# Patient Record
Sex: Male | Born: 1960 | Race: White | Hispanic: No | Marital: Single | Smoking: Current every day smoker
Health system: Southern US, Community
[De-identification: ages and names within clinical notes are randomized; demographics above are authoritative.]

## PROBLEM LIST (undated history)

## (undated) DIAGNOSIS — R0600 Dyspnea, unspecified: Secondary | ICD-10-CM

## (undated) DIAGNOSIS — F32A Depression, unspecified: Secondary | ICD-10-CM

## (undated) DIAGNOSIS — G709 Myoneural disorder, unspecified: Secondary | ICD-10-CM

## (undated) DIAGNOSIS — R0609 Other forms of dyspnea: Secondary | ICD-10-CM

## (undated) DIAGNOSIS — F419 Anxiety disorder, unspecified: Secondary | ICD-10-CM

## (undated) DIAGNOSIS — Z720 Tobacco use: Secondary | ICD-10-CM

## (undated) DIAGNOSIS — E785 Hyperlipidemia, unspecified: Secondary | ICD-10-CM

## (undated) DIAGNOSIS — G5600 Carpal tunnel syndrome, unspecified upper limb: Secondary | ICD-10-CM

## (undated) DIAGNOSIS — I2510 Atherosclerotic heart disease of native coronary artery without angina pectoris: Secondary | ICD-10-CM

## (undated) DIAGNOSIS — C801 Malignant (primary) neoplasm, unspecified: Secondary | ICD-10-CM

## (undated) DIAGNOSIS — M1990 Unspecified osteoarthritis, unspecified site: Secondary | ICD-10-CM

## (undated) DIAGNOSIS — F329 Major depressive disorder, single episode, unspecified: Secondary | ICD-10-CM

## (undated) DIAGNOSIS — R7303 Prediabetes: Secondary | ICD-10-CM

## (undated) DIAGNOSIS — F988 Other specified behavioral and emotional disorders with onset usually occurring in childhood and adolescence: Secondary | ICD-10-CM

## (undated) DIAGNOSIS — J449 Chronic obstructive pulmonary disease, unspecified: Secondary | ICD-10-CM

## (undated) HISTORY — DX: Atherosclerotic heart disease of native coronary artery without angina pectoris: I25.10

## (undated) HISTORY — PX: COLONOSCOPY: SHX174

## (undated) HISTORY — DX: Myoneural disorder, unspecified: G70.9

## (undated) HISTORY — DX: Other forms of dyspnea: R06.09

## (undated) HISTORY — PX: CHOLECYSTECTOMY: SHX55

## (undated) HISTORY — DX: Malignant (primary) neoplasm, unspecified: C80.1

## (undated) HISTORY — DX: Carpal tunnel syndrome, unspecified upper limb: G56.00

## (undated) HISTORY — DX: Hyperlipidemia, unspecified: E78.5

## (undated) HISTORY — DX: Tobacco use: Z72.0

---

## 1998-02-08 ENCOUNTER — Emergency Department (HOSPITAL_COMMUNITY): Admission: EM | Admit: 1998-02-08 | Discharge: 1998-02-08 | Payer: Self-pay | Admitting: *Deleted

## 1998-07-18 ENCOUNTER — Emergency Department (HOSPITAL_COMMUNITY): Admission: EM | Admit: 1998-07-18 | Discharge: 1998-07-18 | Payer: Self-pay | Admitting: Emergency Medicine

## 1999-02-13 ENCOUNTER — Ambulatory Visit (HOSPITAL_BASED_OUTPATIENT_CLINIC_OR_DEPARTMENT_OTHER): Admission: RE | Admit: 1999-02-13 | Discharge: 1999-02-13 | Payer: Self-pay | Admitting: Plastic Surgery

## 1999-04-30 ENCOUNTER — Ambulatory Visit (HOSPITAL_COMMUNITY): Admission: RE | Admit: 1999-04-30 | Discharge: 1999-04-30 | Payer: Self-pay | Admitting: Gastroenterology

## 1999-06-20 ENCOUNTER — Encounter: Admission: RE | Admit: 1999-06-20 | Discharge: 1999-06-20 | Payer: Self-pay | Admitting: Family Medicine

## 1999-06-20 ENCOUNTER — Encounter: Payer: Self-pay | Admitting: Family Medicine

## 1999-07-11 ENCOUNTER — Ambulatory Visit: Admission: RE | Admit: 1999-07-11 | Discharge: 1999-07-11 | Payer: Self-pay | Admitting: Pulmonary Disease

## 2001-07-29 HISTORY — PX: GALLBLADDER SURGERY: SHX652

## 2003-05-17 ENCOUNTER — Encounter: Payer: Self-pay | Admitting: Emergency Medicine

## 2003-05-17 ENCOUNTER — Emergency Department (HOSPITAL_COMMUNITY): Admission: EM | Admit: 2003-05-17 | Discharge: 2003-05-18 | Payer: Self-pay | Admitting: Emergency Medicine

## 2003-11-01 ENCOUNTER — Encounter: Admission: RE | Admit: 2003-11-01 | Discharge: 2003-11-01 | Payer: Self-pay | Admitting: Specialist

## 2004-09-04 ENCOUNTER — Encounter: Admission: RE | Admit: 2004-09-04 | Discharge: 2004-09-04 | Payer: Self-pay | Admitting: Family Medicine

## 2004-10-30 ENCOUNTER — Ambulatory Visit (HOSPITAL_COMMUNITY): Admission: RE | Admit: 2004-10-30 | Discharge: 2004-10-30 | Payer: Self-pay | Admitting: Surgery

## 2013-01-31 ENCOUNTER — Encounter (HOSPITAL_COMMUNITY): Payer: Self-pay | Admitting: Emergency Medicine

## 2013-01-31 ENCOUNTER — Emergency Department (HOSPITAL_COMMUNITY)
Admission: EM | Admit: 2013-01-31 | Discharge: 2013-02-01 | Disposition: A | Discharge status: Other Acute Inpatient Hospital | Payer: No Typology Code available for payment source | Attending: Emergency Medicine | Admitting: Emergency Medicine

## 2013-01-31 DIAGNOSIS — F3130 Bipolar disorder, current episode depressed, mild or moderate severity, unspecified: Secondary | ICD-10-CM

## 2013-01-31 DIAGNOSIS — F1110 Opioid abuse, uncomplicated: Secondary | ICD-10-CM | POA: Insufficient documentation

## 2013-01-31 DIAGNOSIS — T394X2A Poisoning by antirheumatics, not elsewhere classified, intentional self-harm, initial encounter: Secondary | ICD-10-CM

## 2013-01-31 DIAGNOSIS — F19239 Other psychoactive substance dependence with withdrawal, unspecified: Secondary | ICD-10-CM

## 2013-01-31 DIAGNOSIS — F17200 Nicotine dependence, unspecified, uncomplicated: Secondary | ICD-10-CM | POA: Insufficient documentation

## 2013-01-31 DIAGNOSIS — F32A Depression, unspecified: Secondary | ICD-10-CM

## 2013-01-31 DIAGNOSIS — R509 Fever, unspecified: Secondary | ICD-10-CM | POA: Insufficient documentation

## 2013-01-31 DIAGNOSIS — T398X2A Poisoning by other nonopioid analgesics and antipyretics, not elsewhere classified, intentional self-harm, initial encounter: Secondary | ICD-10-CM

## 2013-01-31 DIAGNOSIS — R109 Unspecified abdominal pain: Secondary | ICD-10-CM | POA: Insufficient documentation

## 2013-01-31 DIAGNOSIS — F329 Major depressive disorder, single episode, unspecified: Secondary | ICD-10-CM | POA: Insufficient documentation

## 2013-01-31 DIAGNOSIS — F3289 Other specified depressive episodes: Secondary | ICD-10-CM | POA: Insufficient documentation

## 2013-01-31 DIAGNOSIS — M549 Dorsalgia, unspecified: Secondary | ICD-10-CM | POA: Insufficient documentation

## 2013-01-31 DIAGNOSIS — R197 Diarrhea, unspecified: Secondary | ICD-10-CM | POA: Insufficient documentation

## 2013-01-31 DIAGNOSIS — T401X4A Poisoning by heroin, undetermined, initial encounter: Secondary | ICD-10-CM

## 2013-01-31 DIAGNOSIS — F1120 Opioid dependence, uncomplicated: Secondary | ICD-10-CM

## 2013-01-31 DIAGNOSIS — R45851 Suicidal ideations: Secondary | ICD-10-CM | POA: Insufficient documentation

## 2013-01-31 DIAGNOSIS — R110 Nausea: Secondary | ICD-10-CM | POA: Insufficient documentation

## 2013-01-31 HISTORY — DX: Depression, unspecified: F32.A

## 2013-01-31 HISTORY — DX: Major depressive disorder, single episode, unspecified: F32.9

## 2013-01-31 LAB — CBC WITH DIFFERENTIAL/PLATELET
Basophils Absolute: 0 10*3/uL (ref 0.0–0.1)
Eosinophils Relative: 0 % (ref 0–5)
Lymphocytes Relative: 20 % (ref 12–46)
MCV: 85.5 fL (ref 78.0–100.0)
Neutro Abs: 8.4 10*3/uL — ABNORMAL HIGH (ref 1.7–7.7)
Neutrophils Relative %: 73 % (ref 43–77)
Platelets: 380 10*3/uL (ref 150–400)
RDW: 13.5 % (ref 11.5–15.5)
WBC: 11.4 10*3/uL — ABNORMAL HIGH (ref 4.0–10.5)

## 2013-01-31 LAB — BASIC METABOLIC PANEL
CO2: 24 mEq/L (ref 19–32)
Calcium: 9.7 mg/dL (ref 8.4–10.5)
Potassium: 3.3 mEq/L — ABNORMAL LOW (ref 3.5–5.1)
Sodium: 133 mEq/L — ABNORMAL LOW (ref 135–145)

## 2013-01-31 LAB — ETHANOL: Alcohol, Ethyl (B): <11 mg/dL (ref 0–11)

## 2013-01-31 MED ORDER — LOPERAMIDE HCL 2 MG PO CAPS: 2.0000 mg | ORAL_CAPSULE | ORAL | Status: DC | PRN

## 2013-01-31 MED ORDER — LOPERAMIDE HCL 2 MG PO CAPS
2.0000 mg | ORAL_CAPSULE | Freq: Once | ORAL | Status: AC
  Administered 2013-01-31: 2 mg via ORAL
  Filled 2013-01-31: qty 1

## 2013-01-31 MED ORDER — DICYCLOMINE HCL 10 MG PO CAPS
10.0000 mg | ORAL_CAPSULE | Freq: Once | ORAL | Status: AC
  Administered 2013-01-31: 10 mg via ORAL
  Filled 2013-01-31: qty 1

## 2013-01-31 MED ORDER — CLONIDINE HCL 0.1 MG PO TABS: 0.1000 mg | ORAL_TABLET | ORAL | Status: DC

## 2013-01-31 MED ORDER — HYDROXYZINE HCL 25 MG PO TABS
25.0000 mg | ORAL_TABLET | Freq: Four times a day (QID) | ORAL | Status: DC | PRN
  Administered 2013-01-31 – 2013-02-01 (×2): 25 mg via ORAL
  Filled 2013-01-31 (×2): qty 1

## 2013-01-31 MED ORDER — CLONIDINE HCL 0.1 MG PO TABS
0.1000 mg | ORAL_TABLET | Freq: Once | ORAL | Status: AC
  Administered 2013-01-31: 0.1 mg via ORAL
  Filled 2013-01-31: qty 1

## 2013-01-31 MED ORDER — NAPROXEN 500 MG PO TABS
500.0000 mg | ORAL_TABLET | Freq: Two times a day (BID) | ORAL | Status: DC | PRN
  Filled 2013-01-31: qty 1

## 2013-01-31 MED ORDER — ONDANSETRON 4 MG PO TBDP
4.0000 mg | ORAL_TABLET | Freq: Four times a day (QID) | ORAL | Status: DC | PRN
  Administered 2013-02-01 (×2): 4 mg via ORAL
  Filled 2013-01-31 (×2): qty 1

## 2013-01-31 MED ORDER — METHOCARBAMOL 500 MG PO TABS
500.0000 mg | ORAL_TABLET | Freq: Three times a day (TID) | ORAL | Status: DC | PRN
  Administered 2013-01-31 – 2013-02-01 (×2): 500 mg via ORAL
  Filled 2013-01-31 (×2): qty 1

## 2013-01-31 MED ORDER — POTASSIUM CHLORIDE CRYS ER 20 MEQ PO TBCR
30.0000 meq | EXTENDED_RELEASE_TABLET | Freq: Once | ORAL | Status: AC
  Administered 2013-01-31: 30 meq via ORAL
  Filled 2013-01-31: qty 1.5
  Filled 2013-01-31: qty 1

## 2013-01-31 MED ORDER — CLONIDINE HCL 0.1 MG PO TABS
0.1000 mg | ORAL_TABLET | Freq: Every day | ORAL | Status: DC
  Administered 2013-02-01: 0.1 mg via ORAL

## 2013-01-31 MED ORDER — DICYCLOMINE HCL 20 MG PO TABS
20.0000 mg | ORAL_TABLET | Freq: Four times a day (QID) | ORAL | Status: DC | PRN
  Administered 2013-02-01 (×3): 20 mg via ORAL
  Filled 2013-01-31 (×4): qty 1

## 2013-01-31 MED ORDER — CLONIDINE HCL 0.1 MG PO TABS
0.1000 mg | ORAL_TABLET | Freq: Four times a day (QID) | ORAL | Status: DC
  Administered 2013-01-31 – 2013-02-01 (×3): 0.1 mg via ORAL
  Filled 2013-01-31 (×4): qty 1

## 2013-01-31 NOTE — [facility] Counselor (Addendum)
Pt has given verbal consent for pt's sister Verl Blalock to be kept apprised of his disposition. Her cell is [phone]. Sister asked that staff call her when pt disposition set.   Writer answered sister's questions re: heroin addiction, bipolar disorder and possible course of treatment for pt.   Evette Cristal, [place] Assessment Counselor

## 2013-01-31 NOTE — ED Notes (Addendum)
Pt from home via [facility] c/o withdrawals from IV heroin. Pt last used approx 2-3 days ago. Pt states that he is trying to quit "cold [place]". Pt reports dry mouth, incontinent of stool, generalized body aches, tremors, chills. Pt requesting help with detox. Pt A&O and in NAD. VSS

## 2013-01-31 NOTE — ED Provider Notes (Signed)
Medical screening examination/treatment/procedure(s) were performed by non-physician practitioner and as supervising physician I was immediately available for consultation/collaboration.  Ethelda Chick, MD 01/31/13 [phone]

## 2013-01-31 NOTE — ED Provider Notes (Signed)
History  This chart was scribed for  by Ladona Ridgel Day, ED scribe. This patient was seen in room [facility]TR1/[facility]PT1 and the patient's care was started at 1548.  CSN: [id number] Arrival date & time 01/31/13  1526  First MD Initiated Contact with Patient 01/31/13 1548     Chief Complaint  Patient presents with  . Drug Problem   Patient is a 52 y.o. male presenting with drug problem. The history is provided by the patient. No language interpreter was used.  Drug Problem This is a chronic problem. The current episode started more than 1 week ago. The problem occurs constantly. The problem has not changed since onset.Associated symptoms include abdominal pain. Pertinent negatives include no chest pain and no shortness of breath. Nothing aggravates the symptoms. Nothing relieves the symptoms. He has tried nothing for the symptoms.  Drug Problem This is a chronic problem. The current episode started more than 1 week ago. The problem occurs constantly. The problem has not changed since onset.Associated symptoms include abdominal pain, chills, a fever and nausea. Pertinent negatives include no chest pain, coughing, neck pain, vomiting or weakness. Nothing aggravates the symptoms. He has tried nothing for the symptoms.   HPI Comments: Louis Zimmerman is a 52 y.o. male who presents to the Emergency Department requesting detox for heroin abuse and admits to using for the past year. He states past 3 days has been trying to quit on his own but with worsening withdrawal sx including nausea, diarrhea, chills, body aches, fever. He states has not tried to quit before and is now feeding depressed with suicidal ideations; he denies homicidal ideations. He denies hallucinations or hearing voices. He has no medical history.    Past Medical History  Diagnosis Date  . Depression    History reviewed. No pertinent past surgical history. No family history on file. History  Substance Use Topics  . Smoking status: Current  Every Day Smoker -- 1.00 packs/day    Types: Cigarettes  . Smokeless tobacco: Not on file  . Alcohol Use: No    Review of Systems  Constitutional: Positive for fever and chills.       Body aches from heroin withdrawal  HENT: Negative for neck pain.   Respiratory: Negative for cough and shortness of breath.   Cardiovascular: Negative for chest pain.  Gastrointestinal: Positive for nausea, abdominal pain and diarrhea. Negative for vomiting.  Musculoskeletal: Negative for back pain.  Skin: Negative for color change and pallor.  Neurological: Negative for syncope and weakness.  Psychiatric/Behavioral: Positive for suicidal ideas. Negative for hallucinations and self-injury. The patient is nervous/anxious.   All other systems reviewed and are negative.   A complete 10 system review of systems was obtained and all systems are negative except as noted in the HPI and PMH.   Allergies  Review of patient's allergies indicates no known allergies.  Home Medications  No current outpatient prescriptions on file. Triage Vitals: BP 121/73  Pulse 95  Temp(Src) 98.4 F (36.9 C) (Oral)  Resp 20  SpO2 99% Physical Exam  Nursing note and vitals reviewed. Constitutional: He is oriented to person, place, and time. He appears well-developed and well-nourished. No distress.  HENT:  Head: Normocephalic and atraumatic.  Eyes: EOM are normal.  Neck: Neck supple. No tracheal deviation present.  Cardiovascular: Normal rate.   Pulmonary/Chest: Effort normal. No respiratory distress.  Musculoskeletal: Normal range of motion.  Neurological: He is alert and oriented to person, place, and time.  Skin: Skin is warm  and dry.  Psychiatric: He is withdrawn. He is not actively hallucinating. Thought content is not paranoid and not delusional. He exhibits a depressed mood. He expresses suicidal ideation. He expresses no homicidal ideation. He expresses no suicidal plans and no homicidal plans.    ED Course   Procedures (including critical care time)  DIAGNOSTIC STUDIES: Oxygen Saturation is 99% on room air, normal by my interpretation.    COORDINATION OF CARE: At 357 Pm Discussed treatment plan with patient which includes anti anxiety medicines, imodium, robaxin. Patient agrees.   Labs Reviewed  CBC WITH DIFFERENTIAL  BASIC METABOLIC PANEL  URINE RAPID DRUG SCREEN (HOSP PERFORMED)  ETHANOL   No results found. 1. Heroin abuse     MDM  Discussed case with ACT. Holding orders placed. Opiate withdrawal protocol initiated. Home meds reviewed.. Labs drawn.   Dorthula Matas, PA-C 01/31/13 1605

## 2013-01-31 NOTE — ED Notes (Signed)
Pt asked to given urine specimen but can't void at present moment.

## 2013-01-31 NOTE — ED Notes (Signed)
Pt BIB EMS. Pt states he has used heroin every day for the past year. Pt states he stopped using 2 days ago. Pt c/o abd pain, diarrhea, fever and body aches over the past 3 or 4 days. Pt was ambulatory to ambulance with steady gait per EMS. Pt a/o x 4. No acute distress.

## 2013-01-31 NOTE — Consult Note (Signed)
Reason for Consult:ED admission for opiate  (heroin) W/D with hx attempted suicide by OD .  Referring Physician:Dr Zimmerman   Louis Zimmerman is an 52 y.o. male.  HPI:[name] admitted to ED in Heroin withdrawal s/p attempted OD 7/4 and subsequent cold [place].  8 yr hx opiate abuse/addiction to RX pain pills related to shoulder injury/chronic pains prior to past yr using heroin at suggestion of a friend who told him it was "better" than the pain pills.Pt used heroin iv-DID NOT SHARE NEEDLES BUT DID NOT CLEAN THEM IN BEWEEN DOSES.Sister he allowed to be present reports pt had dx of Bipolar DO in past with Louis Zimmerman whom pt has not seen in years. Pt reports he is depressed without any mania at this time. He states he was dx'd prior to his addiction.  Past Medical History  Diagnosis Date  . Depression     History reviewed. No pertinent past surgical history.  No family history on file.  Social History:  reports that he has been smoking Cigarettes.  He has been smoking about 1.00 pack per day. He does not have any smokeless tobacco history on file. He reports that he uses illicit drugs (IV) about 7 times per week. He reports that he does not drink alcohol.  Allergies: No Known Allergies  Medications: Prior to Admission:  (Not in a hospital admission)  Results for orders placed during the hospital encounter of 01/31/13 (from the past 48 hour(s))  CBC WITH DIFFERENTIAL     Status: Abnormal   Collection Time    01/31/13  4:27 PM      Result Value Range   WBC 11.4 (*) 4.0 - 10.5 K/uL   RBC 4.42  4.22 - 5.81 MIL/uL   Hemoglobin 13.2  13.0 - 17.0 g/dL   HCT 16.1 (*) 09.6 - 04.5 %   MCV 85.5  78.0 - 100.0 fL   MCH 29.9  26.0 - 34.0 pg   MCHC 34.9  30.0 - 36.0 g/dL   RDW 40.9  81.1 - 91.4 %   Platelets 380  150 - 400 K/uL   Neutrophils Relative % 73  43 - 77 %   Neutro Abs 8.4 (*) 1.7 - 7.7 K/uL   Lymphocytes Relative 20  12 - 46 %   Lymphs Abs 2.2  0.7 - 4.0 K/uL   Monocytes  Relative 7  3 - 12 %   Monocytes Absolute 0.8  0.1 - 1.0 K/uL   Eosinophils Relative 0  0 - 5 %   Eosinophils Absolute 0.0  0.0 - 0.7 K/uL   Basophils Relative 0  0 - 1 %   Basophils Absolute 0.0  0.0 - 0.1 K/uL  BASIC METABOLIC PANEL     Status: Abnormal   Collection Time    01/31/13  4:27 PM      Result Value Range   Sodium 133 (*) 135 - 145 mEq/L   Potassium 3.3 (*) 3.5 - 5.1 mEq/L   Chloride 97  96 - 112 mEq/L   CO2 24  19 - 32 mEq/L   Glucose, Bld 113 (*) 70 - 99 mg/dL   BUN 11  6 - 23 mg/dL   Creatinine, Ser 7.82  0.50 - 1.35 mg/dL   Calcium 9.7  8.4 - 95.6 mg/dL   GFR calc non Af Amer >90  >90 mL/min   GFR calc Af Amer >90  >90 mL/min   Comment:  The eGFR has been calculated     using the CKD EPI equation.     This calculation has not been     validated in all clinical     situations.     eGFR's persistently     <90 mL/min signify     possible Chronic Kidney Disease.  ETHANOL     Status: None   Collection Time    01/31/13  4:27 PM      Result Value Range   Alcohol, Ethyl (B) <11  0 - 11 mg/dL   Comment:            LOWEST DETECTABLE LIMIT FOR     SERUM ALCOHOL IS 11 mg/dL     FOR MEDICAL PURPOSES ONLY    No results found.  Review of Systems  Constitutional: Positive for chills, weight loss, malaise/fatigue and diaphoresis.  HENT:       Rhinorrhea  Eyes: Negative.        Pupils constricted  Respiratory: Negative.   Cardiovascular: Negative.   Gastrointestinal: Positive for nausea, abdominal pain and diarrhea. Negative for vomiting, blood in stool and melena.  Genitourinary: Negative.   Musculoskeletal: Positive for myalgias, back pain and joint pain. Negative for falls.       Reports he began using opiates with bilateral shoulder pain/ injury in distant past-increasing dosages over time and eventually buying them off street. A friend told him heroin would work better and was cheaper at the time. 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   Endo/Heme/Allergies: Negative.   Psychiatric/Behavioral: Positive for suicidal ideas and substance abuse. The patient is nervous/anxious.        IV heroin use -didd not share needles but did not clean needles in between use  Pt gives hx of bipolar disorder diagnosed before he began to use opiates.Reports depression.Reports suicide attempt by overdose July 4  Hasnt taken bipolar meds nor seen Louis Zimmerman in years-reports he lost his health insurance in 2008 due to economy    Blood pressure 121/68, pulse 66, temperature 98.3 F (36.8 C), temperature source Oral, resp. rate 20, SpO2 100.00%. Physical Exam  Constitutional: He is oriented to person, place, and time. He appears distressed.  Lying in bed in fetal position with c/o abdominal pains/chills on COWS opiate W/D protocol  HENT:  Head: Normocephalic and atraumatic.  Right Ear: External ear normal.  Left Ear: External ear normal.  Rhinorrhea  Eyes: Conjunctivae and EOM are normal. Pupils are equal, round, and reactive to light. Right eye exhibits no discharge. Left eye exhibits no discharge.  Pupils constricted  Neck: Normal range of motion. Neck supple.  Cardiovascular: Regular rhythm.   Tachycardic  Respiratory: Effort normal and breath sounds normal.  GI:  Cramping  Genitourinary:  Deferred  Musculoskeletal:  Generalized myalgias  Neurological: He is alert and oriented to person, place, and time. No cranial nerve deficit.  Skin: He is diaphoretic.  Piloerection presenet  Psychiatric:  Behavior- consistent with heroin withdrawal Orientation-x3 Affect-sick/sad/depressed Mood-Consistent with affect Perception-aware of his addiction-unaware of                                        solutions.Judgement is impaired especially  given his attempted OD                                      Thought-Comprehensible.Speech is slow /tone is soft COWS 15    Assessment/Plan: Axis I: Opiate dependence in acute withdrawal                                           Bipolar d/o with Major Depression and suicide attempt complicated by heroin addiction (substance induced mood DO)                                Axis II: Deferred                                Axis III: Hx of bilateral Shoulder injury with chronic pain                                            Hyponatremia/hypokalemia probably secondary to fluid loss from W/D (Diarrhea)                                            Elevated WBC-?etiology                                 Axis FA:OZHYQMVHQ/IONGEX stressors                                 Axis V: GAF 29 PLAN: Admit to [name] service when bed available              Stabilize fluids/electrolytes in ER              Monitor WBC/FU on source of elevation  Michale Weikel E 01/31/2013, 8:48 PM

## 2013-01-31 NOTE — BH Assessment (Signed)
Assessment Note   Louis Zimmerman is an 52 y.o. male.   Pt has been using heroin for 1 yr on a daily basis with the amount varying depending on what the pt could get.  Pt denies current SI/Hi and AVH.  Pt reports he tried to end life 3 days ago by OD but no one knew and there was no help sought.  Pt reports knowing that detox off Heroin was going to be hard and did not want to live through it.  Pt says "I failed and I guess I got to go through it now."  Pt is curled up on his side.  Pt COWS appears to be 15.  Pt has vomited, has chills, is restless, sensitive to light, tremulous, Pulse is 95, pt rocking back and forth.  Pt appears to be in a lot of discomfort.  Pt has detoxed self for 3 days prior to coming to ED.  MD do not believe pt needs medical admit.  However, [facility] detox won't accept pt in current state and by the time pt stabilizes he will benefit more from rehab than detox.  Pt reports depression and hx of depression.  Pt appears to have some hx of psych related issues.  Nurses indicate pt family may not know about heroin use.  They have asked about prescription pill abuse.  ED has not discussed with family due to confidentiality.  ACT asked pt if family knew he was using heroin and pt responded "Yes."   MSE by ACT:  No eye contact, loss of weight, shaking and restless, Ox2, high anxiety, tremulous, chills, vomiting, nausea.  Axis I: Bipolar, Depressed and Opiod Abuse & MDD Axis II: Deferred Axis III:  Past Medical History  Diagnosis Date  . Depression    Axis IV: other psychosocial or environmental problems and problems related to social environment Axis V: 41-50 serious symptoms  Past Medical History:  Past Medical History  Diagnosis Date  . Depression     History reviewed. No pertinent past surgical history.  Family History: No family history on file.  Social History:  reports that he has been smoking Cigarettes.  He has been smoking about 1.00 pack per day. He does  not have any smokeless tobacco history on file. He reports that he uses illicit drugs (IV) about 7 times per week. He reports that he does not drink alcohol.  Additional Social History:  Alcohol / Drug Use Pain Medications: na Prescriptions: na Over the Counter: na History of alcohol / drug use?: Yes Substance #1 Name of Substance 1: herion 1 - Age of First Use: 51 1 - Amount (size/oz): varies 1 - Frequency: daily 1 - Duration: 1 yr 1 - Last Use / Amount: 01-28-13  CIWA: CIWA-Ar BP: 121/73 mmHg Pulse Rate: 95 COWS: Clinical Opiate Withdrawal Scale (COWS) Resting Pulse Rate: Pulse Rate 81-100 Sweating: Subjective report of chills or flushing Restlessness: Frequent shifting or extraneous movements of legs/arms Pupil Size: Pupils possibly larger than normal for room light Bone or Joint Aches: Patient reports sever diffuse aching of joints/muscles Runny Nose or Tearing: Not present GI Upset: Vomiting or diarrhea Tremor: Tremor can be felt, but not observed Yawning: Yawning once or twice during assessment Anxiety or Irritability: Patient obviously irritable/anxious Gooseflesh Skin: Skin is smooth COWS Total Score: 15  Allergies: No Known Allergies  Home Medications:  (Not in a hospital admission)  OB/GYN Status:  No LMP for male patient.  General Assessment Data Location of Assessment: [facility]  ED Can pt return to current living arrangement?: Yes Admission Status: Voluntary Is patient capable of signing voluntary admission?: Yes Transfer from: Acute Hospital Referral Source: MD  Education Status Is patient currently in school?: No Current Grade: na Highest grade of school patient has completed: na Name of school: na Contact person: na  Risk to self Suicidal Ideation: No-Not Currently/Within Last 6 Months Suicidal Intent: No-Not Currently/Within Last 6 Months Is patient at risk for suicide?: No Suicidal Plan?: No-Not Currently/Within Last 6 Months Access to Means:  No What has been your use of drugs/alcohol within the last 12 months?: herion Previous Attempts/Gestures: Yes How many times?: 1 Other Self Harm Risks: na Triggers for Past Attempts: Other (Comment) Intentional Self Injurious Behavior: None Family Suicide History: No Recent stressful life event(s): Other (Comment) (SA issues) Persecutory voices/beliefs?: No Depression: Yes Depression Symptoms: Isolating;Fatigue;Guilt;Loss of interest in usual pleasures;Feeling worthless/self pity;Feeling angry/irritable Substance abuse history and/or treatment for substance abuse?: No Suicide prevention information given to non-admitted patients: Not applicable  Risk to Others Homicidal Ideation: No Thoughts of Harm to Others: No Current Homicidal Intent: No Current Homicidal Plan: No Access to Homicidal Means: No Identified Victim: na History of harm to others?: No Assessment of Violence: None Noted Violent Behavior Description: cooperative Does patient have access to weapons?: No Criminal Charges Pending?: No Does patient have a court date: No  Psychosis Hallucinations: None noted Delusions: None noted  Mental Status Report Appear/Hygiene: Disheveled Eye Contact: Poor Motor Activity: Restlessness Speech: Soft;Logical/coherent Level of Consciousness: Alert Mood: Depressed;Anxious;Irritable;Sad;Worthless, low self-esteem Affect: Anxious;Blunted;Appropriate to circumstance;Depressed;Sad Anxiety Level: Moderate Thought Processes: Coherent Judgement: Unimpaired Orientation: Person;Place;Situation Obsessive Compulsive Thoughts/Behaviors: Minimal  Cognitive Functioning Concentration: Decreased Memory: Recent Intact;Remote Intact IQ: Average Insight: Poor Impulse Control: Poor Appetite: Poor Weight Loss: 5 Weight Gain: 0 Sleep: Decreased Total Hours of Sleep: 2 Vegetative Symptoms: None  ADLScreening [facility]) Patient's cognitive ability adequate to safely  complete daily activities?: Yes Patient able to express need for assistance with ADLs?: Yes Independently performs ADLs?: Yes (appropriate for developmental age)  Abuse/Neglect [facility]) Physical Abuse: Denies Verbal Abuse: Denies Sexual Abuse: Denies  Prior Inpatient Therapy Prior Inpatient Therapy: No Prior Therapy Dates: na Prior Therapy Facilty/Provider(s): na Reason for Treatment: na  Prior Outpatient Therapy Prior Outpatient Therapy: No Prior Therapy Dates: na Prior Therapy Facilty/Provider(s): na Reason for Treatment: na  ADL Screening (condition at time of admission) Patient's cognitive ability adequate to safely complete daily activities?: Yes Is the patient deaf or have difficulty hearing?: Yes Does the patient have difficulty seeing, even when wearing glasses/contacts?: Yes Does the patient have difficulty concentrating, remembering, or making decisions?: Yes Patient able to express need for assistance with ADLs?: Yes Does the patient have difficulty dressing or bathing?: Yes Independently performs ADLs?: Yes (appropriate for developmental age) Does the patient have difficulty walking or climbing stairs?: Yes Weakness of Legs: None Weakness of Arms/Hands: None  Home Assistive Devices/Equipment Home Assistive Devices/Equipment: None  Therapy Consults (therapy consults require a physician order) PT Evaluation Needed: No OT Evalulation Needed: No SLP Evaluation Needed: No Abuse/Neglect Assessment (Assessment to be complete while patient is alone) Physical Abuse: Denies Verbal Abuse: Denies Sexual Abuse: Denies Exploitation of patient/patient's resources: Denies Self-Neglect: Denies Values / Beliefs Cultural Requests During Hospitalization: None Spiritual Requests During Hospitalization: None Consults Spiritual Care Consult Needed: No Social Work Consult Needed: No Merchant navy officer (For Healthcare) Advance Directive: Patient does not have advance  directive Pre-existing out of facility DNR order (yellow form or pink MOST  form): No    Additional Information 1:1 In Past 12 Months?: No CIRT Risk: No Elopement Risk: No Does patient have medical clearance?: No     Disposition:  Disposition Initial Assessment Completed for this Encounter: Yes Disposition of Patient: Inpatient treatment program Type of inpatient treatment program: Adult  On Site Evaluation by:   Reviewed with Physician:     Macon Large 01/31/2013 5:33 PM

## 2013-01-31 NOTE — ED Notes (Signed)
Pt calm and cooperative; pt denies SI, HI and hallucinations.  Pt reports he is continuing to have ABD pain and cramping.

## 2013-02-01 ENCOUNTER — Inpatient Hospital Stay (HOSPITAL_COMMUNITY)
Admission: AD | Admit: 2013-02-01 | Discharge: 2013-02-08 | DRG: 897 | Disposition: A | Discharge status: Home / Self Care | Payer: No Typology Code available for payment source | Source: Intra-hospital | Attending: Psychiatry | Admitting: Psychiatry

## 2013-02-01 ENCOUNTER — Encounter (HOSPITAL_COMMUNITY): Payer: Self-pay | Admitting: *Deleted

## 2013-02-01 DIAGNOSIS — F1120 Opioid dependence, uncomplicated: Secondary | ICD-10-CM

## 2013-02-01 DIAGNOSIS — F1193 Opioid use, unspecified with withdrawal: Secondary | ICD-10-CM

## 2013-02-01 DIAGNOSIS — F3289 Other specified depressive episodes: Secondary | ICD-10-CM | POA: Diagnosis present

## 2013-02-01 DIAGNOSIS — F329 Major depressive disorder, single episode, unspecified: Secondary | ICD-10-CM | POA: Diagnosis present

## 2013-02-01 DIAGNOSIS — F19239 Other psychoactive substance dependence with withdrawal, unspecified: Principal | ICD-10-CM | POA: Diagnosis present

## 2013-02-01 DIAGNOSIS — F1920 Other psychoactive substance dependence, uncomplicated: Secondary | ICD-10-CM | POA: Diagnosis present

## 2013-02-01 DIAGNOSIS — F17200 Nicotine dependence, unspecified, uncomplicated: Secondary | ICD-10-CM | POA: Diagnosis present

## 2013-02-01 DIAGNOSIS — F19939 Other psychoactive substance use, unspecified with withdrawal, unspecified: Principal | ICD-10-CM | POA: Diagnosis present

## 2013-02-01 DIAGNOSIS — G8929 Other chronic pain: Secondary | ICD-10-CM | POA: Diagnosis present

## 2013-02-01 DIAGNOSIS — F32A Depression, unspecified: Secondary | ICD-10-CM

## 2013-02-01 LAB — CBC
Platelets: 404 10*3/uL — ABNORMAL HIGH (ref 150–400)
RBC: 4.26 MIL/uL (ref 4.22–5.81)
RDW: 13.6 % (ref 11.5–15.5)
WBC: 12.3 10*3/uL — ABNORMAL HIGH (ref 4.0–10.5)

## 2013-02-01 MED ORDER — CLONIDINE HCL 0.1 MG PO TABS
0.1000 mg | ORAL_TABLET | ORAL | Status: AC
  Administered 2013-02-04 – 2013-02-05 (×4): 0.1 mg via ORAL
  Filled 2013-02-01 (×4): qty 1

## 2013-02-01 MED ORDER — HYDROXYZINE HCL 25 MG PO TABS
25.0000 mg | ORAL_TABLET | Freq: Four times a day (QID) | ORAL | Status: DC | PRN
  Administered 2013-02-01 – 2013-02-06 (×6): 25 mg via ORAL
  Filled 2013-02-01: qty 1

## 2013-02-01 MED ORDER — NICOTINE 21 MG/24HR TD PT24
21.0000 mg | MEDICATED_PATCH | Freq: Every day | TRANSDERMAL | Status: DC
  Administered 2013-02-01 – 2013-02-08 (×7): 21 mg via TRANSDERMAL
  Filled 2013-02-01 (×11): qty 1

## 2013-02-01 MED ORDER — ACETAMINOPHEN 325 MG PO TABS: 650.0000 mg | ORAL_TABLET | Freq: Four times a day (QID) | ORAL | Status: DC | PRN

## 2013-02-01 MED ORDER — CLONIDINE HCL 0.1 MG PO TABS
0.1000 mg | ORAL_TABLET | Freq: Four times a day (QID) | ORAL | Status: AC
  Administered 2013-02-01 – 2013-02-03 (×4): 0.1 mg via ORAL
  Filled 2013-02-01 (×11): qty 1

## 2013-02-01 MED ORDER — MAGNESIUM HYDROXIDE 400 MG/5ML PO SUSP: 30.0000 mL | Freq: Every day | ORAL | Status: DC | PRN

## 2013-02-01 MED ORDER — CLONIDINE HCL 0.1 MG PO TABS
0.1000 mg | ORAL_TABLET | Freq: Every day | ORAL | Status: AC
  Administered 2013-02-06 – 2013-02-07 (×2): 0.1 mg via ORAL
  Filled 2013-02-01 (×2): qty 1

## 2013-02-01 MED ORDER — ONDANSETRON 4 MG PO TBDP: 4.0000 mg | ORAL_TABLET | Freq: Four times a day (QID) | ORAL | Status: AC | PRN

## 2013-02-01 MED ORDER — LOPERAMIDE HCL 2 MG PO CAPS: 2.0000 mg | ORAL_CAPSULE | ORAL | Status: AC | PRN

## 2013-02-01 MED ORDER — NAPROXEN 500 MG PO TABS
500.0000 mg | ORAL_TABLET | Freq: Two times a day (BID) | ORAL | Status: AC | PRN
  Administered 2013-02-01 – 2013-02-03 (×4): 500 mg via ORAL
  Filled 2013-02-01 (×4): qty 1

## 2013-02-01 MED ORDER — METHOCARBAMOL 500 MG PO TABS
500.0000 mg | ORAL_TABLET | Freq: Three times a day (TID) | ORAL | Status: AC | PRN
  Administered 2013-02-01 – 2013-02-02 (×3): 500 mg via ORAL
  Filled 2013-02-01 (×3): qty 1

## 2013-02-01 MED ORDER — TRAZODONE HCL 50 MG PO TABS
50.0000 mg | ORAL_TABLET | Freq: Every evening | ORAL | Status: DC | PRN
  Administered 2013-02-02 – 2013-02-06 (×7): 50 mg via ORAL
  Filled 2013-02-01 (×7): qty 1

## 2013-02-01 MED ORDER — DICYCLOMINE HCL 20 MG PO TABS
20.0000 mg | ORAL_TABLET | Freq: Four times a day (QID) | ORAL | Status: AC | PRN
  Administered 2013-02-01 – 2013-02-02 (×3): 20 mg via ORAL
  Filled 2013-02-01 (×5): qty 1

## 2013-02-01 MED ORDER — ALUM & MAG HYDROXIDE-SIMETH 200-200-20 MG/5ML PO SUSP: 30.0000 mL | ORAL | Status: DC | PRN

## 2013-02-01 NOTE — Progress Notes (Signed)
Adult Psychoeducational Group Note  Date:  02/01/2013 Time:  8:00PM Group Topic/Focus:  Wrap-Up Group:   The focus of this group is to help patients review their daily goal of treatment and discuss progress on daily workbooks.  Participation Level:  Active  Participation Quality:  Appropriate and Attentive  Affect:  Appropriate  Cognitive:  Alert and Appropriate  Insight: Appropriate  Engagement in Group:  Engaged  Modes of Intervention:  Discussion  Additional Comments:  Pt. Was appropriate and attentive during tonight's wrap up AA meeting.   Bing Plume D 02/01/2013, 8:20 PM

## 2013-02-01 NOTE — Tx Team (Signed)
Initial Interdisciplinary Treatment Plan  PATIENT STRENGTHS: (choose at least two) Average or above average intelligence Capable of independent living Communication skills General fund of knowledge Supportive family/friends  PATIENT STRESSORS: Financial difficulties Marital or family conflict Substance abuse   PROBLEM LIST: Problem List/Patient Goals Date to be addressed Date deferred Reason deferred Estimated date of resolution  Opiate abuse x1 year 7/7   D/c        Occupational concerns 7/7   D/c        Housing isuues- unsure of d/c living arrangements 7/7   D/c        SI r/y substance abuse 7/7   D/c               DISCHARGE CRITERIA:  Ability to meet basic life and health needs Adequate post-discharge living arrangements Improved stabilization in mood, thinking, and/or behavior Motivation to continue treatment in a less acute level of care Reduction of life-threatening or endangering symptoms to within safe limits Safe-care adequate arrangements made Verbal commitment to aftercare and medication compliance Withdrawal symptoms are absent or subacute and managed without 24-hour nursing intervention  PRELIMINARY DISCHARGE PLAN: Attend 12-step recovery group Participate in family therapy Return to previous living arrangement  PATIENT/FAMIILY INVOLVEMENT: This treatment plan has been presented to and reviewed with the patient, Louis Zimmerman, and/or family member,  The patient and family have been given the opportunity to ask questions and make suggestions.  Cresenciano Lick 02/01/2013, 4:00 PM

## 2013-02-01 NOTE — ED Notes (Signed)
Patient is resting comfortably. 

## 2013-02-01 NOTE — ED Notes (Signed)
Moved belongings from locker 30 to locker [phone]

## 2013-02-01 NOTE — [facility] Group Notes (Signed)
[facility] LCSW Group Therapy  02/01/2013 3:17 PM  Type of Therapy:  Group Therapy  Participation Level:  Did Not Attend--just admitted into [facility]  Smart, Bannon Giammarco 02/01/2013, 3:17 PM

## 2013-02-01 NOTE — [facility] Counselor (Signed)
Patient accepted to [facility] by Maryjean Morn, PA to Dr. Geoffery Lyons. The room assignment is 304-1. Nursing report # is [id number].

## 2013-02-01 NOTE — Progress Notes (Signed)
[facility] has seen patient and provided him with a oc application. °
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
## 2013-02-01 NOTE — ED Notes (Signed)
Belongings placed in locker 43.    

## 2013-02-01 NOTE — ED Notes (Signed)
Report called to Brynda Rim at [facility]

## 2013-02-01 NOTE — ED Provider Notes (Signed)
Pt accepted to [facility], will transfer stable.   Laray Anger, DO 02/01/13 1004

## 2013-02-02 ENCOUNTER — Encounter (HOSPITAL_COMMUNITY): Payer: Self-pay | Admitting: Psychiatry

## 2013-02-02 DIAGNOSIS — F1193 Opioid use, unspecified with withdrawal: Secondary | ICD-10-CM | POA: Diagnosis present

## 2013-02-02 DIAGNOSIS — F329 Major depressive disorder, single episode, unspecified: Secondary | ICD-10-CM | POA: Diagnosis present

## 2013-02-02 DIAGNOSIS — F1120 Opioid dependence, uncomplicated: Secondary | ICD-10-CM | POA: Diagnosis present

## 2013-02-02 DIAGNOSIS — F32A Depression, unspecified: Secondary | ICD-10-CM | POA: Diagnosis present

## 2013-02-02 MED ORDER — HYDROXYZINE HCL 50 MG PO TABS
50.0000 mg | ORAL_TABLET | Freq: Once | ORAL | Status: AC
  Administered 2013-02-02: 50 mg via ORAL
  Filled 2013-02-02 (×2): qty 1

## 2013-02-02 MED ORDER — ENSURE COMPLETE PO LIQD
237.0000 mL | Freq: Three times a day (TID) | ORAL | Status: DC
  Administered 2013-02-02 – 2013-02-08 (×15): 237 mL via ORAL

## 2013-02-02 MED ORDER — ADULT MULTIVITAMIN W/MINERALS CH
1.0000 | ORAL_TABLET | Freq: Every day | ORAL | Status: DC
  Administered 2013-02-03 – 2013-02-08 (×6): 1 via ORAL
  Filled 2013-02-02 (×9): qty 1

## 2013-02-02 NOTE — [facility] Group Notes (Signed)
[facility] LCSW Group Therapy  02/02/2013 3:51 PM  Type of Therapy:  Group Therapy  Participation Level:  Did Not Attend  Smart, Herbert Seta 02/02/2013, 3:51 PM

## 2013-02-02 NOTE — Progress Notes (Signed)
Adult Psychoeducational Group Note  Date:  02/02/2013 Time:  1:29 PM  Group Topic/Focus:  Recovery Goals:   The focus of this group is to identify appropriate goals for recovery and establish a plan to achieve them.  Participation Level:  Did Not Attend  Participation Quality:  Did Not Attend  Affect:  Did Not Attend  Cognitive:  Did Not Attend  Insight: None  Engagement in Group:  Supportive  Modes of Intervention:  Did Not Attend  Additional Comments:  Patient remained in bed in bed during group.   Lyndee Hensen 02/02/2013, 1:29 PM

## 2013-02-02 NOTE — Progress Notes (Signed)
Patient ID: Louis Zimmerman, male   DOB: 1961/02/18, 52 y.o.   MRN: [id number] D- Patient reports poor sleep last night saying he woke up soaking wet from sweating during the night.  He did go down to breakfast and said he ate well. He also showered this am.   His energy level is very low .  He is rating depression at 8, hopelessness at 6 and anxiety at 8.  He denies any thoughts of self harm. Talked with patient about plan for the day and medications available to help him detox as comfortably as possible.  R- He states his anxiety decreased after talking.  He reports feeling very weak and after sitting on side of the bed and standing he decided he was too weak to try to go to lunch.  Brought patient back some lunch.  Did not give 1200 clonidine due to BP and weakness. Pt resting in bed appears to be sleeping a lot.

## 2013-02-02 NOTE — Progress Notes (Signed)
Recreation Therapy Notes  Date: 07.08.2014 Time: 3:00pm Location: [address] Dayroom      Group Topic/Focus: Communication, Team Work, Problem Solving  Participation Level: Did not attend.  Marykay Lex Ulah Olmo, LRT/CTRS  Elio Haden L 02/02/2013 4:16 PM
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
## 2013-02-02 NOTE — Progress Notes (Signed)
NUTRITION ASSESSMENT  Patient meets criteria for severe malnutrition related to social/environmental causes AEB weight loss of 21-25% weight in the last 6 months and intake <50% for >1 month.  Pt identified as at risk on the Malnutrition Screen Tool  INTERVENTION: 1. Educated patient on the importance of nutrition and encouraged intake of food and beverages. 2. Discussed weight goals. 3. Supplements: Ensure Complete po TID, each supplement provides 350 kcal and 13 grams of protein. 4.  MVI daily  NUTRITION DIAGNOSIS: Unintentional weight loss related to sub-optimal intake as evidenced by pt report.   Goal: Pt to meet >/= 90% of their estimated nutrition needs.  Monitor:  PO intake  Assessment:  Patient admitted with opiate detox/heroine use.  Difficulty with withdrawal.  Laying in bed.  Reports that he ate breakfast.  Receptive to Ensure.  UBW 180-190 lbs 6 months ago with decrease secondary to drug abuse.  Poor po PTA for the past 2 months. Patient with a 21-25% weight loss in the past 6 months <50% intake for >1 month and meets criteria for severe malnutrition.  52 y.o. male  Height: Ht Readings from Last 1 Encounters:  02/01/13 5\' 11"  (1.803 m)    Weight: Wt Readings from Last 1 Encounters:  02/01/13 142 lb (64.411 kg)    Weight Hx: Wt Readings from Last 10 Encounters:  02/01/13 142 lb (64.411 kg)    BMI:  Body mass index is 19.81 kg/(m^2). Pt meets criteria for wnl based on current BMI.  Estimated Nutritional Needs: Kcal: 25-30 kcal/kg Protein: > 1 gram protein/kg Fluid: 1 ml/kcal  Diet Order: General Pt is also offered choice of unit snacks mid-morning and mid-afternoon.  Pt is eating as desired.   Lab results and medications reviewed.   Oran Rein, RD, LDN Clinical Inpatient Dietitian Pager:  [phone] Weekend and after hours pager:  [phone]

## 2013-02-02 NOTE — Progress Notes (Signed)
Pt laying in bed soaked in sweat and shaking all over. Donell Sievert, PA notified new orders received for an additional dose of vistaril. Will continue to monitor patient.

## 2013-02-02 NOTE — Progress Notes (Signed)
Recreation Therapy Notes  Date: 07.08.2014 Time: 2:30pm Location: [address] Dayroom      Group Topic/Focus: Animal Assist Activities/Therapy (AAA/T)  Participation Level: Did not attend   [name] L Rennie Hack, LRT/CTRS  Kennie Snedden L 02/02/2013 3:59 PM 

## 2013-02-02 NOTE — [facility] Suicide Risk Assessment (Signed)
Suicide Risk Assessment  Admission Assessment     Nursing information obtained from:  Patient Demographic factors:  Male;Divorced or widowed;Low socioeconomic status Current Mental Status:  Self-harm thoughts;Self-harm behaviors Loss Factors:  Decrease in vocational status Historical Factors:  Family history of mental illness or substance abuse;Impulsivity Risk Reduction Factors:  Responsible for children under 51 years of age;Living with another person, especially a relative  CLINICAL FACTORS:   Bipolar Disorder:   Depressive phase Alcohol/Substance Abuse/Dependencies  COGNITIVE FEATURES THAT CONTRIBUTE TO RISK:  Closed-mindedness Polarized thinking Thought constriction (tunnel vision)    SUICIDE RISK:   Moderate:  Frequent suicidal ideation with limited intensity, and duration, some specificity in terms of plans, no associated intent, good self-control, limited dysphoria/symptomatology, some risk factors present, and identifiable protective factors, including available and accessible social support.  PLAN OF CARE: Supportive approach/coping skills/relapse prevention                               Clonidine detox                               Reassess and address the co morbidities  I certify that inpatient services furnished can reasonably be expected to improve the patient's condition.  Alexza Norbeck A 02/02/2013, 12:19 PM

## 2013-02-02 NOTE — Progress Notes (Signed)
D - Pt appears anxious this evening. Pt did not attend evening group but was up and active on the unit interacting with peers appropriately after group. Pt has visible tremors in both hands, pt c/o of sweating, pt denies nausea or vomiting. Pt ate a good snack this evening and has been drinking Gatorade without difficulty. Pt denies SI/HI or any auditory or visual hallucinations.  A - Pt offered encouragement and support through therapeutic conversation. Encouraged pt to speak with staff about any concerns or questions. Medications given as ordered.  R - Pt safety maintained with Q 15 minute checks. Pt remains safe on the unit.

## 2013-02-03 DIAGNOSIS — F1120 Opioid dependence, uncomplicated: Secondary | ICD-10-CM

## 2013-02-03 DIAGNOSIS — F322 Major depressive disorder, single episode, severe without psychotic features: Secondary | ICD-10-CM

## 2013-02-03 MED ORDER — ARIPIPRAZOLE 2 MG PO TABS
2.0000 mg | ORAL_TABLET | Freq: Every day | ORAL | Status: DC
  Administered 2013-02-03 – 2013-02-08 (×6): 2 mg via ORAL
  Filled 2013-02-03 (×8): qty 1

## 2013-02-03 MED ORDER — GABAPENTIN 100 MG PO CAPS
200.0000 mg | ORAL_CAPSULE | Freq: Three times a day (TID) | ORAL | Status: DC
  Administered 2013-02-03 – 2013-02-04 (×2): 200 mg via ORAL
  Filled 2013-02-03 (×6): qty 2

## 2013-02-03 MED ORDER — DULOXETINE HCL 30 MG PO CPEP
30.0000 mg | ORAL_CAPSULE | Freq: Every day | ORAL | Status: DC
  Administered 2013-02-03 – 2013-02-05 (×3): 30 mg via ORAL
  Filled 2013-02-03 (×6): qty 1

## 2013-02-03 NOTE — Progress Notes (Signed)
D:  Sharlyne Pacas reports that he slept poorly, and his appetite is improving.  His energy level is low and his ability to pay attention is low.  He is rating depression at 9/10 and hopelessness is 8/10.  He has been in bed most of the morning, but has partially attended some groups.  He reports feeling very tired, weak with symptoms of chills and tremors.  He reports off/on suicidal thoughts, but does contract for safety. A:  Medications administered as ordered.  Emotional support provided.  Safety checks q 15 minutes. R:  Safety maintained on unit.

## 2013-02-03 NOTE — [facility] Group Notes (Signed)
[facility] LCSW Group Therapy  02/03/2013 3:38 PM  Type of Therapy:  Group Therapy  Participation Level:  Active  Participation Quality:  Attentive  Affect:  Depressed  Cognitive:  Appropriate  Insight:  Engaged  Engagement in Therapy:  Engaged  Modes of Intervention:  Confrontation, Discussion, Education, Exploration, Socialization and Support  Summary of Progress/Problems: Emotion Regulation: This group focused on both positive and negative emotion identification and allowed group members to process ways to identify feelings, regulate negative emotions, and find healthy ways to manage internal/external emotions. Group members were asked to reflect on a time when their reaction to an emotion led to a negative outcome and explored how alternative responses using emotion regulation would have benefited them. Group members were also asked to discuss a time when emotion regulation was utilized when a negative emotion was experienced. Louis Zimmerman stated that he "feels like a failure" and identified this emotion as shame or guilt. Louis Zimmerman stated that he feels "like his heart hurts" and this is how he knows that he is experiencing shame. Louis Zimmerman was able to process how asking for help and "surrendering to the process" will help him overcome his feelings of being overwhelmed by life's obstacles/struggles. Louis Zimmerman and other group members processed how to deal with the negative emotions brought on by alcohol/substance abuse and explored how to deal with intrusive thoughts and feeling overwhelmed.    Louis Zimmerman, Louis Zimmerman 02/03/2013, 3:38 PM

## 2013-02-03 NOTE — Progress Notes (Signed)
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 Endorses he has been extremely depressed unable to function, suicidal, hopeless. Wants help with his depression and his anxiety Diagnosis:  Opioid Dependence, Major Depression severe  ADL's:  Intact  Sleep: Fair  Appetite:  Fair  Suicidal Ideation:  Plan:  denies Intent:  denies Means:  denies Homicidal Ideation:  Plan:  denies Intent:  denies Means:  denies AEB (as evidenced by):  Psychiatric Specialty Exam: Review of Systems  Constitutional: Negative.   HENT: Negative.   Eyes: Negative.   Respiratory: Negative.   Cardiovascular: Negative.   Gastrointestinal: Negative.   Genitourinary: Negative.   Musculoskeletal: Positive for back pain.  Skin: Negative.   Neurological: Negative.   Endo/Heme/Allergies: Negative.   Psychiatric/Behavioral: Positive for depression, suicidal ideas and substance abuse. The patient is nervous/anxious and has insomnia.     Blood pressure 117/66, pulse 82, temperature 97.7 F (36.5 C), temperature source Oral, resp. rate 16, height 5\' 11"  (1.803 m), weight 64.411 kg (142 lb), SpO2 100.00%.Body mass index is 19.81 kg/(m^2).  General Appearance: Fairly Groomed  Patent attorney::  Fair  Speech:  Clear and Coherent  Volume:  Decreased  Mood:  Anxious, Depressed, Dysphoric and Hopeless  Affect:  Restricted  Thought Process:  Coherent and Goal Directed  Orientation:  Full (Time, Place, and Person)  Thought Content:  worries, concerns, symptoms, fears  Suicidal Thoughts:  Intermittent  Homicidal Thoughts:  No  Memory:  Immediate;   Fair Recent;   Fair Remote;   Fair  Judgement:  Fair  Insight:  Present  Psychomotor Activity:  Restlessness  Concentration:  Fair  Recall:  Fair  Akathisia:  No  Handed:  Right  AIMS (if  indicated):     Assets:  Desire for Improvement  Sleep:  Number of Hours: 5.25   Current Medications: Current Facility-Administered Medications  Medication Dose Route Frequency Provider Last Rate Last Dose  . acetaminophen (TYLENOL) tablet 650 mg  650 mg Oral Q6H PRN Court Joy, PA-C      . alum & mag hydroxide-simeth (MAALOX/MYLANTA) 200-200-20 MG/5ML suspension 30 mL  30 mL Oral Q4H PRN Court Joy, PA-C      . ARIPiprazole (ABILIFY) tablet 2 mg  2 mg Oral Daily Rachael Fee, MD   2 mg at 02/03/13 1253  . cloNIDine (CATAPRES) tablet 0.1 mg  0.1 mg Oral QID Court Joy, PA-C   0.1 mg at 02/02/13 2237   Followed by  . [START ON 02/04/2013] cloNIDine (CATAPRES) tablet 0.1 mg  0.1 mg Oral BH-qamhs Court Joy, PA-C       Followed by  . [START ON 02/06/2013] cloNIDine (CATAPRES) tablet 0.1 mg  0.1 mg Oral QAC breakfast Court Joy, PA-C      . dicyclomine (BENTYL) tablet 20 mg  20 mg Oral Q6H PRN Court Joy, PA-C   20 mg at 02/02/13 1037  . DULoxetine (CYMBALTA) DR capsule 30 mg  30 mg Oral Daily Rachael Fee, MD   30 mg at 02/03/13 1253  . feeding supplement (ENSURE COMPLETE) liquid 237 mL  237 mL Oral TID BM Jeoffrey Massed, RD   237 mL at 02/03/13 1512  . hydrOXYzine (ATARAX/VISTARIL) tablet 25 mg  25 mg Oral Q6H PRN Court Joy, PA-C  25 mg at 02/03/13 1119  . loperamide (IMODIUM) capsule 2-4 mg  2-4 mg Oral PRN Court Joy, PA-C      . magnesium hydroxide (MILK OF MAGNESIA) suspension 30 mL  30 mL Oral Daily PRN Court Joy, PA-C      . methocarbamol (ROBAXIN) tablet 500 mg  500 mg Oral Q8H PRN Court Joy, PA-C   500 mg at 02/02/13 1213  . multivitamin with minerals tablet 1 tablet  1 tablet Oral Daily Jeoffrey Massed, RD   1 tablet at 02/03/13 0825  . naproxen (NAPROSYN) tablet 500 mg  500 mg Oral BID PRN Court Joy, PA-C   500 mg at 02/03/13 0831  . nicotine (NICODERM CQ - dosed in mg/24 hours) patch 21 mg  21 mg Transdermal Q0600 Court Joy, PA-C   21 mg at 02/03/13 1610  . ondansetron (ZOFRAN-ODT) disintegrating tablet 4 mg  4 mg Oral Q6H PRN Court Joy, PA-C      . traZODone (DESYREL) tablet 50 mg  50 mg Oral QHS PRN,MR X 1 Court Joy, PA-C   50 mg at 02/02/13 2338    Lab Results:  Results for orders placed during the hospital encounter of 02/01/13 (from the past 48 hour(s))  CBC     Status: Abnormal   Collection Time    02/01/13  7:57 PM      Result Value Range   WBC 12.3 (*) 4.0 - 10.5 K/uL   RBC 4.26  4.22 - 5.81 MIL/uL   Hemoglobin 12.6 (*) 13.0 - 17.0 g/dL   HCT 96.0 (*) 45.4 - 09.8 %   MCV 85.9  78.0 - 100.0 fL   MCH 29.6  26.0 - 34.0 pg   MCHC 34.4  30.0 - 36.0 g/dL   RDW 11.9  14.7 - 82.9 %   Platelets 404 (*) 150 - 400 K/uL    Physical Findings: AIMS: Facial and Oral Movements Muscles of Facial Expression: None, normal Lips and Perioral Area: None, normal Jaw: None, normal Tongue: None, normal,Extremity Movements Upper (arms, wrists, hands, fingers): None, normal Lower (legs, knees, ankles, toes): None, normal, Trunk Movements Neck, shoulders, hips: None, normal, Overall Severity Severity of abnormal movements (highest score from questions above): None, normal Incapacitation due to abnormal movements: None, normal Patient's awareness of abnormal movements (rate only patient's report): No Awareness, Dental Status Current problems with teeth and/or dentures?: No Does patient usually wear dentures?: No  CIWA:  CIWA-Ar Total: 4 COWS:  COWS Total Score: 4  Treatment Plan Summary: Daily contact with patient to assess and evaluate symptoms and progress in treatment Medication management  Plan: Supportive approach/coping skills/relapse prevention           Will start Cymbalta/Abilify            Neurontin 200 mg TID  Medical Decision Making Problem Points:  Review of psycho-social stressors (1) Data Points:  Review of medication regiment & side effects (2) Review of new medications or  change in dosage (2)  I certify that inpatient services furnished can reasonably be expected to improve the patient's condition.   [name] A 02/03/2013, 5:19 PM

## 2013-02-03 NOTE — Discharge Planning (Signed)
CSW met with pt's sister Tammy (consent from pt signed) to discuss d/c plan. Pt's sister would like pt to go to [facility] for 30-90 days or [facility]. Tammy unsure if her brother would be open to an i/p facility. Tammy stated that she will talk to him tonight during visitation and will touch base with CSW in the morning. CSW will also explore i/p options with pt tomorrow to find out if he is open to this. Currently, pt hoping to be set up with [facility] for med management and [facility] for therapy and was planning to return to his mother's home.

## 2013-02-03 NOTE — Progress Notes (Signed)
Patient ID: Louis Zimmerman, male   DOB: Sep 01, 1960, 52 y.o.   MRN: [id number]  D: As soon as the writer opened the med door pt blurted, "it was a good day". Pt stated that he went outside today, stayed out of bed most of the day and had a good visit with his family. Pt was satisfied that he's been started on psych meds.  Informed the writer that he stayed out of bed in hopes of sleeping better tonight.  A:  Support and encouragement was offered. 15 min checks continued for safety.  R: Pt remains safe.

## 2013-02-03 NOTE — BHH Group Notes (Signed)
[facility] LCSW Aftercare Discharge Planning Group Note   02/03/2013 9:36 AM  Participation Quality:  Did not attend   Smart, Herbert Seta

## 2013-02-03 NOTE — Tx Team (Signed)
Interdisciplinary Treatment Plan Update (Adult)  Date: 02/03/2013   Time Reviewed: 10:02 AM  Progress in Treatment:  Attending groups: No.  Participating in groups:  No. Taking medication as prescribed: Yes  Tolerating medication: Yes  Family/Significant othe contact made: Not yet. SPE required for this pt.   Patient understands diagnosis: Yes, AEB seeking treatment for heroin detox and depression. Pt had recent suicide attempt but reports no SI currently.  Discussing patient identified problems/goals with staff: Yes  Medical problems stabilized or resolved: Yes  Denies suicidal/homicidal ideation: Yes during admission and pt self report.  Patient has not harmed self or Others: Yes  New problem(s) identified: n/a  Discharge Plan or Barriers: Pt not attending d/c planning groups. CSW to continue exploring aftercare options with pt. No insurance/[place] Resident.  Additional comments: Louis Zimmerman is an 52 y.o. male.  Pt has been using heroin for 1 yr on a daily basis with the amount varying depending on what the pt could get. Pt denies current SI/Hi and AVH. Pt reports he tried to end life 3 days ago by OD but no one knew and there was no help sought. Pt reports knowing that detox off Heroin was going to be hard and did not want to live through it. Pt says "I failed and I guess I got to go through it now." Pt is curled up on his side. Pt COWS appears to be 15. Pt has vomited, has chills, is restless, sensitive to light, tremulous, Pulse is 95, pt rocking back and forth. Pt appears to be in a lot of discomfort. Pt has detoxed self for 3 days prior to coming to ED. MD do not believe pt needs medical admit. However, [facility] detox won't accept pt in current state and by the time pt stabilizes he will benefit more from rehab than detox. Pt reports depression and hx of depression. Pt appears to have some hx of psych related issues. Nurses indicate pt family may not know about heroin use. They have  asked about prescription pill abuse. ED has not discussed with family due to confidentiality. ACT asked pt if family knew he was using heroin and pt responded "Yes."   Reason for Continuation of Hospitalization: Withdrawals-Clonidine taper Mood stabilization Estimated length of stay: 3-5 days For review of initial/current patient goals, please see plan of care.  Attendees:  Patient:    Family:    Physician: Geoffery Lyons MD 02/03/2013 10:02 AM   Nursing: Maureen Ralphs RN  02/03/2013 10:02 AM   Clinical Social Worker Charity Tessier Smart, LCSWA  02/03/2013 10:02 AM   Other: Aram Beecham RN 02/03/2013 10:03 AM   Other: Darden Dates, Nurse CM 02/03/2013 10:03 AM   Other: Aggie PA  02/03/2013 10:03 AM   Other:    Scribe for Treatment Team:  [employer] LCSWA 02/03/2013 10:03 AM

## 2013-02-03 NOTE — Progress Notes (Signed)
Patient ID: CURLEY HOGEN, male   DOB: 10-Aug-1960, 52 y.o.   MRN: [id number]  D: Writer asked pt about his day. Pt stated, "Bad. I need an antidepressant. I've been taking them my whole life and they don't have me on anything." Pt sat up in bed and discussed his day with the Clinical research associate. Stated he's been in bed for part of the day, but was able to attend some groups.  A:  Support and encouragement was offered. 15 min checks continued for safety.  R: Pt remains safe.

## 2013-02-03 NOTE — BHH Counselor (Signed)
Adult Comprehensive Assessment  Patient ID: Louis Zimmerman, male   DOB: [birth year], 52 y.o.   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 Family Relationships: Strong relationship with siblings/strained with mother Financial / Lack of resources (include bankruptcy): limited funds due to business going under in 28-Dec-2006. Housing / Lack of housing: lives with his mother Physical health (include injuries & life threatening diseases): shoulder problems.  Social relationships: none outside of family Substance abuse: cocaine and heroin daily for six months. $75 on cocaine and $150 on heroin daily. bought pain meds off street since insurance lapsed.  Bereavement / Loss: Brother in Social worker passed away in August 29, 2022. Very difficult for pt to deal with. "There is some correllation between his death and my substance abuse."  Living/Environment/Situation:  Living Arrangements: Parent Living conditions (as described by patient or guardian): Very large "[place]" at his mother's home.  How long has patient lived in current situation?: 2 years What is atmosphere in current home: Loving;Supportive  Family History:  Marital status: Divorced Divorced, when?: 12/28/1998 What types of issues is patient dealing with in the relationship?: Pt left his wife. "She was just mean. I avoid her like the plague." Additional relationship information: n/a Does patient have children?: Yes How many children?: 2 How is patient's relationship with their children?: 2 girls. 4 and 8 years old. Very close to his daughters. They are supportive of pt being in facility.   Childhood History:  By whom was/is the patient raised?: Both parents Additional childhood history information: Parents were married. "I had a normal family." Description of  patient's relationship with caregiver when they were a child: Close to parents. Taught to be the "strong one." Pt was only male child. Patient's description of current relationship with people who raised him/her: Father died when pt was in 28-Dec-2022. "my mother is a negative person." Strained relationship.  Does patient have siblings?: Yes Number of Siblings: 4 Description of patient's current relationship with siblings: Four sisters. 1/2 sister who is 8 years older. Close to all sisters. Very close to all siblings, especially two sisters karen and tammy.  Did patient suffer any verbal/emotional/physical/sexual abuse as a child?: No Did patient suffer from severe childhood neglect?: No Has patient ever been sexually abused/assaulted/raped as an adolescent or adult?: No Was the patient ever a victim of a crime or a disaster?: No Witnessed domestic violence?: No Has patient been effected by domestic violence as an adult?: No  Education:  Highest grade of school patient has completed: high Garment/textile technologist and some college in [employer].  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 ) Has patient ever served in combat?: No  Financial Resources:   Financial resources: Support from parents / caregiver Does patient have a Lawyer or guardian?: No  Alcohol/Substance Abuse:   What has been your use of drugs/alcohol within the last 12 months?: 1-2 grams cocaine, $150 per day heroin daily. no alcohol or THC.  If attempted suicide, did  drugs/alcohol play a role in this?: Yes (overdose on  heroin) Alcohol/Substance Abuse Treatment Hx: Denies past history Has alcohol/substance abuse ever caused legal problems?: No  Social Support System:   Patient's Community Support System: Poor Describe Community Support System: noone outside of family. Type of faith/religion: questioning if God exists How does patient's faith help to cope with current illness?: "it doesn't help me."  Leisure/Recreation:   Leisure and Hobbies: skydiving, downhill racing, remote control aircraft, hunting, fishing  Strengths/Needs:   What things does the patient do well?: "i cant think of anything." I'm good at getting my adrenaline going." In what areas does patient struggle / problems for patient: overwhelmed easily, difficulty asking for help, difficulty opening up.   Discharge Plan:   Does patient have access to transportation?: Yes Will patient be returning to same living situation after discharge?: Yes Currently receiving community mental health services: No If no, would patient like referral for services when discharged?: Yes (What county?) [facility]) Does patient have financial barriers related to discharge medications?: Yes Patient description of barriers related to discharge medications: no insurance and limited funds.   Summary/Recommendations:   Summary and Recommendations (to be completed by the evaluator): Pt is 52 year old male living in [place], [place] with his mother. Pt presents to the hospital with SI, depression, bipolar disorder, and heroin detox-clonidine taper. 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 Recomendations for pt include: therapeutic milieu, encourage group attendance and participation, crisis stabilization, clonidine taper for withdrawals, medication management for mood stabilization, and development of comprehensive mental wellness/sobriety plan.   Smart, Research scientist (physical sciences). 02/03/2013

## 2013-02-04 DIAGNOSIS — F19239 Other psychoactive substance dependence with withdrawal, unspecified: Principal | ICD-10-CM

## 2013-02-04 DIAGNOSIS — F329 Major depressive disorder, single episode, unspecified: Secondary | ICD-10-CM

## 2013-02-04 MED ORDER — GABAPENTIN 100 MG PO CAPS
200.0000 mg | ORAL_CAPSULE | Freq: Once | ORAL | Status: AC
  Administered 2013-02-04: 200 mg via ORAL
  Filled 2013-02-04: qty 2

## 2013-02-04 MED ORDER — GABAPENTIN 400 MG PO CAPS
400.0000 mg | ORAL_CAPSULE | Freq: Three times a day (TID) | ORAL | Status: DC
Start: 2013-02-04 — End: 2013-02-08
  Administered 2013-02-04 – 2013-02-08 (×13): 400 mg via ORAL
  Filled 2013-02-04: qty 42
  Filled 2013-02-04 (×6): qty 1
  Filled 2013-02-04: qty 42
  Filled 2013-02-04 (×8): qty 1
  Filled 2013-02-04: qty 42
  Filled 2013-02-04 (×4): qty 1

## 2013-02-04 MED ORDER — MIRTAZAPINE 30 MG PO TABS
30.0000 mg | ORAL_TABLET | Freq: Every day | ORAL | Status: DC
  Administered 2013-02-04 – 2013-02-07 (×4): 30 mg via ORAL
  Filled 2013-02-04: qty 14
  Filled 2013-02-04 (×7): qty 1

## 2013-02-04 NOTE — Progress Notes (Signed)
Patient ID: Louis Zimmerman, male   DOB: 10-02-1960, 52 y.o.   MRN: [id number] [facility] MD Progress Note  02/04/2013 12:40 PM Louis Zimmerman  MRN:  [id number]  Subjective:  Louis Zimmerman endorses severe pain complaints to his leg areas. Says, "That is why I use opiates. They work in controlling my pain". He says he is hurting real bad, and it is progressing. Continue to endorse night sweats, insomnia and increased anxiety. Rated his anxiety at #10.  ADL's:  Intact  Sleep: Fair  Appetite:  Fair  Suicidal Ideation:  Plan:  denies Intent:  denies Means:  denies Homicidal Ideation:  Plan:  denies Intent:  denies Means:  denies  AEB (as evidenced by): Per patient's reports.  Psychiatric Specialty Exam: Review of Systems  Constitutional: Negative.   HENT: Negative.   Eyes: Negative.   Respiratory: Negative.   Cardiovascular: Negative.   Gastrointestinal: Negative.   Genitourinary: Negative.   Musculoskeletal: Positive for back pain.  Skin: Negative.   Neurological: Negative.   Endo/Heme/Allergies: Negative.   Psychiatric/Behavioral: Positive for depression, suicidal ideas and substance abuse. The patient is nervous/anxious and has insomnia.     Blood pressure 116/70, pulse 94, temperature 98.7 F (37.1 C), temperature source Oral, resp. rate 16, height 5\' 11"  (1.803 m), weight 64.411 kg (142 lb), SpO2 100.00%.Body mass index is 19.81 kg/(m^2).  General Appearance: Fairly Groomed  Patent attorney::  Fair  Speech:  Clear and Coherent  Volume:  Decreased  Mood:  Anxious, Depressed, Dysphoric and Hopeless  Affect:  Restricted  Thought Process:  Coherent and Goal Directed  Orientation:  Full (Time, Place, and Person)  Thought Content:  worries, concerns, symptoms, fears  Suicidal Thoughts:  Intermittent  Homicidal Thoughts:  No  Memory:  Immediate;   Fair Recent;   Fair Remote;   Fair  Judgement:  Fair  Insight:  Present  Psychomotor Activity:  Restlessness  Concentration:  Fair   Recall:  Fair  Akathisia:  No  Handed:  Right  AIMS (if indicated):     Assets:  Desire for Improvement  Sleep:  Number of Hours: 2.75   Current Medications: Current Facility-Administered Medications  Medication Dose Route Frequency Provider Last Rate Last Dose  . acetaminophen (TYLENOL) tablet 650 mg  650 mg Oral Q6H PRN Court Joy, PA-C      . alum & mag hydroxide-simeth (MAALOX/MYLANTA) 200-200-20 MG/5ML suspension 30 mL  30 mL Oral Q4H PRN Court Joy, PA-C      . ARIPiprazole (ABILIFY) tablet 2 mg  2 mg Oral Daily Rachael Fee, MD   2 mg at 02/04/13 0839  . cloNIDine (CATAPRES) tablet 0.1 mg  0.1 mg Oral BH-qamhs Court Joy, PA-C   0.1 mg at 02/04/13 0840   Followed by  . [START ON 02/06/2013] cloNIDine (CATAPRES) tablet 0.1 mg  0.1 mg Oral QAC breakfast Court Joy, PA-C      . dicyclomine (BENTYL) tablet 20 mg  20 mg Oral Q6H PRN Court Joy, PA-C   20 mg at 02/02/13 1037  . DULoxetine (CYMBALTA) DR capsule 30 mg  30 mg Oral Daily Rachael Fee, MD   30 mg at 02/04/13 0840  . feeding supplement (ENSURE COMPLETE) liquid 237 mL  237 mL Oral TID BM Jeoffrey Massed, RD   237 mL at 02/04/13 1000  . gabapentin (NEURONTIN) capsule 400 mg  400 mg Oral TID Sanjuana Kava, NP   400 mg at 02/04/13 1200  .  hydrOXYzine (ATARAX/VISTARIL) tablet 25 mg  25 mg Oral Q6H PRN Court Joy, PA-C   25 mg at 02/04/13 0506  . loperamide (IMODIUM) capsule 2-4 mg  2-4 mg Oral PRN Court Joy, PA-C      . magnesium hydroxide (MILK OF MAGNESIA) suspension 30 mL  30 mL Oral Daily PRN Court Joy, PA-C      . methocarbamol (ROBAXIN) tablet 500 mg  500 mg Oral Q8H PRN Court Joy, PA-C   500 mg at 02/02/13 1213  . multivitamin with minerals tablet 1 tablet  1 tablet Oral Daily Jeoffrey Massed, RD   1 tablet at 02/04/13 0841  . naproxen (NAPROSYN) tablet 500 mg  500 mg Oral BID PRN Court Joy, PA-C   500 mg at 02/03/13 0831  . nicotine (NICODERM CQ - dosed in mg/24 hours)  patch 21 mg  21 mg Transdermal Q0600 Court Joy, PA-C   21 mg at 02/04/13 1610  . ondansetron (ZOFRAN-ODT) disintegrating tablet 4 mg  4 mg Oral Q6H PRN Court Joy, PA-C      . traZODone (DESYREL) tablet 50 mg  50 mg Oral QHS PRN,MR X 1 Court Joy, PA-C   50 mg at 02/03/13 2209    Lab Results:  No results found for this or any previous visit (from the past 48 hour(s)).  Physical Findings: AIMS: Facial and Oral Movements Muscles of Facial Expression: None, normal Lips and Perioral Area: None, normal Jaw: None, normal Tongue: None, normal,Extremity Movements Upper (arms, wrists, hands, fingers): None, normal Lower (legs, knees, ankles, toes): None, normal, Trunk Movements Neck, shoulders, hips: None, normal, Overall Severity Severity of abnormal movements (highest score from questions above): None, normal Incapacitation due to abnormal movements: None, normal Patient's awareness of abnormal movements (rate only patient's report): No Awareness, Dental Status Current problems with teeth and/or dentures?: No Does patient usually wear dentures?: No  CIWA:  CIWA-Ar Total: 3 COWS:  COWS Total Score: 4  Treatment Plan Summary: Daily contact with patient to assess and evaluate symptoms and progress in treatment Medication management  Plan: Supportive approach/coping skills/relapse prevention Continue Cymbalta/Abilify Increased Neurontin from 200 mg TID to 400 mg tid for anxiety/pain control. Continue detox treatment.  Medical Decision Making Problem Points:  Review of psycho-social stressors (1) Data Points:  Review of medication regiment & side effects (2) Review of new medications or change in dosage (2)  I certify that inpatient services furnished can reasonably be expected to improve the patient's condition.   Armandina Stammer I, PMHNP-BC 02/04/2013, 12:40 PM

## 2013-02-04 NOTE — Progress Notes (Signed)
D:  Patient up and present in the milieu most of the day.  He has attended some of the groups and is interacting with peers.  He has requested medications for anxiety, but now has it available on a scheduled basis.  He states he has pain all the time.  Denies suicidal ideation. A:  Medications given as prescribed.  Ensure given as scheduled.  Encouraged participation in groups.   R:  Pleasant and cooperative with staff.  Interacting well with peers.  Progressing through detox process.

## 2013-02-04 NOTE — [facility] Suicide Risk Assessment (Signed)
[facility] INPATIENT:  Family/Significant Other Suicide Prevention Education  Suicide Prevention Education:  Education Completed: Tammy Fields (patient's sister) has been identified by the patient as the family member/significant other with whom the patient will be residing, and identified as the person(s) who will aid the patient in the event of a mental health crisis (suicidal ideations/suicide attempt).  With written consent from the patient, the family member/significant other has been provided the following suicide prevention education, prior to the and/or following the discharge of the patient.  The suicide prevention education provided includes the following:  Suicide risk factors  Suicide prevention and interventions  National Suicide Hotline telephone number  [facility] assessment telephone number  [facility] Emergency Assistance 911  [facility] and/or Residential Mobile Crisis Unit telephone number  Request made of family/significant other to:  Remove weapons (e.g., guns, rifles, knives), all items previously/currently identified as safety concern.    Remove drugs/medications (over-the-counter, prescriptions, illicit drugs), all items previously/currently identified as a safety concern.  The family member/significant other verbalizes understanding of the suicide prevention education information provided.  The family member/significant other agrees to remove the items of safety concern listed above.  Louis Zimmerman, Louis Zimmerman 02/04/2013, 8:08 AM

## 2013-02-04 NOTE — [facility] Group Notes (Signed)
[facility] LCSW Aftercare Discharge Planning Group Note   02/04/2013 9:56 AM  Participation Quality:  Engaged  Mood/Affect:  Anxious  Depression Rating:  6  Anxiety Rating:  6  Thoughts of Suicide:  No Will you contract for safety?   NA  Current AVH:  No  Plan for Discharge/Comments:  Pt reported that he met with family members last night. He is now open to i/p treatment. Pt did not wish to go to [facility] but was open to [facility]. Pt's sister called CSW this morning. CSW notified pt's sister that he decided to go to [facility] and will follow up at [facility] for med management. Pt feels that meds "haven't kicked in yet" and reports feeling "cloudy" and "jumpy" this morning.   Transportation Means: sister/family member  Supports: family and community/church supports are strong for this pt.   Smart, [employer]

## 2013-02-04 NOTE — [facility] Group Notes (Signed)
[facility] LCSW Group Therapy  02/04/2013 3:36 PM  Type of Therapy:  Group Therapy  Participation Level:  Active  Participation Quality:  Attentive  Affect:  Appropriate  Cognitive:  Appropriate  Insight:  Engaged  Engagement in Therapy:  Engaged  Modes of Intervention:  Confrontation, Discussion, Education, Exploration, Socialization and Support  Summary of Progress/Problems: Yoshiharu came to group about 45 minutes late. He immediately jumped into group discussion. Makani presents with pleasant affect and seems to be thinking more clearly AEB acknowledging the imporatance of "asking for help and being accountable for my actions" in terms of regaining a sense of balance in life. Casimiro Needle further demonstrated his level of insight by processing how inpatient treatment at [facility] is a necessary step toward healing himself. "I have to focus on myself before I can help my family, take care of my bills, and focus on other people." Mars explored how allowing the responsibility of bills/taking care of his elderly mother to be shared with siblings will help him in his quest to focus on himself and reestablish balance in life. Bucky shows signs of progression today AEB his ability to connect how seeking long term treatment and asking for help will assist him with regaining control over his life. "Giving up control now will help me regain control down the road."   Smart, Jerri Hargadon 02/04/2013, 3:36 PM

## 2013-02-04 NOTE — Progress Notes (Signed)
Recreation Therapy Notes  Date: 07.10.2014 Time: 3:00pm Location: [address] Dayroom      Group Topic/Focus: Leisure Education  Participation Level: Active  Participation Quality: Appropriate, Attentive, Supportive  Affect: Euthymic  Cognitive: Appropriate   Additional Comments: Activity: Leisure Alphabet; Explanation: Patients were given a worksheet with the 26 letters of the alphabet on it. Patients were asked to identify a leisure/recreation activity for each letter of the alphabet.   Patient participated in opening discussion about what leisure means. Patient actively participated in group activity. Patient was able to identify activities to put on individual list. Patient contributed to the group list of leisure/recreation activities. Patient listened to wrap up discussion about using leisure/recreation in a healthy way, as well as using leisure/recreation to build a healthy support system. Peer inquired about building a support system out of individuals in current treatment environment. Patient offered peer support and advice.   Marykay Lex Osinachi Navarrette, LRT/CTRS  Jearl Klinefelter 02/04/2013 4:28 PM

## 2013-02-04 NOTE — Discharge Planning (Signed)
Spoke with Lenda Kelp at [facility]. He gave Isiaih admit date of Thursday, 7/17 at 8AM.

## 2013-02-04 NOTE — Progress Notes (Signed)
Patient did attend the evening karaoke group. Pt was engaged, supportive, and participated by singing a song.  

## 2013-02-05 MED ORDER — DULOXETINE HCL 60 MG PO CPEP
60.0000 mg | ORAL_CAPSULE | Freq: Every day | ORAL | Status: DC
  Administered 2013-02-06 – 2013-02-08 (×3): 60 mg via ORAL
  Filled 2013-02-05 (×4): qty 1
  Filled 2013-02-05: qty 14
  Filled 2013-02-05: qty 1

## 2013-02-05 NOTE — Discharge Planning (Signed)
Trey Paula A from [facility] called to confirm pt's admission date of 7/17 (thurs.). He could not move pt to Wed. Pt notified and stated that he would spend the day at his mother's doing yard work, attend AA groups, and go to [place] to get his 30 day med supply. CSW attempted to contact pt's sister, Tammy in order to inform her about date and pt's plans. No voicemail set up. CSW encouraged pt to talk to his sister and family in order to make sure there are supportive people around him during his few days at home prior to [facility] admission.

## 2013-02-05 NOTE — Progress Notes (Signed)
Patient ID: Louis Zimmerman, male   DOB: 1961/01/10, 52 y.o.   MRN: [id number] Patient states that he is 75% clearer; he reports that he is not having as many withdrawal symptoms today.  He rates his depression as a 5/10 today; hopelessness a 5.  He denies any SI/HI/AVH.  He will follow up with [facility] for long term treatment.  He is motivated for treatment.  He is calm and cooperative.  He is attending groups and participating in his treatment.  Continue to monitor medication management and MD orders.  Safety checks completed every 15 minutes per protocol.  Patient's behavior has been appropriate.

## 2013-02-05 NOTE — Progress Notes (Signed)
[facility] MD Progress Note  02/05/2013 5:34 PM Louis Zimmerman  MRN:  [id number] Subjective:  Admits to less anxiety and agitation. He slept few more hours with the Remeron. Still admits to feeling the underlying depression, disappointment on himself. States that his is not the drug culture that he hears about. His use he states came late in his life and that he has a previous history of being successful in life for 30 years, drug free He is trying to understand the factors that played a role in him getting dependent. He is surprised that he has been on pain pill for so long for his shoulders and that he is off right now and his shoulders do not hurt as bad a se would have expected  Diagnosis:  Opioid Dependence/withdrawal, Major Depression  ADL's:  Intact  Sleep: better than the night before  Appetite:  Fair  Suicidal Ideation:  Plan:  denies Intent:  denies Means:  denies Homicidal Ideation:  Plan:  denies Intent:  denies Means:  denies AEB (as evidenced by):  Psychiatric Specialty Exam: Review of Systems  Constitutional: Negative.   HENT: Negative.   Eyes: Negative.   Respiratory: Negative.   Cardiovascular: Negative.   Gastrointestinal: Negative.   Genitourinary: Negative.   Musculoskeletal: Positive for joint pain.  Skin: Negative.   Neurological: Negative.   Endo/Heme/Allergies: Negative.   Psychiatric/Behavioral: Positive for depression and substance abuse. The patient is nervous/anxious and has insomnia.     Blood pressure 116/74, pulse 99, temperature 98.5 F (36.9 C), temperature source Oral, resp. rate 20, height 5\' 11"  (1.803 m), weight 64.411 kg (142 lb), SpO2 100.00%.Body mass index is 19.81 kg/(m^2).  General Appearance: Fairly Groomed  Patent attorney::  Fair  Speech:  Clear and Coherent and Slow  Volume:  Decreased  Mood:  Anxious and Depressed  Affect:  Restricted  Thought Process:  Coherent and Goal Directed  Orientation:  Full (Time, Place, and Person)   Thought Content:  worries, concerns  Suicidal Thoughts:  No  Homicidal Thoughts:  No  Memory:  Immediate;   Fair Recent;   Fair Remote;   Fair  Judgement:  Fair  Insight:  Present  Psychomotor Activity:  Restlessness  Concentration:  Fair  Recall:  Fair  Akathisia:  No  Handed:  Right  AIMS (if indicated):     Assets:  Desire for Improvement  Sleep:  Number of Hours: 5.75   Current Medications: Current Facility-Administered Medications  Medication Dose Route Frequency Provider Last Rate Last Dose  . acetaminophen (TYLENOL) tablet 650 mg  650 mg Oral Q6H PRN Court Joy, PA-C      . alum & mag hydroxide-simeth (MAALOX/MYLANTA) 200-200-20 MG/5ML suspension 30 mL  30 mL Oral Q4H PRN Court Joy, PA-C      . ARIPiprazole (ABILIFY) tablet 2 mg  2 mg Oral Daily Rachael Fee, MD   2 mg at 02/05/13 0837  . cloNIDine (CATAPRES) tablet 0.1 mg  0.1 mg Oral BH-qamhs Court Joy, PA-C   0.1 mg at 02/05/13 4098   Followed by  . [START ON 02/06/2013] cloNIDine (CATAPRES) tablet 0.1 mg  0.1 mg Oral QAC breakfast Court Joy, PA-C      . dicyclomine (BENTYL) tablet 20 mg  20 mg Oral Q6H PRN Court Joy, PA-C   20 mg at 02/02/13 1037  . [START ON 02/06/2013] DULoxetine (CYMBALTA) DR capsule 60 mg  60 mg Oral Daily Rachael Fee, MD      .  feeding supplement (ENSURE COMPLETE) liquid 237 mL  237 mL Oral TID BM Jeoffrey Massed, RD   237 mL at 02/05/13 1017  . gabapentin (NEURONTIN) capsule 400 mg  400 mg Oral TID Sanjuana Kava, NP   400 mg at 02/05/13 1207  . hydrOXYzine (ATARAX/VISTARIL) tablet 25 mg  25 mg Oral Q6H PRN Court Joy, PA-C   25 mg at 02/04/13 0506  . loperamide (IMODIUM) capsule 2-4 mg  2-4 mg Oral PRN Court Joy, PA-C      . magnesium hydroxide (MILK OF MAGNESIA) suspension 30 mL  30 mL Oral Daily PRN Court Joy, PA-C      . methocarbamol (ROBAXIN) tablet 500 mg  500 mg Oral Q8H PRN Court Joy, PA-C   500 mg at 02/02/13 1213  . mirtazapine  (REMERON) tablet 30 mg  30 mg Oral QHS Rachael Fee, MD   30 mg at 02/04/13 2141  . multivitamin with minerals tablet 1 tablet  1 tablet Oral Daily Jeoffrey Massed, RD   1 tablet at 02/05/13 6045  . naproxen (NAPROSYN) tablet 500 mg  500 mg Oral BID PRN Court Joy, PA-C   500 mg at 02/03/13 0831  . nicotine (NICODERM CQ - dosed in mg/24 hours) patch 21 mg  21 mg Transdermal Q0600 Court Joy, PA-C   21 mg at 02/04/13 4098  . ondansetron (ZOFRAN-ODT) disintegrating tablet 4 mg  4 mg Oral Q6H PRN Court Joy, PA-C      . traZODone (DESYREL) tablet 50 mg  50 mg Oral QHS PRN,MR X 1 Court Joy, PA-C   50 mg at 02/03/13 2209    Lab Results: No results found for this or any previous visit (from the past 48 hour(s)).  Physical Findings: AIMS: Facial and Oral Movements Muscles of Facial Expression: None, normal Lips and Perioral Area: None, normal Jaw: None, normal Tongue: None, normal,Extremity Movements Upper (arms, wrists, hands, fingers): None, normal Lower (legs, knees, ankles, toes): None, normal, Trunk Movements Neck, shoulders, hips: None, normal, Overall Severity Severity of abnormal movements (highest score from questions above): None, normal Incapacitation due to abnormal movements: None, normal Patient's awareness of abnormal movements (rate only patient's report): No Awareness, Dental Status Current problems with teeth and/or dentures?: No Does patient usually wear dentures?: No  CIWA:  CIWA-Ar Total: 2 COWS:  COWS Total Score: 4  Treatment Plan Summary: Daily contact with patient to assess and evaluate symptoms and progress in treatment Medication management  Plan: Supportive approach/coping skills/relapse prevention           Continue the Remeron 30 mg HS            Increase the Cymbalta to 60 mg in AM  Medical Decision Making Problem Points:  Review of psycho-social stressors (1) Data Points:  Review of medication regiment & side effects (2) Review of  new medications or change in dosage (2)  I certify that inpatient services furnished can reasonably be expected to improve the patient's condition.   Louis Zimmerman A 02/05/2013, 5:34 PM

## 2013-02-05 NOTE — Progress Notes (Signed)
D   Pt is cooperative and pleasant on approach   He attended karoke group and participated   He came to the window ant medication time and took all of his medications and did not request any additional medications  He denies noticeable signs and symptoms of withdrawal A   Verbal support given   Medications administered and effectiveness monitored    Q 15 min checks R   Pt safe at present

## 2013-02-05 NOTE — [facility] Group Notes (Signed)
[facility] LCSW Group Therapy  02/05/2013 3:37 PM  Type of Therapy:  Group Therapy  Participation Level:  Active  Participation Quality:  Attentive  Affect:  Appropriate  Cognitive:  Appropriate  Insight:  Engaged  Engagement in Therapy:  Engaged  Modes of Intervention:  Confrontation, Discussion, Education, Exploration, Socialization and Support  Summary of Progress/Problems: Feelings around Relapse. Group members discussed the meaning of relapse and shared personal stories of relapse, how it affected them and others, and how they perceived themselves during this time. Group members were encouraged to identify triggers, warning signs and coping skills used when facing the possibility of relapse. Social supports were discussed and explored in detail. Post Acute Withdrawal Syndrome (handout provided) was introduced and examined. Pt's were encouraged to ask questions, talk about key points associated with PAWS, and process this information in terms of relapse prevention. Louis Zimmerman stated that this is his first time getting treatment for substance abuse and expressed that he is fearful of falling into a cycle of relapse and treatment. He listed family members as his primary supports and talked about the importance of getting a sponsor. He would prefer to get an AA sponsor rather than an NA sponsor and dialogued with group members about the difference between these groups/sponsors. Louis Zimmerman stated that he felt "scared" when presented with information about PAWS. He felt that having this knowledge was a "reality check" for him. Louis Zimmerman appears to be making progress regarding insight and concentration. He is able to process how having a sponsor, getting treatment i/p, and focusing on himself/recovery are crucial for his mental well being and sobriety.    Smart, Elray Dains 02/05/2013, 3:37 PM

## 2013-02-05 NOTE — [facility] Group Notes (Signed)
[facility] LCSW Aftercare Discharge Planning Group Note   02/05/2013 11:11 AM  Participation Quality:  Appropriate   Mood/Affect:  Calm  Depression Rating:  3  Anxiety Rating:  8-nervous about d/c.  Thoughts of Suicide:  No Will you contract for safety?   NA  Current AVH:  No  Plan for Discharge/Comments:  Pt has [facility] admission date for Wed. 16th and will follow up at [facility] for med management. Pt thinks that this is a good plan for him and is hoping for a smooth transition from [facility] to home to [facility].   Transportation Means: sister/family  Supports: very supportive family and church community.   Smart, [employer]

## 2013-02-05 NOTE — Tx Team (Signed)
Interdisciplinary Treatment Plan Update (Adult)  Date: 02/05/2013   Time Reviewed: 11:06 AM  Progress in Treatment:  Attending groups:Yes Participating in groups:  Yes Taking medication as prescribed: Yes  Tolerating medication: Yes  Family/Significant othe contact made: SPE completed with pt's sister Tammy.   Patient understands diagnosis: Yes, AEB seeking treatment for heroin detox and depression. Pt had recent suicide attempt but reports no SI currently.  Discussing patient identified problems/goals with staff: Yes  Medical problems stabilized or resolved: Yes  Denies suicidal/homicidal ideation: Yes during admission and pt self report.  Patient has not harmed self or Others: Yes  New problem(s) identified: n/a  Discharge Plan or Barriers: Pt has [facility] date of Wed. 16th (moved from the 17th) and will follow up at [facility] for med management. He will likely discharge on Monday and stay with his brother in law until [facility] admission.  Additional comments: Reason for Continuation of Hospitalization: Withdrawals-Clonidine taper Mood stabilization Medication Management Estimated length of stay: 2-3 days For review of initial/current patient goals, please see plan of care.  Attendees:  Patient:    Family:    Physician: Geoffery Lyons MD 02/05/2013 11:06 AM   Nursing:Brittney RN  02/05/2013 11:06 AM   Clinical Social Worker [employer], LCSWA  02/05/2013 11:06 AM   Other: Rayfield Citizen RN  02/05/2013 11:06 AM   Other: Darden Dates, Nurse CM 02/05/2013 11:06 AM   Other: Aggie PA  02/05/2013 11:06 AM   Other:    Scribe for Treatment Team:  [employer] LCSWA 02/05/2013 11:06 AM

## 2013-02-05 NOTE — Progress Notes (Signed)
Adult Psychoeducational Group Note  Date:  02/05/2013 Time:  1:31 PM  Group Topic/Focus:  Recovery Goals:   The focus of this group is to identify appropriate goals for recovery and establish a plan to achieve them.  Participation Level:  Minimal  Participation Quality:  Appropriate  Affect:  Appropriate  Cognitive:  Appropriate  Insight: Appropriate  Engagement in Group:  Limited  Modes of Intervention:  Discussion  Additional Comments:  Pt layed down on couch at the beginning of group w/ hands behind head and eyes closed.   Tora Perches N 02/05/2013, 1:31 PM

## 2013-02-06 MED ORDER — HYDROXYZINE HCL 25 MG PO TABS
25.0000 mg | ORAL_TABLET | Freq: Four times a day (QID) | ORAL | Status: AC | PRN
  Administered 2013-02-06: 25 mg via ORAL

## 2013-02-06 MED ORDER — NAPROXEN 375 MG PO TABS
375.0000 mg | ORAL_TABLET | Freq: Three times a day (TID) | ORAL | Status: DC
  Administered 2013-02-06 – 2013-02-08 (×5): 375 mg via ORAL
  Filled 2013-02-06 (×9): qty 1

## 2013-02-06 NOTE — B[facility] Group Notes (Signed)
[facility] Group Notes:  (Clinical Social Work)  02/06/2013     10-11AM  Summary of Progress/Problems:   The main focus of today's process group was for the patient to identify ways in which they have in the past sabotaged their own recovery. Motivational Interviewing was utilized to ask the group members what they get out of their substance use, and what reasons they may have for wanting to change.  The Stages of Change were explained using a handout, and patients identified where they currently are with regard to stages of change.  The patient expressed that he drinks to handle his anxiety.  He then left the room and did not return.  Type of Therapy:  Group Therapy - Process   Participation Level:  Minimal  Participation Quality:  Attentive  Affect:  Blunted  Cognitive:  Oriented  Insight:  Improving  Engagement in Therapy:  Improving  Modes of Intervention:  Education, Support and Processing, Motivational Interviewing  Ambrose Mantle, LCSW 02/06/2013, 12:13 PM

## 2013-02-06 NOTE — Progress Notes (Signed)
Patient ID: Louis Zimmerman, male   DOB: 13-May-1961, 52 y.o.   MRN: [id number] [facility] MD Progress Note  02/06/2013 12:07 PM Louis Zimmerman  MRN:  [id number] Subjective: Patient states that he is having a difficult day and one snack to some of his medication has been discontinued he reports being symptomatic with increased anxiety and restlessness and feels like he is being detoxed too quickly.  Objective: Patient's current house score is an 8. He has been trending down appropriately since his arrival. He reports restlessness and diffuse muscle aches and pains and increased anxiety.  Diagnosis:  Opioid Dependence/withdrawal, Major Depression  ADL's:  Intact  Sleep: better than the night before  Appetite:  Fair  Suicidal Ideation:  Plan:  denies Intent:  denies Means:  denies Homicidal Ideation:  Plan:  denies Intent:  denies Means:  denies AEB (as evidenced by):  Psychiatric Specialty Exam: Review of Systems  Constitutional: Negative.   HENT: Negative.   Eyes: Negative.   Respiratory: Negative.   Cardiovascular: Negative.   Gastrointestinal: Negative.   Genitourinary: Negative.   Musculoskeletal: Positive for joint pain.  Skin: Negative.   Neurological: Negative.   Endo/Heme/Allergies: Positive for polydipsia.  Psychiatric/Behavioral: Positive for depression and substance abuse. The patient is nervous/anxious and has insomnia.     Blood pressure 99/57, pulse 109, temperature 98.1 F (36.7 C), temperature source Oral, resp. rate 20, height 5\' 11"  (1.803 m), weight 64.411 kg (142 lb), SpO2 100.00%.Body mass index is 19.81 kg/(m^2).  General Appearance: Fairly Groomed  Patent attorney::  Fair  Speech:  Clear and Coherent and Slow  Volume:  Decreased  Mood:  Anxious and Depressed  Affect:  Restricted  Thought Process:  Coherent and Goal Directed  Orientation:  Full (Time, Place, and Person)  Thought Content:  worries, concerns  Suicidal Thoughts:  No  Homicidal Thoughts:   No  Memory:  Immediate;   Fair Recent;   Fair Remote;   Fair  Judgement:  Fair  Insight:  Present  Psychomotor Activity:  Restlessness  Concentration:  Fair  Recall:  Fair  Akathisia:  No  Handed:  Right  AIMS (if indicated):     Assets:  Desire for Improvement  Sleep:  Number of Hours: 5.5   Current Medications: Current Facility-Administered Medications  Medication Dose Route Frequency Provider Last Rate Last Dose  . acetaminophen (TYLENOL) tablet 650 mg  650 mg Oral Q6H PRN Court Joy, PA-C      . alum & mag hydroxide-simeth (MAALOX/MYLANTA) 200-200-20 MG/5ML suspension 30 mL  30 mL Oral Q4H PRN Court Joy, PA-C      . ARIPiprazole (ABILIFY) tablet 2 mg  2 mg Oral Daily Rachael Fee, MD   2 mg at 02/06/13 0751  . cloNIDine (CATAPRES) tablet 0.1 mg  0.1 mg Oral QAC breakfast Court Joy, PA-C   0.1 mg at 02/06/13 0865  . dicyclomine (BENTYL) tablet 20 mg  20 mg Oral Q6H PRN Court Joy, PA-C   20 mg at 02/02/13 1037  . DULoxetine (CYMBALTA) DR capsule 60 mg  60 mg Oral Daily Rachael Fee, MD   60 mg at 02/06/13 0751  . feeding supplement (ENSURE COMPLETE) liquid 237 mL  237 mL Oral TID BM Jeoffrey Massed, RD   237 mL at 02/06/13 0900  . gabapentin (NEURONTIN) capsule 400 mg  400 mg Oral TID Sanjuana Kava, NP   400 mg at 02/06/13 0751  . hydrOXYzine (ATARAX/VISTARIL) tablet 25  mg  25 mg Oral Q6H PRN Court Joy, PA-C   25 mg at 02/06/13 0142  . loperamide (IMODIUM) capsule 2-4 mg  2-4 mg Oral PRN Court Joy, PA-C      . magnesium hydroxide (MILK OF MAGNESIA) suspension 30 mL  30 mL Oral Daily PRN Court Joy, PA-C      . methocarbamol (ROBAXIN) tablet 500 mg  500 mg Oral Q8H PRN Court Joy, PA-C   500 mg at 02/02/13 1213  . mirtazapine (REMERON) tablet 30 mg  30 mg Oral QHS Rachael Fee, MD   30 mg at 02/05/13 2236  . multivitamin with minerals tablet 1 tablet  1 tablet Oral Daily Jeoffrey Massed, RD   1 tablet at 02/06/13 0751  . naproxen  (NAPROSYN) tablet 500 mg  500 mg Oral BID PRN Court Joy, PA-C   500 mg at 02/03/13 0831  . nicotine (NICODERM CQ - dosed in mg/24 hours) patch 21 mg  21 mg Transdermal Q0600 Court Joy, PA-C   21 mg at 02/06/13 0752  . ondansetron (ZOFRAN-ODT) disintegrating tablet 4 mg  4 mg Oral Q6H PRN Court Joy, PA-C      . traZODone (DESYREL) tablet 50 mg  50 mg Oral QHS PRN,MR X 1 Court Joy, PA-C   50 mg at 02/06/13 0142    Lab Results: No results found for this or any previous visit (from the past 48 hour(s)).  Physical Findings: AIMS: Facial and Oral Movements Muscles of Facial Expression: None, normal Lips and Perioral Area: None, normal Jaw: None, normal Tongue: None, normal,Extremity Movements Upper (arms, wrists, hands, fingers): None, normal Lower (legs, knees, ankles, toes): None, normal, Trunk Movements Neck, shoulders, hips: None, normal, Overall Severity Severity of abnormal movements (highest score from questions above): None, normal Incapacitation due to abnormal movements: None, normal Patient's awareness of abnormal movements (rate only patient's report): No Awareness, Dental Status Current problems with teeth and/or dentures?: No Does patient usually wear dentures?: No  CIWA:  CIWA-Ar Total: 3 COWS:  COWS Total Score: 4  Treatment Plan Summary: Daily contact with patient to assess and evaluate symptoms and progress in treatment Medication management  Plan: Supportive approach/coping skills/relapse prevention           Continue the Remeron 30 mg HS            Increase the Cymbalta to 60 mg in AM            I have reinstated one more day of Vistaril for anxiety the next 24 hours.            I have also continued naproxen sodium for continued aches and pains. We'll continue to follow. Will also continue COWS evaluation for the next 24 hours.  Medical Decision Making Problem Points:  Review of psycho-social stressors (1) Data Points:  Review of medication  regiment & side effects (2) Review of new medications or change in dosage (2)  I certify that inpatient services furnished can reasonably be expected to improve the patient's condition.  Rona Ravens. Mashburn RPAC 12:56 PM 02/06/2013 I agreed with the findings and involved in the treatment plan. Kathryne Sharper, MD

## 2013-02-06 NOTE — [facility] Group Notes (Signed)
[facility] Group Notes:  (Nursing/MHT/Case Management/Adjunct)  Date:  02/06/2013  Time:  3:53 PM  Type of Therapy:  Psychoeducational Skills  Participation Level:  Did Not Attend  Participation Quality:  none  Affect:  none  Cognitive:  none  Insight:  None  Engagement in Group:  none  Modes of Intervention:  none  Summary of Progress/Problems: Pt did not attend no reason given.   Jacquelyne Balint Shanta 02/06/2013, 3:53 PM

## 2013-02-06 NOTE — Progress Notes (Signed)
Adult Psychoeducational Group Note  Date:  02/06/2013 Time:  6:57 PM  Group Topic/Focus:  Healthy Communication:   The focus of this group is to discuss communication, barriers to communication, as well as healthy ways to communicate with others.  Participation Level:  Did Not Attend    Additional Comments:  Pt complained of sweats and a general "not feeling good". Pt slept during group time.   Tora Perches N 02/06/2013, 6:57 PM

## 2013-02-06 NOTE — Progress Notes (Signed)
D.  Pt very anxious, states he is still experiencing heavy night sweats and high anxiety.  Pt states that is the worst thing he has been through, states night time is the worst.  Pt reports body aches and insomnia as well.  Denies SI/HI/hallucinations at this time.  Interacting appropriately within milieu.  Positive for evening wrap up group.  A.  Support and encouragement offered, medication given as ordered for withdrawal and insomnia.  R.  Pt remains safe, awake in room.  No acute distress noted.  Will continue to monitor.

## 2013-02-06 NOTE — Progress Notes (Addendum)
Patient ID: KODI STEIL, male   DOB: Mar 18, 1961, 52 y.o.   MRN: [id number] D: Pt is awake and active on the unit this AM. Pt denies SI/HI and A/V hallucinations. Pt rates their depression at 3 and hopelessness at 1. Pt most recent COWS score was 8. Pt mood is depressed and his affect is anxious. Pt is c/o withdrawal symptoms, especially night sweats and agitation. Pt is currently sleeping. He attended morning group and was attentive, but states that he feels bad today and needs rest. Pt expresses some interest in remaining sober after discharge. Writer will continue to monitor.   A: Encouraged pt to discuss feelings with staff and administered medication per MD orders. Writer also encouraged pt to participate in groups.  R: Pt is attending groups and tolerating medications well. Writer will continue to monitor. 15 minute checks are ongoing for safety.

## 2013-02-06 NOTE — Progress Notes (Signed)
Adult Psychoeducational Group Note  Date:  02/06/2013 Time:  0900  Group Topic/Focus:  Making Healthy Choices:   The focus of this group is to help patients identify negative/unhealthy choices they were using prior to admission and identify positive/healthier coping strategies to replace them upon discharge. Relapse Prevention Planning:   The focus of this group is to define relapse and discuss the need for planning to combat relapse.  Participation Level:  Minimal  Participation Quality:  Appropriate  Affect:  Depressed  Cognitive:  Appropriate  Insight: Improving  Engagement in Group:  Engaged  Modes of Intervention:  Discussion and Education  Additional Comments:    Barbette Merino, Ruthell Feigenbaum Shari Prows 02/06/2013, 11:09 AM

## 2013-02-06 NOTE — Progress Notes (Signed)
D. Pt has been up throughout the evening, has been actively participating in various milieu activities. Pt did endorse feelings of anxiety and depression and spoke in detail about his circumstances and situation that has led to his hospitalization. Pt also endorsing having cold sweats and having difficulties sleeping this evening. Pt has received medications without incident and spoke about learning positive coping skills while here. A. Medication education provided, support and encouragement provided throughout the day. R. Pt verbalized understanding, will continue to monitor.

## 2013-02-07 MED ORDER — TRAZODONE HCL 100 MG PO TABS
100.0000 mg | ORAL_TABLET | Freq: Every evening | ORAL | Status: DC | PRN
  Administered 2013-02-07 (×2): 100 mg via ORAL
  Filled 2013-02-07: qty 28
  Filled 2013-02-07 (×2): qty 1

## 2013-02-07 NOTE — Progress Notes (Signed)
Patient did attend the evening speaker AA meeting.  

## 2013-02-07 NOTE — Progress Notes (Signed)
D. Pt has been up for much of the day, has spoken about still having various withdrawal symptoms, achiness, fatigue, cold sweats, irritability and anxiety. Pt is concerned that he is to be discharged tomorrow and feels that the time in-between going to the next treatment facility may be detrimental to his long term recovery. Pt did speak that he thinks he may want to go directly to treatment facility from here but also realizes and accepts that may not be a possibility. Pt has endorsed feelings of depression but has denied any SI. Pt has received all medications today without incident. A. Medication education provided, support and encouragement provided. R. Pt verbalized understanding, will continue to monitor.

## 2013-02-07 NOTE — [facility] Group Notes (Signed)
[facility] Group Notes:  (Clinical Social Work)  02/07/2013  10:00-11:00AM  Summary of Progress/Problems:   The main focus of today's process group was to   identify the patient's current support system and decide on other supports that can be put in place.  Four definitions/levels of support were discussed and an exercise was utilized to show how much stronger we become with additional supports.  An emphasis was placed on using counselor, doctor, therapy groups, 12-step groups, and problem-specific support groups to expand supports, as well as doing something different than has been done before. The patient expressed much resistance to the idea that there is any sort of future possible for him if he cannot return to his former lifestyle.  He stated emphatically that if does not get out of life what he used to have, that he will attempt suicide again, and that there is no doubt at all that the next time he will "get it right."  CSW asked for clarification and he stated, "I will succeed next time in killing myself."  He stated his family, 4 sisters, 2 daughters, mother, 4 brothers-in-law all support him.  He is willing to "go all out" in trying AA this time, and states he is having a hard time, even though his [employer] Knuckles beliefs already have him believing in a higher power, in giving up himself to that higher power.  At one point, when the person next to him continued to be as negative as he himself was being about every piece of information shared, this patient suddenly turned to the other patient and provided feedback about how unhelpful that was.  He stated there were some things discussed that he will consider about changes he may need to make.  Type of Therapy:  Process Group with Motivational Interviewing  Participation Level:  Active  Participation Quality:  Attentive, Resistant, Sharing and Supportive  Affect:  Blunted and Depressed  Cognitive:  Oriented  Insight:  Developing/Improving  Engagement in  Therapy:  Developing/Improving  Modes of Intervention:   Education, Support and Processing, Activity  Ambrose Mantle, LCSW 02/07/2013, 2:58 PM

## 2013-02-07 NOTE — Progress Notes (Signed)
[facility] Group Notes:  (Nursing/MHT/Case Management/Adjunct)  Date:  02/06/2013   Time:  2100 Type of Therapy:  wrap up group  Participation Level:  Active  Participation Quality:  Appropriate, Attentive and Sharing  Affect:  Appropriate and Irritable  Cognitive:  Alert and Appropriate  Insight:  Appropriate  Engagement in Group:  Engaged  Modes of Intervention:  Clarification, Education and Support  Summary of Progress/Problems: Pt has complaints of night sweats and not sleeping well during the night. Pt also is irritable with general complaints of other patients.  Pt shares that this is his first time trying to get clean and sober and it "concerns him". He states he only has one chance at this and is worried about the "self love" aspect.    Shelah Lewandowsky 02/07/2013, 2:30 AM

## 2013-02-07 NOTE — [facility] Group Notes (Signed)
Adult Psychoeducational Group Note   Date: 02/07/2013   Time: 1315   Group Topic/Focus:Healthy Support Systems   Making Healthy Choices: The focus of this group is to help patients identify healthy support systems  .  Participation Level: Active   Participation Quality: Attentive   Affect: Appropriate   Cognitive: Alert   Insight: Improving   Engagement in Group: Engaged   Modes of Intervention: Discussion and Education   Additional Comments

## 2013-02-07 NOTE — Progress Notes (Signed)
Patient ID: Louis Zimmerman, male   DOB: [birth year], 52 y.o.   MRN: [id number] Patient ID: Louis Zimmerman, male   DOB: November 16, 1960, 52 y.o.   MRN: [id number] [facility] MD Progress Note  02/07/2013 3:41 PM Louis Zimmerman  MRN:  [id number]  Subjective: "I think I may have something going on with me. I feel sore all over. This is what I go through every day. I'm not sleeping. I sweat all night long. My depression is at #8 today. I feel weak because I use 60- 70% of my mental capacity dealing with my pain. It is very exhausting. I guess I will be going to [facility] recovery after discharge".   Diagnosis:  Opioid Dependence/withdrawal, Major Depression  ADL's:  Intact  Sleep: better than the night before  Appetite:  Fair  Suicidal Ideation:  Plan:  denies Intent:  denies Means:  denies Homicidal Ideation:  Plan:  denies Intent:  denies Means:  denies AEB (as evidenced by):  Psychiatric Specialty Exam: Review of Systems  Constitutional: Negative.   HENT: Negative.   Eyes: Negative.   Respiratory: Negative.   Cardiovascular: Negative.   Gastrointestinal: Negative.   Genitourinary: Negative.   Musculoskeletal: Positive for joint pain.  Skin: Negative.   Neurological: Negative.   Endo/Heme/Allergies: Positive for polydipsia.  Psychiatric/Behavioral: Positive for depression and substance abuse. The patient is nervous/anxious and has insomnia.     Blood pressure 103/68, pulse 112, temperature 97 F (36.1 C), temperature source Oral, resp. rate 16, height 5\' 11"  (1.803 m), weight 64.411 kg (142 lb), SpO2 100.00%.Body mass index is 19.81 kg/(m^2).  General Appearance: Fairly Groomed  Patent attorney::  Fair  Speech:  Clear and Coherent and Slow  Volume:  Decreased  Mood:  Anxious and Depressed  Affect:  Restricted  Thought Process:  Coherent and Goal Directed  Orientation:  Full (Time, Place, and Person)  Thought Content:  worries, concerns  Suicidal Thoughts:  No  Homicidal Thoughts:   No  Memory:  Immediate;   Fair Recent;   Fair Remote;   Fair  Judgement:  Fair  Insight:  Present  Psychomotor Activity:  Restlessness  Concentration:  Fair  Recall:  Fair  Akathisia:  No  Handed:  Right  AIMS (if indicated):     Assets:  Desire for Improvement  Sleep:  Number of Hours: 3.25   Current Medications: Current Facility-Administered Medications  Medication Dose Route Frequency Provider Last Rate Last Dose  . acetaminophen (TYLENOL) tablet 650 mg  650 mg Oral Q6H PRN Court Joy, PA-C      . alum & mag hydroxide-simeth (MAALOX/MYLANTA) 200-200-20 MG/5ML suspension 30 mL  30 mL Oral Q4H PRN Court Joy, PA-C      . ARIPiprazole (ABILIFY) tablet 2 mg  2 mg Oral Daily Rachael Fee, MD   2 mg at 02/07/13 0818  . DULoxetine (CYMBALTA) DR capsule 60 mg  60 mg Oral Daily Rachael Fee, MD   60 mg at 02/07/13 0818  . feeding supplement (ENSURE COMPLETE) liquid 237 mL  237 mL Oral TID BM Jeoffrey Massed, RD   237 mL at 02/07/13 1403  . gabapentin (NEURONTIN) capsule 400 mg  400 mg Oral TID Sanjuana Kava, NP   400 mg at 02/07/13 1130  . magnesium hydroxide (MILK OF MAGNESIA) suspension 30 mL  30 mL Oral Daily PRN Court Joy, PA-C      . mirtazapine (REMERON) tablet 30 mg  30 mg Oral QHS  Rachael Fee, MD   30 mg at 02/06/13 2212  . multivitamin with minerals tablet 1 tablet  1 tablet Oral Daily Jeoffrey Massed, RD   1 tablet at 02/07/13 0818  . naproxen (NAPROSYN) tablet 375 mg  375 mg Oral TID WC Verne Spurr, PA-C   375 mg at 02/07/13 1129  . nicotine (NICODERM CQ - dosed in mg/24 hours) patch 21 mg  21 mg Transdermal Q0600 Court Joy, PA-C   21 mg at 02/07/13 0602  . traZODone (DESYREL) tablet 50 mg  50 mg Oral QHS PRN,MR X 1 Court Joy, PA-C   50 mg at 02/06/13 2328    Lab Results: No results found for this or any previous visit (from the past 48 hour(s)).  Physical Findings: AIMS: Facial and Oral Movements Muscles of Facial Expression: None,  normal Lips and Perioral Area: None, normal Jaw: None, normal Tongue: None, normal,Extremity Movements Upper (arms, wrists, hands, fingers): None, normal Lower (legs, knees, ankles, toes): None, normal, Trunk Movements Neck, shoulders, hips: None, normal, Overall Severity Severity of abnormal movements (highest score from questions above): None, normal Incapacitation due to abnormal movements: None, normal Patient's awareness of abnormal movements (rate only patient's report): No Awareness, Dental Status Current problems with teeth and/or dentures?: No Does patient usually wear dentures?: No  CIWA:  CIWA-Ar Total: 7 COWS:  COWS Total Score: 9  Treatment Plan Summary: Daily contact with patient to assess and evaluate symptoms and progress in treatment Medication management  Plan: Supportive approach/coping skills/relapse prevention. Increased Trazodone from 50 to 100 mg Q bedtime for sleep. Encouraged out of room, participation in group sessions and application of coping skills when distressed. Will continue to monitor response to/adverse effects of medications in use to assure effectiveness. Continue to monitor mood, behavior and interaction with staff and other patients. Continue current plan of care.  Medical Decision Making Problem Points:  Review of psycho-social stressors (1) Data Points:  Review of medication regiment & side effects (2) Review of new medications or change in dosage (2)  I certify that inpatient services furnished can reasonably be expected to improve the patient's condition.  Sanjuana Kava, PMHNP-BC 3:41 PM 02/07/2013 I agreed with the findings and involved in the treatment plan. Kathryne Sharper, MD

## 2013-02-08 MED ORDER — QUETIAPINE FUMARATE 50 MG PO TABS
50.0000 mg | ORAL_TABLET | Freq: Two times a day (BID) | ORAL | Status: DC
  Filled 2013-02-08: qty 1
  Filled 2013-02-08: qty 28
  Filled 2013-02-08: qty 1
  Filled 2013-02-08: qty 28

## 2013-02-08 MED ORDER — MIRTAZAPINE 30 MG PO TABS: 30.0000 mg | ORAL_TABLET | Freq: Every day | ORAL | Status: DC

## 2013-02-08 MED ORDER — QUETIAPINE FUMARATE 50 MG PO TABS
50.0000 mg | ORAL_TABLET | Freq: Once | ORAL | Status: AC
  Administered 2013-02-08: 50 mg via ORAL
  Filled 2013-02-08 (×2): qty 1

## 2013-02-08 MED ORDER — DULOXETINE HCL 60 MG PO CPEP: 60.0000 mg | ORAL_CAPSULE | Freq: Every day | ORAL | Status: DC

## 2013-02-08 MED ORDER — CELECOXIB 200 MG PO CAPS
200.0000 mg | ORAL_CAPSULE | Freq: Every day | ORAL | Status: DC
  Administered 2013-02-08: 200 mg via ORAL
  Filled 2013-02-08 (×3): qty 1

## 2013-02-08 MED ORDER — TRAZODONE HCL 100 MG PO TABS: 100.0000 mg | ORAL_TABLET | Freq: Every evening | ORAL | Status: DC | PRN

## 2013-02-08 MED ORDER — GABAPENTIN 400 MG PO CAPS: 400.0000 mg | ORAL_CAPSULE | Freq: Three times a day (TID) | ORAL | Status: DC

## 2013-02-08 MED ORDER — METHOCARBAMOL 500 MG PO TABS
500.0000 mg | ORAL_TABLET | Freq: Three times a day (TID) | ORAL | Status: DC
  Administered 2013-02-08: 500 mg via ORAL
  Filled 2013-02-08: qty 1
  Filled 2013-02-08 (×2): qty 21
  Filled 2013-02-08 (×5): qty 1
  Filled 2013-02-08: qty 21

## 2013-02-08 MED ORDER — METHOCARBAMOL 500 MG PO TABS: 500.0000 mg | ORAL_TABLET | Freq: Three times a day (TID) | ORAL | Status: DC

## 2013-02-08 MED ORDER — QUETIAPINE FUMARATE 50 MG PO TABS: 50.0000 mg | ORAL_TABLET | Freq: Two times a day (BID) | ORAL | Status: DC

## 2013-02-08 NOTE — Progress Notes (Signed)
Psychoeducational Group Note  Date:  02/08/2013 Time:  1100  Group Topic/Focus:  Wellness Toolbox:   The focus of this group is to discuss various aspects of wellness, balancing those aspects and exploring ways to increase the ability to experience wellness.  Patients will create a wellness toolbox for use upon discharge.  Participation Level: Did Not Attend  Participation Quality:  Not Applicable  Affect:  Not Applicable  Cognitive:  Not Applicable  Insight:  Not Applicable  Engagement in Group: Not Applicable  Additional Comments:  Pt did not attend group.   Sharyn Lull 02/08/2013, 11:49 AM

## 2013-02-08 NOTE — [facility] Suicide Risk Assessment (Signed)
Suicide Risk Assessment  Discharge Assessment     Demographic Factors:  Male, caucasian  Mental Status Per Nursing Assessment::   On Admission:  Self-harm thoughts;Self-harm behaviors  Current Mental Status by Physician: In full contact with reality. There are no suicidal ideas, plans or intent. His mood is anxious, his affect is appropriate. He is willing and motivated to pursue outpatient follow up. He states that the medications are helping his mood, that he has not been this stable mood wise in a long time  Loss Factors: NA  Historical Factors: NA  Risk Reduction Factors:   Living with another person, especially a relative and Positive social support  Continued Clinical Symptoms:  Depression:   Comorbid alcohol abuse/dependence Alcohol/Substance Abuse/Dependencies Anxiety Cognitive Features That Contribute To Risk: None Identified   Suicide Risk:  Minimal: No identifiable suicidal ideation.  Patients presenting with no risk factors but with morbid ruminations; may be classified as minimal risk based on the severity of the depressive symptoms  Discharge Diagnoses:   AXIS I:  Opioid Dependence, Major Depression, Anxiety Disorder NOS AXIS II:  Deferred AXIS III:   Past Medical History  Diagnosis Date  . Depression    AXIS IV:  other psychosocial or environmental problems AXIS V:  61-70 mild symptoms  Plan Of Care/Follow-up recommendations:  Activity:  as tolerated Diet:  regular Follow up [facility] Is patient on multiple antipsychotic therapies at discharge:  No   Has Patient had three or more failed trials of antipsychotic monotherapy by history:  No  Recommended Plan for Multiple Antipsychotic Therapies: N/A   Eleuterio Dollar A 02/08/2013, 12:24 PM

## 2013-02-08 NOTE — Progress Notes (Signed)
Patient ID: Louis Zimmerman, male   DOB: May 22, 1961, 52 y.o.   MRN: [id number] D)  D)  Has been out and about on the unit this evening, states is having a hard time being inside for such a long time, hopeful a group will be held outside tomorrow.  Feels anxious, still dealing with w/d sx, has episodes of sweating and shakiness.  States he is afraid that if he leaves tomorrow he will relapse, has arranged for his brother-in-law to stay with him until he goes to [facility] on Thursday if he is discharged tomorrow, and he is to keep him from giving in to his temptations.  States still deals with constant pain, had been in [employer] when younger, always active in sports, now dealing with the pain from old injuries.  Wondering about his options. A)  Encouraged to discuss with MD tomorrow, support, will continue to monitor for safety, continue POC. R)  Receptive, appreciative, safety maintained.

## 2013-02-08 NOTE — Discharge Summary (Signed)
Physician Discharge Summary Note  Patient:  Louis Zimmerman is an 52 y.o., male MRN:  [id number] DOB:  08-14-1960 Patient phone:  [phone] (home)  Patient address:   [address] [place] [address],   Date of Admission:  02/01/2013  Date of Discharge: 02/08/13  Reason for Admission:  Opioid withdrawal symptoms  Discharge Diagnoses: Active Problems:   Opioid dependence   Depression   Opioid use with withdrawal  Review of Systems  Constitutional: Negative.   HENT: Negative.   Eyes: Negative.   Respiratory: Negative.   Cardiovascular: Negative.   Gastrointestinal: Negative.   Genitourinary: Negative.   Musculoskeletal: Negative.   Skin: Negative.   Neurological: Negative.   Endo/Heme/Allergies: Negative.   Psychiatric/Behavioral: Positive for depression (Stabilized with medication prior to discharge) and substance abuse (Opioid dependence). Negative for suicidal ideas, hallucinations and memory loss. The patient is nervous/anxious (Stabilized with medication prior to discharge) and has insomnia (Stabilized with medication prior to discharge).    Axis Diagnosis:   AXIS I:  Opioid dependence, Major depression AXIS II:  Deferred AXIS III:   Past Medical History  Diagnosis Date  . Depression    AXIS IV:  other psychosocial or environmental problems AXIS V:  63  Level of Care:  [facility]  Hospital Course:  This is a 52 year old Caucasian Male. Admitted to [facility] from the [facility] with complaints of drug of dose on Heroin. Patient reports, "The ambulance took me to the hospital 3 days ago because I attempted to overdose on Heroin. I felt worn out from life. I do abuse drugs, mostly heroin and cocaine. I have been using pain pills and cocaine x 6 years. But for the last 2 years, I added heroin use. Drugs make feel good. They give me energy when I did not have it. I have not been through any drug rehab in the past or present. I see Dr, Donell Beers on an outpatient basis.  I don't like him. I would not want to go back to seeing him".  Upon admission in this hospital, and after admission assessment/evaluation, it was determined that Louis Zimmerman will need detoxification treatment protocol to stabilize his systems of drug intoxication and to combat the withdrawal symptoms of these substances as well.He maintained that he has been on opiates for over a long period of time, because he has severe chronic pain. He was then started on clonidine detoxification treatment protocol for his opiate detoxification. He was also enrolled in group counseling sessions and activities to learn coping skills that should help him after discharge to cope better with his symptoms and manage his substance abuse/dependency issues for a much sustained sobreity. He also was enrolled/participated in the AA/NA meetings being offered and held on this unit. He has or rather presented with previously existing and or identifiable medical conditions that required treatment and or monitoring such as again his chronic pain episodes. He received medication management for those health issues. He was monitored closely for any potential problems that may arise as a result of and or during detoxification treatment. Patient tolerated his detoxification treatment without any significant adverse effects and or reactions reported and or presented.  Patient attended treatment team meeting this am and met with the treatment team members. His reason for admission, present symptoms, substance abuse issues, response to treatment and discharge plans discussed. Patient endorsed that he is doing well and stable for discharge to pursue the next phase of his substance abuse treatment. It was  then agreed upon between patient and the team that he will be discharged to continue substance abuse treatment at the [facility] on [address] on 02/11/13 at 08:00 am. And for routine psychiatric care, he will follow-up at [facility] here  in [place], [place]. Louis Zimmerman has been instructed that this a walk-in appointment between the hours of 08:00 and 09:00 am.The addresses, times, dates and contact information for this clinic/[facility] treatment [facility] provided for patient in writing.   Upon discharge, patient adamantly denies suicidal, homicidal ideations, auditory, visual hallucinations, delusional thoughts, paranoia and or withdrawal symptoms. Patient left [facility] with all personal belongings in no apparent distress. He received 2 weeks worth supply samples of his [facility] discharged medications, including a 30 days worth prescriptions for all his discharge medications. Transportation per patient arrangement.  Consults:  psychiatry  Significant Diagnostic Studies:  labs: CBC with diff, CMP, UDS, Toxicology tests, U/A  Discharge Vitals:   Blood pressure 115/65, pulse 111, temperature 97.9 F (36.6 C), temperature source Oral, resp. rate 18, height 5\' 11"  (1.803 m), weight 64.411 kg (142 lb), SpO2 100.00%. Body mass index is 19.81 kg/(m^2). Lab Results:   No results found for this or any previous visit (from the past 72 hour(s)).  Physical Findings: AIMS: Facial and Oral Movements Muscles of Facial Expression: None, normal Lips and Perioral Area: None, normal Jaw: None, normal Tongue: None, normal,Extremity Movements Upper (arms, wrists, hands, fingers): None, normal Lower (legs, knees, ankles, toes): None, normal, Trunk Movements Neck, shoulders, hips: None, normal, Overall Severity Severity of abnormal movements (highest score from questions above): None, normal Incapacitation due to abnormal movements: None, normal Patient's awareness of abnormal movements (rate only patient's report): No Awareness, Dental Status Current problems with teeth and/or dentures?: No Does patient usually wear dentures?: No  CIWA:  CIWA-Ar Total: 6 COWS:  COWS Total Score: 9  Psychiatric Specialty Exam: See Psychiatric Specialty Exam and Suicide  Risk Assessment completed by Attending Physician prior to discharge.  Discharge destination:  Home, then to [facility]  Is patient on multiple antipsychotic therapies at discharge:  No   Has Patient had three or more failed trials of antipsychotic monotherapy by history:  No  Recommended Plan for Multiple Antipsychotic Therapies: NA     Medication List       Indication   DULoxetine 60 MG capsule  Commonly known as:  CYMBALTA  Take 1 capsule (60 mg total) by mouth daily. For depression   Indication:  Generalized Anxiety Disorder, Major Depressive Disorder     gabapentin 400 MG capsule  Commonly known as:  NEURONTIN  Take 1 capsule (400 mg total) by mouth 3 (three) times daily. For anxiety/pain management   Indication:  Agitation, Neuropathic Pain, Pain, Anxiety     methocarbamol 500 MG tablet  Commonly known as:  ROBAXIN  Take 1 tablet (500 mg total) by mouth 3 (three) times daily. For muscle pain   Indication:  Musculoskeletal Pain     mirtazapine 30 MG tablet  Commonly known as:  REMERON  Take 1 tablet (30 mg total) by mouth at bedtime. For depression   Indication:  Major Depressive Disorder     QUEtiapine 50 MG tablet  Commonly known as:  SEROQUEL  Take 1 tablet (50 mg total) by mouth 2 (two) times daily. For anxiety/mood control   Indication:  Anxiety management     traZODone 100 MG tablet  Commonly known as:  DESYREL  Take 1 tablet (100 mg total) by mouth at bedtime  as needed and may repeat dose one time if needed for sleep. For sleep   Indication:  Trouble Sleeping       Follow-up Information   Follow up with [facility]. (Walk in Monday through Friday between 8AM-9AM for hospital followup/medication management. )    Contact information:   [address], [place] [address] phone: [phone] fax: [phone]      Follow up with [facility] On 02/11/2013. (Arrive by 8AM with ID, 30 day med supply, and 5 days worth of clothing. )    Contact  information:   [address] [place], [place] [address] phone: [phone] fax: [phone]     Follow-up recommendations:  Activity:  As tolerated Diet: As recommended by your primary care doctor. Keep all scheduled follow-up appointments as recommended. Continue to work your relapse prevention plan Comments: Take all your medications as prescribed by your mental healthcare provider. Report any adverse effects and or reactions from your medicines to your outpatient provider promptly. Patient is instructed and cautioned to not engage in alcohol and or illegal drug use while on prescription medicines. In the event of worsening symptoms, patient is instructed to call the crisis hotline, 911 and or go to the nearest ED for appropriate evaluation and treatment of symptoms. Follow-up with your primary care provider for your other medical issues, concerns and or health care needs.    Total Discharge Time:  Greater than 30 minutes.  Signed: Sanjuana Kava, PMHNP-BC 02/08/2013, 12:27 PM Agree with assessment and plan Reymundo Poll. Dub Mikes, M.D.

## 2013-02-08 NOTE — Progress Notes (Signed)
[facility] Adult Case Management Discharge Plan :  Will you be returning to the same living situation after discharge: Yes,  pt will return home until [facility] admit date At discharge, do you have transportation home?:Yes,  brother in law Do you have the ability to pay for your medications:Yes,  mental health  Release of information consent forms completed and in the chart;  Patient's signature needed at discharge.  Patient to Follow up at: Follow-up Information   Follow up with [facility]. (Walk in Monday through Friday between 8AM-9AM for hospital followup/medication management. )    Contact information:   [address], [place] [address] phone: [phone] fax: [phone]      Follow up with [facility] On 02/11/2013. (Arrive by 8AM with ID, 30 day med supply, and 5 days worth of clothing. )    Contact information:   [address] [place], [place] [address] phone: [phone] fax: [phone]      Patient denies SI/HI:   Yes,  during group/self report    Safety Planning and Suicide Prevention discussed:  Yes,  SPE completed with Tammy, pt's sister. Pt provided with SPI pamphlet and encouraged to ask questions and voice concerns.  Smart, Unika Nazareno 02/08/2013, 10:28 AM

## 2013-02-08 NOTE — BHH Group Notes (Signed)
[facility] LCSW Aftercare Discharge Planning Group Note   02/08/2013 10:26 AM  Participation Quality:  Appropriate/Engaged  Mood/Affect:  Appropriate  Depression Rating:  1  Anxiety Rating:  10-regarding today's discharge. Pt rates high anxiety but feels hopeful about recovery and is happy with d/c plan.   Thoughts of Suicide:  No Will you contract for safety?   NA  Current AVH:  No  Plan for Discharge/Comments:  Pt has [facility] admit date of Thurs 7/17 and will follow up at [facility] for med management. Pt also has supportive family members that will be spending time with him until his admission date and taking him to [facility] to get medications.   Transportation Means: brother in law  Supports: sisters/brotherinlaw/mother/daughters--very supportive family and church/community supports.  Smart, [employer]

## 2013-02-08 NOTE — Progress Notes (Signed)
Patient ID: Louis Zimmerman, male   DOB: 04-17-1961, 52 y.o.   MRN: [id number] Pt was discharged at 13:14 to   go to his brothers home. He voiced understanding of discharge instruction and of the follow up plan. He denies thought of SI. All belongings taken home with him. Discharge teaching don for fall risk and medication instruction.

## 2013-02-10 NOTE — Progress Notes (Signed)
Patient Discharge Instructions:  After Visit Summary (AVS):   Faxed to:  02/10/13 Discharge Summary Note:   Faxed to:  02/10/13 Psychiatric Admission Assessment Note:   Faxed to:  02/10/13 Suicide Risk Assessment - Discharge Assessment:   Faxed to:  02/10/13 Faxed/Sent to the Next Level Care provider:  02/10/13 Faxed to [facility] @ [phone] Faxed to [facility] @ [phone]  Jerelene Redden, 02/10/2013, 1:41 PM

## 2013-02-13 ENCOUNTER — Emergency Department (HOSPITAL_COMMUNITY)
Admission: EM | Admit: 2013-02-13 | Discharge: 2013-02-14 | Disposition: A | Discharge status: Home / Self Care | Payer: Self-pay | Attending: Emergency Medicine | Admitting: Emergency Medicine

## 2013-02-13 DIAGNOSIS — Z79899 Other long term (current) drug therapy: Secondary | ICD-10-CM | POA: Insufficient documentation

## 2013-02-13 DIAGNOSIS — I951 Orthostatic hypotension: Secondary | ICD-10-CM | POA: Insufficient documentation

## 2013-02-13 DIAGNOSIS — R55 Syncope and collapse: Secondary | ICD-10-CM | POA: Insufficient documentation

## 2013-02-13 DIAGNOSIS — E860 Dehydration: Secondary | ICD-10-CM | POA: Insufficient documentation

## 2013-02-13 DIAGNOSIS — F17200 Nicotine dependence, unspecified, uncomplicated: Secondary | ICD-10-CM | POA: Insufficient documentation

## 2013-02-13 DIAGNOSIS — F3289 Other specified depressive episodes: Secondary | ICD-10-CM | POA: Insufficient documentation

## 2013-02-13 DIAGNOSIS — F411 Generalized anxiety disorder: Secondary | ICD-10-CM | POA: Insufficient documentation

## 2013-02-13 DIAGNOSIS — M542 Cervicalgia: Secondary | ICD-10-CM | POA: Insufficient documentation

## 2013-02-13 DIAGNOSIS — F329 Major depressive disorder, single episode, unspecified: Secondary | ICD-10-CM | POA: Insufficient documentation

## 2013-02-13 NOTE — ED Notes (Addendum)
Pt is here via EMS. Pt is staying at a rehab center and he told EMS that he went outside to smoke and then the next thing he remembers he woke up on the ground. Pt is c/o sore neck.

## 2013-02-13 NOTE — ED Notes (Signed)
Bed:WA01<BR> Expected date:<BR> Expected time:<BR> Means of arrival:<BR> Comments:<BR> EMS

## 2013-02-14 ENCOUNTER — Emergency Department (HOSPITAL_COMMUNITY): Payer: Self-pay

## 2013-02-14 ENCOUNTER — Encounter (HOSPITAL_COMMUNITY): Payer: Self-pay

## 2013-02-14 LAB — CBC WITH DIFFERENTIAL/PLATELET
Basophils Relative: 0 % (ref 0–1)
Eosinophils Absolute: 0.3 10*3/uL (ref 0.0–0.7)
Eosinophils Relative: 2 % (ref 0–5)
HCT: 31.9 % — ABNORMAL LOW (ref 39.0–52.0)
Hemoglobin: 10.5 g/dL — ABNORMAL LOW (ref 13.0–17.0)
MCH: 29.7 pg (ref 26.0–34.0)
MCHC: 32.9 g/dL (ref 30.0–36.0)
MCV: 90.4 fL (ref 78.0–100.0)
Monocytes Absolute: 0.9 10*3/uL (ref 0.1–1.0)
Monocytes Relative: 5 % (ref 3–12)
Neutro Abs: 15.1 10*3/uL — ABNORMAL HIGH (ref 1.7–7.7)
RDW: 15.1 % (ref 11.5–15.5)

## 2013-02-14 LAB — BASIC METABOLIC PANEL
BUN: 12 mg/dL (ref 6–23)
Calcium: 8.4 mg/dL (ref 8.4–10.5)
Chloride: 104 mEq/L (ref 96–112)
Creatinine, Ser: 0.85 mg/dL (ref 0.50–1.35)
GFR calc Af Amer: >90 mL/min (ref >90)

## 2013-02-14 LAB — URINALYSIS, ROUTINE W REFLEX MICROSCOPIC
Bilirubin Urine: NEGATIVE
Hgb urine dipstick: NEGATIVE
Ketones, ur: NEGATIVE mg/dL
Specific Gravity, Urine: 1.012 (ref 1.005–1.030)
Urobilinogen, UA: 0.2 mg/dL (ref 0.0–1.0)

## 2013-02-14 MED ORDER — SODIUM CHLORIDE 0.9 % IV SOLN: 1000.0000 mL | Freq: Once | INTRAVENOUS | Status: DC

## 2013-02-14 MED ORDER — SODIUM CHLORIDE 0.9 % IV SOLN
1000.0000 mL | Freq: Once | INTRAVENOUS | Status: AC
  Administered 2013-02-14: 1000 mL via INTRAVENOUS

## 2013-02-14 MED ORDER — SODIUM CHLORIDE 0.9 % IV SOLN
1000.0000 mL | INTRAVENOUS | Status: DC
  Administered 2013-02-14: 1000 mL via INTRAVENOUS

## 2013-02-14 NOTE — ED Provider Notes (Signed)
History    CSN: [id number] Arrival date & time 02/13/13  2355  First MD Initiated Contact with Patient 02/14/13 0019     Chief Complaint  Patient presents with  . Near Syncope   (Consider location/radiation/quality/duration/timing/severity/associated sxs/prior Treatment) HPI 52 yo male presents to the ER via EMS from rehab center after syncopal episode.  Pt reports he was sitting on a picnic bench smoking a cigarette and woke up on the ground.  Pt c/o neck pain.  No prior h/o syncope.  Pt reports 40 lb weight loss over a year.  Pt is upset that he is not getting enough to eat and drink at the rehab facility.  Pt reports he feels dehydrated.  No sob, no chest pain, no abd pain, no blood in stool.  Pt recently started on seroquel 200 mg and remeron 30 mg both qhs for anxiety and sleep.  Pt reports first dose was yesterday.  Medication taken around 10 pm, syncope at 11 pm.  Pt reports feeling very sleepy.  Past Medical History  Diagnosis Date  . Depression    History reviewed. No pertinent past surgical history. No family history on file. History  Substance Use Topics  . Smoking status: Current Every Day Smoker -- 1.00 packs/day    Types: Cigarettes  . Smokeless tobacco: Not on file  . Alcohol Use: No    Review of Systems  All other systems reviewed and are negative.    Allergies  Review of patient's allergies indicates no known allergies.  Home Medications   Current Outpatient Rx  Name  Route  Sig  Dispense  Refill  . DULoxetine (CYMBALTA) 60 MG capsule   Oral   Take 1 capsule (60 mg total) by mouth daily. For depression   30 capsule   0   . gabapentin (NEURONTIN) 400 MG capsule   Oral   Take 1 capsule (400 mg total) by mouth 3 (three) times daily. For anxiety/pain management   90 capsule   0   . methocarbamol (ROBAXIN) 500 MG tablet   Oral   Take 1 tablet (500 mg total) by mouth 3 (three) times daily. For muscle pain   90 tablet   0   . mirtazapine  (REMERON) 30 MG tablet   Oral   Take 1 tablet (30 mg total) by mouth at bedtime. For depression   30 tablet   0   . QUEtiapine (SEROQUEL) 50 MG tablet   Oral   Take 1 tablet (50 mg total) by mouth 2 (two) times daily. For anxiety/mood control   60 tablet   0   . traZODone (DESYREL) 100 MG tablet   Oral   Take 1 tablet (100 mg total) by mouth at bedtime as needed and may repeat dose one time if needed for sleep. For sleep   60 tablet   0    BP 108/65  Pulse 88  Temp(Src) 97.9 F (36.6 C) (Oral)  Resp 20  SpO2 98% Physical Exam  Nursing note and vitals reviewed. Constitutional: He is oriented to person, place, and time. He appears well-developed and well-nourished.  HENT:  Head: Normocephalic and atraumatic.  Right Ear: External ear normal.  Left Ear: External ear normal.  Nose: Nose normal.  Mouth/Throat: Oropharynx is clear and moist.  Eyes: Conjunctivae and EOM are normal. Pupils are equal, round, and reactive to light.  Neck: Normal range of motion. Neck supple. No JVD present. No tracheal deviation present. No thyromegaly present.  ccollar in  place.  Diffuse posterior neck pain, no step off or crepitus  Cardiovascular: Normal rate, regular rhythm, normal heart sounds and intact distal pulses.  Exam reveals no gallop and no friction rub.   No murmur heard. Pulmonary/Chest: Effort normal and breath sounds normal. No stridor. No respiratory distress. He has no wheezes. He has no rales. He exhibits no tenderness.  Abdominal: Soft. Bowel sounds are normal. He exhibits no distension and no mass. There is no tenderness. There is no rebound and no guarding.  Musculoskeletal: Normal range of motion. He exhibits no edema and no tenderness.  Lymphadenopathy:    He has no cervical adenopathy.  Neurological: He is oriented to person, place, and time. He has normal reflexes. No cranial nerve deficit. He exhibits normal muscle tone. Coordination normal.  Somnolent but arousable   Skin: Skin is warm and dry. No rash noted. No erythema. No pallor.  Psychiatric: He has a normal mood and affect. His behavior is normal. Judgment and thought content normal.    ED Course  Procedures (including critical care time) Labs Reviewed  CBC WITH DIFFERENTIAL - Abnormal; Notable for the following:    WBC 18.6 (*)    RBC 3.53 (*)    Hemoglobin 10.5 (*)    HCT 31.9 (*)    Neutrophils Relative % 81 (*)    Neutro Abs 15.1 (*)    All other components within normal limits  BASIC METABOLIC PANEL - Abnormal; Notable for the following:    Glucose, Bld 118 (*)    All other components within normal limits  TROPONIN I  URINALYSIS, ROUTINE W REFLEX MICROSCOPIC   Ct Head Wo Contrast  02/14/2013   *RADIOLOGY REPORT*  Clinical Data:  Near syncope, fall  CT HEAD WITHOUT CONTRAST CT CERVICAL SPINE WITHOUT CONTRAST  Technique:  Multidetector CT imaging of the head and cervical spine was performed following the standard protocol without intravenous contrast.  Multiplanar CT image reconstructions of the cervical spine were also generated.  Comparison:  Prior CT from 06/20/1999  CT HEAD  Findings: There is no acute intracranial hemorrhage or infarct.  No midline shift or mass lesion.  No extra-axial fluid collection. Gray-white matter differentiation is preserved.  CSF containing spaces are normal.  The calvarium is intact.  Minimal mucoperiosteal thickening is noted within the right maxillary sinus as well as the ethmoidal air cells bilaterally. Mastoid air cells are clear.  Orbits are unremarkable.  IMPRESSION: Normal head CT.  No acute intracranial process.  CT CERVICAL SPINE  Findings: There is no acute fracture or listhesis.  Vertebral bodies are normally aligned.  Normal C1-2 articulations are intact. There is no prevertebral soft tissue swelling.  Facets are well- aligned.  Mild degenerative disc disease is present at C5-6 and C6-7 with prominent endplate osteophytosis at these levels.   Emphysematous changes are noted within the partially visualized lungs.  IMPRESSION: 1.  No acute fracture or listhesis. 2.  Degenerative disc disease as above, most prominent at C5-6 and C6-7.   Original Report Authenticated By: Rise Mu, M.D.   Ct Cervical Spine Wo Contrast  02/14/2013   *RADIOLOGY REPORT*  Clinical Data:  Near syncope, fall  CT HEAD WITHOUT CONTRAST CT CERVICAL SPINE WITHOUT CONTRAST  Technique:  Multidetector CT imaging of the head and cervical spine was performed following the standard protocol without intravenous contrast.  Multiplanar CT image reconstructions of the cervical spine were also generated.  Comparison:  Prior CT from 06/20/1999  CT HEAD  Findings: There is  no acute intracranial hemorrhage or infarct.  No midline shift or mass lesion.  No extra-axial fluid collection. Gray-white matter differentiation is preserved.  CSF containing spaces are normal.  The calvarium is intact.  Minimal mucoperiosteal thickening is noted within the right maxillary sinus as well as the ethmoidal air cells bilaterally. Mastoid air cells are clear.  Orbits are unremarkable.  IMPRESSION: Normal head CT.  No acute intracranial process.  CT CERVICAL SPINE  Findings: There is no acute fracture or listhesis.  Vertebral bodies are normally aligned.  Normal C1-2 articulations are intact. There is no prevertebral soft tissue swelling.  Facets are well- aligned.  Mild degenerative disc disease is present at C5-6 and C6-7 with prominent endplate osteophytosis at these levels.  Emphysematous changes are noted within the partially visualized lungs.  IMPRESSION: 1.  No acute fracture or listhesis. 2.  Degenerative disc disease as above, most prominent at C5-6 and C6-7.   Original Report Authenticated By: Rise Mu, M.D.    Date: 02/14/2013  Rate:78  Rhythm: normal sinus rhythm  QRS Axis: left  Intervals: normal  ST/T Wave abnormalities: normal  Conduction Disutrbances:left anterior  fascicular block  Narrative Interpretation:   Old EKG Reviewed: none available    1. Syncope   2. Dehydration   3. Orthostatic hypotension     MDM  52 yo male with syncope, noted to have significant orthostatic hypotension.  Suspect combination of dehydration and medications.  After IVF,pt much improved.   Olivia Mackie, MD 02/14/13 [phone]

## 2013-02-14 NOTE — ED Notes (Addendum)
Patient is alert and oriented x3.  He was given DC instructions and follow up visit instructions.  Patient gave verbal understanding.  He was DC ambulatory under his own power to [facility].  V/S stable.  He was not showing any signs of distress on DC

## 2013-02-14 NOTE — ED Notes (Signed)
Notified pt that urine is needed.  Urinal left at bedside

## 2013-04-21 ENCOUNTER — Encounter (HOSPITAL_COMMUNITY): Payer: Self-pay | Admitting: Emergency Medicine

## 2013-04-21 ENCOUNTER — Emergency Department (HOSPITAL_COMMUNITY)
Admission: EM | Admit: 2013-04-21 | Discharge: 2013-04-21 | Disposition: A | Discharge status: Home / Self Care | Payer: Self-pay | Attending: Emergency Medicine | Admitting: Emergency Medicine

## 2013-04-21 DIAGNOSIS — R509 Fever, unspecified: Secondary | ICD-10-CM | POA: Insufficient documentation

## 2013-04-21 DIAGNOSIS — L0291 Cutaneous abscess, unspecified: Secondary | ICD-10-CM

## 2013-04-21 DIAGNOSIS — Z79899 Other long term (current) drug therapy: Secondary | ICD-10-CM | POA: Insufficient documentation

## 2013-04-21 DIAGNOSIS — L02413 Cutaneous abscess of right upper limb: Secondary | ICD-10-CM

## 2013-04-21 DIAGNOSIS — F17200 Nicotine dependence, unspecified, uncomplicated: Secondary | ICD-10-CM | POA: Insufficient documentation

## 2013-04-21 DIAGNOSIS — F3289 Other specified depressive episodes: Secondary | ICD-10-CM | POA: Insufficient documentation

## 2013-04-21 DIAGNOSIS — F329 Major depressive disorder, single episode, unspecified: Secondary | ICD-10-CM | POA: Insufficient documentation

## 2013-04-21 DIAGNOSIS — IMO0002 Reserved for concepts with insufficient information to code with codable children: Secondary | ICD-10-CM | POA: Insufficient documentation

## 2013-04-21 MED ORDER — IBUPROFEN 800 MG PO TABS
800.0000 mg | ORAL_TABLET | Freq: Once | ORAL | Status: AC
  Administered 2013-04-21: 800 mg via ORAL
  Filled 2013-04-21: qty 1

## 2013-04-21 MED ORDER — IBUPROFEN 800 MG PO TABS: 800.0000 mg | ORAL_TABLET | Freq: Three times a day (TID) | ORAL | Status: DC

## 2013-04-21 MED ORDER — LIDOCAINE HCL 1 % IJ SOLN
30.0000 mL | Freq: Once | INTRAMUSCULAR | Status: AC
  Administered 2013-04-21: 30 mL
  Filled 2013-04-21: qty 40

## 2013-04-21 MED ORDER — CLINDAMYCIN HCL 150 MG PO CAPS: 150.0000 mg | ORAL_CAPSULE | Freq: Three times a day (TID) | ORAL | Status: DC

## 2013-04-21 MED ORDER — HYDROCODONE-ACETAMINOPHEN 5-325 MG PO TABS
2.0000 | ORAL_TABLET | Freq: Once | ORAL | Status: AC
  Administered 2013-04-21: 2 via ORAL
  Filled 2013-04-21: qty 2

## 2013-04-21 MED ORDER — CLINDAMYCIN HCL 300 MG PO CAPS
450.0000 mg | ORAL_CAPSULE | Freq: Once | ORAL | Status: AC
  Administered 2013-04-21: 450 mg via ORAL
  Filled 2013-04-21: qty 1

## 2013-04-21 NOTE — ED Notes (Signed)
Per pt, was bite by a spider 3 weeks ago-has been on oral antibiotics for 5 days-has not gotten better

## 2013-04-21 NOTE — ED Notes (Signed)
Patient took a pill from his personal bag after receiving Vicodin 2 tabs immediately after writer's back was turned. Patient was observed by the Clinical research associate. Patient stated that he took Neurontin.

## 2013-04-21 NOTE — Progress Notes (Signed)
[facility] provided pt with a list of primary care resources. Patient stated that he was pending insurance through job.  °

## 2013-04-21 NOTE — ED Provider Notes (Signed)
CSN: [id number]     Arrival date & time 04/21/13  1149 History   First MD Initiated Contact with Patient 04/21/13 1206     Chief Complaint  Patient presents with  . Abscess   (Consider location/radiation/quality/duration/timing/severity/associated sxs/prior Treatment) HPI Comments: 52 yo male with right arm swelling and pain.  Gradually worsening for 3 wks.  Started as spider bite appearing.  Pain with palpation.  Recent erythema.  Pt on day 5 doxycycline.  Pt has not had I and D, no known mrsa hx.    Patient is a 52 y.o. male presenting with abscess. The history is provided by the patient.  Abscess Associated symptoms: fever   Associated symptoms: no headaches and no vomiting     Past Medical History  Diagnosis Date  . Depression    History reviewed. No pertinent past surgical history. No family history on file. History  Substance Use Topics  . Smoking status: Current Every Day Smoker -- 1.00 packs/day    Types: Cigarettes  . Smokeless tobacco: Not on file  . Alcohol Use: No    Review of Systems  Constitutional: Positive for fever. Negative for chills.  HENT: Negative for neck pain and neck stiffness.   Eyes: Negative for visual disturbance.  Respiratory: Negative for shortness of breath.   Cardiovascular: Negative for chest pain.  Gastrointestinal: Negative for vomiting and abdominal pain.  Genitourinary: Negative for dysuria and flank pain.  Musculoskeletal: Negative for back pain.  Skin: Positive for rash and wound.  Neurological: Negative for light-headedness and headaches.    Allergies  Review of patient's allergies indicates no known allergies.  Home Medications   Current Outpatient Rx  Name  Route  Sig  Dispense  Refill  . buprenorphine-naloxone (SUBOXONE) 8-2 MG SUBL SL tablet   Sublingual   Place 1 tablet under the tongue daily.         . DULoxetine (CYMBALTA) 30 MG capsule   Oral   Take 90 mg by mouth daily.         Marland Kitchen gabapentin (NEURONTIN) 400  MG capsule   Oral   Take 1 capsule (400 mg total) by mouth 3 (three) times daily. For anxiety/pain management   90 capsule   0   . methocarbamol (ROBAXIN) 500 MG tablet   Oral   Take 1 tablet (500 mg total) by mouth 3 (three) times daily. For muscle pain   90 tablet   0   . mirtazapine (REMERON) 30 MG tablet   Oral   Take 1 tablet (30 mg total) by mouth at bedtime. For depression   30 tablet   0   . QUEtiapine (SEROQUEL) 200 MG tablet   Oral   Take 400 mg by mouth at bedtime.         . traZODone (DESYREL) 100 MG tablet   Oral   Take 1 tablet (100 mg total) by mouth at bedtime as needed and may repeat dose one time if needed for sleep. For sleep   60 tablet   0    BP 153/100  Pulse 83  Temp(Src) 98.1 F (36.7 C) (Oral)  Resp 18  SpO2 100% Physical Exam  Nursing note and vitals reviewed. Constitutional: He is oriented to person, place, and time. He appears well-developed and well-nourished.  HENT:  Head: Normocephalic and atraumatic.  Eyes: Conjunctivae are normal. Right eye exhibits no discharge. Left eye exhibits no discharge.  Neck: Normal range of motion. Neck supple. No tracheal deviation present.  Cardiovascular:  Normal rate and regular rhythm.   Pulmonary/Chest: Effort normal.  Musculoskeletal: He exhibits edema and tenderness.  Mild swelling and erythema right dorsal forearm, mild warmth, crusting at distal region of induration, no active drainage, no crepitus, nv intact, soft compartment  Neurological: He is alert and oriented to person, place, and time.  Skin: Skin is warm. No rash noted.  Psychiatric: He has a normal mood and affect.    ED Course  Procedures (including critical care time) INCISION AND DRAINAGE Performed by: Enid Skeens Consent: Verbal consent obtained. Risks and benefits: risks, benefits and alternatives were discussed Type: abscess  Body area: right forearm Anesthesia: local infiltration Incision was made with a  scalpel. Local anesthetic: lidocaine Anesthetic total: 15 ml Complexity: complex Blunt dissection to break up loculations Drainage: 5 cc blood and pus   Labs Review Labs Reviewed - No data to display Imaging Review No results found.  MDM  No diagnosis found. Cellulitis with abscess, likely mrsa. Pt is well appearing otherwise.  Discussed iv abx and admission, since pt has not had I and D yet he prefers Changing abx and I and D done in ED and close fup outp.  Clindamycin given in ED. Patient tolerance: Patient tolerated the procedure well with no immediate complications.  Long discussion regarding close fup outpt, pt said he would get a recheck in 24-36 hrs and he understands that if worsening or no improvement he will need IV abx and admission with likely ortho consult for further drainage.  I was not comfortable doing a deep I and D with muscles/ nerves/ et Karie Soda, maintained subcutaneous.  NV intact afterwards.  Pt understands plan and will fup as directed.        Enid Skeens, MD 04/21/13 1714

## 2014-02-24 ENCOUNTER — Ambulatory Visit (INDEPENDENT_AMBULATORY_CARE_PROVIDER_SITE_OTHER): Payer: Self-pay | Admitting: Family Medicine

## 2014-02-24 ENCOUNTER — Encounter: Payer: Self-pay | Admitting: Family Medicine

## 2014-02-24 VITALS — BP 120/80 | HR 86 | Ht 72.0 in | Wt 194.0 lb

## 2014-02-24 DIAGNOSIS — R6882 Decreased libido: Secondary | ICD-10-CM

## 2014-02-24 NOTE — Progress Notes (Signed)
   Subjective:    Patient ID: Louis Zimmerman, male    DOB: 02/23/1961, 53 y.o.   MRN: [id number]  HPI He is here for consult concerning recent blood work as well as dealing with a one-month history of fatigue, decreased libido and stamina as well as increased sleeping. He continues on medications listed in the chart and apparently does have an underlying bipolar disorder. He did have a medication change 2 months ago. He has no complaints of fever, chills, cough, congestion, weight change.  He is in between insurance and does plan to get insurance in the near future.  Review of Systems     Objective:   Physical Exam alert and in no distress. Tympanic membranes and canals are normal. Throat is clear. Tonsils are normal. Neck is supple without adenopathy or thyromegaly. Cardiac exam shows a regular sinus rhythm without murmurs or gallops. Lungs are clear to auscultation. DTRs are normal. Recent blood work was reviewed and does show a slightly abnormal TSH.                                                                                                                      Assessment & Plan:  Libido, decreased - Plan: Testosterone

## 2014-02-25 ENCOUNTER — Telehealth: Payer: Self-pay | Admitting: Family Medicine

## 2014-02-25 LAB — TESTOSTERONE: Testosterone: 427 ng/dL (ref 300–890)

## 2014-02-25 NOTE — Telephone Encounter (Signed)
Pt called for lab results, results and JCL notes/instructions given

## 2014-03-01 ENCOUNTER — Telehealth: Payer: Self-pay

## 2014-03-01 ENCOUNTER — Encounter: Payer: Self-pay | Admitting: Family Medicine

## 2014-03-01 NOTE — Telephone Encounter (Signed)
Explained that decreased libido and erectile function are totally separate

## 2014-03-01 NOTE — Telephone Encounter (Signed)
LEFT MESSAGE OF WHAT YOUR LAST APPOINTMENT WAS FOR SOMETHING TOTALLY DIFFERENT THAN ED IF HE WANTED TO COME IN FOR APPOINTMENT TO DISCUSS ED TO PLEASE CALL

## 2014-03-01 NOTE — Telephone Encounter (Signed)
PATIENT WAS INFORMED OF LAB RESULTS AND ASKED IF YOU WOULD WRITE A RX FOR VIAGRA PLEASE ADVISE

## 2017-01-25 ENCOUNTER — Encounter: Payer: Self-pay | Admitting: Family Medicine

## 2017-01-25 ENCOUNTER — Ambulatory Visit (INDEPENDENT_AMBULATORY_CARE_PROVIDER_SITE_OTHER): Payer: Self-pay | Admitting: Family Medicine

## 2017-01-25 VITALS — BP 102/68 | HR 65 | Temp 98.1°F | Resp 16 | Ht 71.5 in | Wt 185.0 lb

## 2017-01-25 DIAGNOSIS — F413 Other mixed anxiety disorders: Secondary | ICD-10-CM

## 2017-01-25 DIAGNOSIS — F314 Bipolar disorder, current episode depressed, severe, without psychotic features: Secondary | ICD-10-CM

## 2017-01-25 DIAGNOSIS — F332 Major depressive disorder, recurrent severe without psychotic features: Secondary | ICD-10-CM

## 2017-01-25 MED ORDER — LAMOTRIGINE 100 MG PO TABS: 50.0000 mg | ORAL_TABLET | Freq: Two times a day (BID) | ORAL | 0 refills | Status: DC

## 2017-01-25 MED ORDER — GABAPENTIN 300 MG PO CAPS: ORAL_CAPSULE | ORAL | 2 refills | Status: DC

## 2017-01-25 MED ORDER — CLONAZEPAM 0.5 MG PO TABS: 0.5000 mg | ORAL_TABLET | Freq: Two times a day (BID) | ORAL | 0 refills | Status: DC | PRN

## 2017-01-25 NOTE — Patient Instructions (Addendum)
IF you received an x-ray today, you will receive an invoice from [facility]. Please contact [facility] at [phone] with questions or concerns regarding your invoice.   IF you received labwork today, you will receive an invoice from [facility]. Please contact [facility] at [phone] with questions or concerns regarding your invoice.   Our billing staff will not be able to assist you with questions regarding bills from these companies.  You will be contacted with the lab results as soon as they are available. The fastest way to get your results is to activate your My Chart account. Instructions are located on the last page of this paperwork. If you have not heard from [place] regarding the results in 2 weeks, please contact this office.     Bipolar 1 Disorder Bipolar 1 disorder is a mental health disorder in which a person has episodes of emotional highs (mania), and may also have episodes of emotional lows (depression) in addition to highs. Bipolar 1 disorder is different from other bipolar disorders because it involves extreme manic episodes. These episodes last at least one week or involve symptoms that are so severe that hospitalization is needed to keep the person safe. What increases the risk? The cause of this condition is not known. However, certain factors make you more likely to have bipolar disorder, such as:  Having a family member with the disorder.  An imbalance of certain chemicals in the brain (neurotransmitters).  Stress, such as illness, financial problems, or a death.  Certain conditions that affect the brain or spinal cord (neurologic conditions).  Brain injury (trauma).  Having another mental health disorder, such as: ? Obsessive compulsive disorder. ? Schizophrenia.  What are the signs or symptoms? Symptoms of mania include:  Very high self-esteem or self-confidence.  Decreased need for sleep.  Unusual talkativeness or feeling a need  to keep talking. Speech may be very fast. It may seem like you cannot stop talking.  Racing thoughts or constant talking, with quick shifts between topics that may or may not be related (flight of ideas).  Decreased ability to focus or concentrate.  Increased purposeful activity, such as work, studies, or social activity.  Increased nonproductive activity. This could be pacing, squirming and fidgeting, or finger and toe tapping.  Impulsive behavior and poor judgment. This may result in high-risk activities, such as having unprotected sex or spending a lot of money.  Symptoms of depression include:  Feeling sad, hopeless, or helpless.  Frequent or uncontrollable crying.  Lack of feeling or caring about anything.  Sleeping too much.  Moving more slowly than usual.  Not being able to enjoy things you used to enjoy.  Wanting to be alone all the time.  Feeling guilty or worthless.  Lack of energy or motivation.  Trouble concentrating or remembering.  Trouble making decisions.  Increased appetite.  Thoughts of death, or the desire to harm yourself.  Sometimes, you may have a mixed mood. This means having symptoms of depression and mania. Stress can make symptoms worse. How is this diagnosed? To diagnose bipolar disorder, your health care provider may ask about your:  Emotional episodes.  Medical history.  Alcohol and drug use. This includes prescription medicines. Certain medical conditions and substances can cause symptoms that seem like bipolar disorder (secondary bipolar disorder).  How is this treated? Bipolar disorder is a long-term (chronic) illness. It is best controlled with ongoing (continuous) treatment rather than treatment only when symptoms occur. Treatment may include:  Medicine.  Medicine can be prescribed by a provider who specializes in treating mental disorders (psychiatrist). ? Medicines called mood stabilizers are usually prescribed. ? If symptoms  occur even while taking a mood stabilizer, other medicines may be added.  Psychotherapy. Some forms of talk therapy, such as cognitive-behavioral therapy (CBT), can provide support, education, and guidance.  Coping methods, such as journaling or relaxation exercises. These may include: ? Yoga. ? Meditation. ? Deep breathing.  Lifestyle changes, such as: ? Limiting alcohol and drug use. ? Exercising regularly. ? Getting plenty of sleep. ? Making healthy eating choices.  A combination of medicine, talk therapy, and coping methods is best. A procedure in which electricity is applied to the brain through the scalp (electroconvulsive therapy) may be used in cases of severe mania when medicine and psychotherapy work too slowly or do not work. Follow these instructions at home: Activity   Return to your normal activities as told by your health care provider.  Find activities that you enjoy, and make time to do them.  Exercise regularly as told by your health care provider. Lifestyle  Limit alcohol intake to no more than 1 drink a day for nonpregnant women and 2 drinks a day for men. One drink equals 12 oz of beer, 5 oz of wine, or 1 oz of hard liquor.  Follow a set schedule for eating and sleeping.  Eat a balanced diet that includes fresh fruits and vegetables, whole grains, low-fat dairy, and lean meat.  Get 7-8 hours of sleep each night. General instructions  Take over-the-counter and prescription medicines only as told by your health care provider.  Think about joining a support group. Your health care provider may be able to recommend a support group.  Talk with your family and loved ones about your treatment goals and how they can help.  Keep all follow-up visits as told by your health care provider. This is important. Where to find more information: For more information about bipolar disorder, visit the following websites:  [employer] on Mental Illness:  www.nami.[place]: https://carter.com/  Contact a health care provider if:  Your symptoms get worse.  You have side effects from your medicine, and they get worse.  You have trouble sleeping.  You have trouble doing daily activities.  You feel unsafe in your surroundings.  You are dealing with substance abuse. Get help right away if:  You have new symptoms.  You have thoughts about harming yourself.  You self-harm. This information is not intended to replace advice given to you by your health care provider. Make sure you discuss any questions you have with your health care provider. Document Released: 10/21/2000 Document Revised: 03/10/2016 Document Reviewed: 03/14/2016 Elsevier Interactive Patient Education  2018 [employer].  Benzodiazepine Withdrawal Benzodiazepines are prescription medicines that decrease the activity of (depress) the central nervous system and cause changes in certain brain chemicals (neurotransmitters). Withdrawal is a group of physical and mental symptoms that can happen when you suddenly stop taking a medicine. There are many types of benzodiazepines. Some benzodiazepines take effect quickly and stay in your system for a short amount of time (short-acting). Other benzodiazepines require more time to take effect and stay in your system for longer amounts of time (long-acting). The five most commonly prescribed benzodiazepines are:  Alprazolam.  Lorazepam.  Clonazepam.  Diazepam.  Temazepam.  What are the causes? When you take benzodiazepines, your brain needs more and more of the medicine over time in  order to get the same effects from it. This increased need is called tolerance. As you develop a tolerance, your brain adapts to the effects of the benzodiazepine and relies on these effects. This is called dependency. Withdrawal happens when you suddenly stop taking your medicine. This does not give your brain enough  time to adapt to not having the medicine. What increases the risk? This condition is more likely to develop in:  People who have taken benzodiazepines for more than 1-2 weeks.  People who have developed a tolerance for benzodiazepines.  People who have developed a dependence on benzodiazepines.  People who take high dosages of benzodiazepines.  People who take doses of benzodiazepines that are higher than prescribed.  People who take benzodiazepines without a prescription.  People who use benzodiazepines with other substances that depress the central nervous system, such as alcohol.  People who have a history of drug or alcohol abuse.  What are the signs or symptoms? Symptoms of this condition may include:  Difficulty sleeping.  Anxiety.  Restlessness.  Irritability.  Muscle aches.  Involuntary shaking or trembling of a body part (tremor).  Confusion and poor concentration.  Vomiting.  Sweating.  Headaches.  Feeling or seeing things that are not there (hallucinations).  Seizures.  Symptoms of withdrawal from short-acting benzodiazepines may develop 1-2 days after you stop taking your medicine, and they may last for 2-4 weeks or longer. Symptoms of withdrawal from long-acting benzodiazepines may develop 2-7 days after you stop taking your medicine, and they may last for 2-8 weeks or longer. How is this diagnosed? This condition may be diagnosed based on:  Your symptoms.  A physical exam. Your health care provider may check for: ? Rapid heartbeat. ? Rapid breathing. ? Tremors. ? High blood pressure.  Blood tests.  Urine tests.  Your alcohol and drug habits.  Your medical history.  How is this treated? Treatment for this condition depends on:  Your symptoms.  The type of benzodiazepine you have been taking.  How long you have been taking benzodiazepines.  Treatment usually involves starting you on a safe and stable dose of a benzodiazepine and  then slowly lowering your dosage over time (tapered withdrawal). This may be done at a hospital or a treatment center. Long-term treatment for this condition may involve medicine, counseling, and support groups. Follow these instructions at home:  Take over-the-counter and prescription medicines only as told by your health care provider.  Check with your health care provider before starting new medicines.  Keep all follow-up visits as told by your health care provider. This is important. How is this prevented?  Do not take any benzodiazepines without a prescription.  Do not take more than your prescribed dosage.  Do not mix benzodiazepines with alcohol or other medicines.  Do not stop taking benzodiazepines without speaking with your health care provider. Contact a health care provider if:  You are not able to take your medicines as told by your health care provider.  You have symptoms that get worse.  You develop withdrawal symptoms during your tapered withdrawal.  You develop a craving for drugs or alcohol.  You experience withdrawal again (relapse). Get help right away if:  You have a seizure.  You become very confused.  You lose consciousness.  You have difficulty breathing.  You have serious thoughts about hurting yourself or someone else. This information is not intended to replace advice given to you by your health care provider. Make sure you discuss any  questions you have with your health care provider. Document Released: 07/04/2011 Document Revised: 11/23/2015 Document Reviewed: 01/04/2015 Elsevier Interactive Patient Education  [employer].
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
## 2017-02-12 ENCOUNTER — Encounter: Payer: Self-pay | Admitting: Family Medicine

## 2017-02-21 ENCOUNTER — Other Ambulatory Visit: Payer: Self-pay | Admitting: Family Medicine

## 2017-02-21 NOTE — Telephone Encounter (Signed)
Pharmacy is requesting refill on Lamictal Pt last seen on 01/25/17 Pt next appt is 02/24/17 Please advise/refill

## 2017-02-23 DIAGNOSIS — F1911 Other psychoactive substance abuse, in remission: Secondary | ICD-10-CM | POA: Insufficient documentation
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
## 2017-02-24 ENCOUNTER — Ambulatory Visit (INDEPENDENT_AMBULATORY_CARE_PROVIDER_SITE_OTHER): Payer: Self-pay | Admitting: Family Medicine

## 2017-02-24 ENCOUNTER — Encounter: Payer: Self-pay | Admitting: Family Medicine

## 2017-02-24 VITALS — BP 111/57 | HR 73 | Resp 18 | Ht 71.5 in | Wt 179.2 lb

## 2017-02-24 DIAGNOSIS — Z87898 Personal history of other specified conditions: Secondary | ICD-10-CM

## 2017-02-24 DIAGNOSIS — F332 Major depressive disorder, recurrent severe without psychotic features: Secondary | ICD-10-CM

## 2017-02-24 DIAGNOSIS — F314 Bipolar disorder, current episode depressed, severe, without psychotic features: Secondary | ICD-10-CM

## 2017-02-24 DIAGNOSIS — F1911 Other psychoactive substance abuse, in remission: Secondary | ICD-10-CM

## 2017-02-24 DIAGNOSIS — F413 Other mixed anxiety disorders: Secondary | ICD-10-CM

## 2017-02-24 MED ORDER — GABAPENTIN 300 MG PO CAPS: ORAL_CAPSULE | ORAL | 2 refills | Status: DC

## 2017-02-24 MED ORDER — LAMOTRIGINE 100 MG PO TABS: 100.0000 mg | ORAL_TABLET | Freq: Two times a day (BID) | ORAL | 2 refills | Status: DC

## 2017-02-24 MED ORDER — CLONAZEPAM 0.5 MG PO TABS: 0.5000 mg | ORAL_TABLET | Freq: Three times a day (TID) | ORAL | 1 refills | Status: DC | PRN

## 2017-02-24 NOTE — Patient Instructions (Addendum)
Check out online [facility] prices or check out [place] for better prescription drug prices.    IF you received an x-ray today, you will receive an invoice from [facility]. Please contact [facility] at [phone] with questions or concerns regarding your invoice.   IF you received labwork today, you will receive an invoice from [facility]. Please contact [facility] at [phone] with questions or concerns regarding your invoice.   Our billing staff will not be able to assist you with questions regarding bills from these companies.  You will be contacted with the lab results as soon as they are available. The fastest way to get your results is to activate your My Chart account. Instructions are located on the last page of this paperwork. If you have not heard from [place] regarding the results in 2 weeks, please contact this office.

## 2017-03-27 ENCOUNTER — Ambulatory Visit: Payer: Self-pay | Admitting: Family Medicine

## 2017-04-24 ENCOUNTER — Ambulatory Visit (INDEPENDENT_AMBULATORY_CARE_PROVIDER_SITE_OTHER): Payer: Self-pay | Admitting: Family Medicine

## 2017-04-24 ENCOUNTER — Encounter: Payer: Self-pay | Admitting: Family Medicine

## 2017-04-24 VITALS — BP 102/68 | HR 70 | Temp 97.7°F | Resp 16 | Ht 72.05 in | Wt 187.0 lb

## 2017-04-24 DIAGNOSIS — Z87898 Personal history of other specified conditions: Secondary | ICD-10-CM

## 2017-04-24 DIAGNOSIS — F331 Major depressive disorder, recurrent, moderate: Secondary | ICD-10-CM

## 2017-04-24 DIAGNOSIS — F314 Bipolar disorder, current episode depressed, severe, without psychotic features: Secondary | ICD-10-CM

## 2017-04-24 DIAGNOSIS — F1911 Other psychoactive substance abuse, in remission: Secondary | ICD-10-CM

## 2017-04-24 DIAGNOSIS — F413 Other mixed anxiety disorders: Secondary | ICD-10-CM

## 2017-04-24 MED ORDER — LAMOTRIGINE 25 MG PO TABS: ORAL_TABLET | ORAL | 0 refills | Status: DC

## 2017-04-24 MED ORDER — GABAPENTIN 300 MG PO CAPS: ORAL_CAPSULE | ORAL | 2 refills | Status: DC

## 2017-04-24 MED ORDER — LAMOTRIGINE 100 MG PO TABS: ORAL_TABLET | ORAL | 2 refills | Status: DC

## 2017-04-24 MED ORDER — CLONAZEPAM 0.5 MG PO TABS: 0.5000 mg | ORAL_TABLET | Freq: Three times a day (TID) | ORAL | 2 refills | Status: DC | PRN

## 2017-04-24 MED ORDER — FLUOXETINE HCL 20 MG PO TABS: 20.0000 mg | ORAL_TABLET | Freq: Every day | ORAL | 3 refills | Status: DC

## 2017-04-24 MED ORDER — MUPIROCIN 2 % EX OINT: 1.0000 "application " | TOPICAL_OINTMENT | Freq: Three times a day (TID) | CUTANEOUS | 1 refills | Status: DC
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
## 2017-04-24 NOTE — Patient Instructions (Addendum)
  Use hibiclens as a body wash for days - about a week - in the shower .   IF you received an x-ray today, you will receive an invoice from [facility]. Please contact [facility] at [phone] with questions or concerns regarding your invoice.   IF you received labwork today, you will receive an invoice from [facility]. Please contact [facility] at [phone] with questions or concerns regarding your invoice.   Our billing staff will not be able to assist you with questions regarding bills from these companies.  You will be contacted with the lab results as soon as they are available. The fastest way to get your results is to activate your My Chart account. Instructions are located on the last page of this paperwork. If you have not heard from [place] regarding the results in 2 weeks, please contact this office.

## 2017-06-02 ENCOUNTER — Ambulatory Visit (INDEPENDENT_AMBULATORY_CARE_PROVIDER_SITE_OTHER): Payer: Self-pay | Admitting: Family Medicine

## 2017-06-02 ENCOUNTER — Other Ambulatory Visit: Payer: Self-pay

## 2017-06-02 ENCOUNTER — Encounter: Payer: Self-pay | Admitting: Family Medicine

## 2017-06-02 VITALS — BP 112/64 | HR 76 | Temp 97.9°F | Resp 16 | Ht 72.05 in | Wt 184.6 lb

## 2017-06-02 DIAGNOSIS — F332 Major depressive disorder, recurrent severe without psychotic features: Secondary | ICD-10-CM

## 2017-06-02 MED ORDER — BUPROPION HCL ER (SR) 150 MG PO TB12: 150.0000 mg | ORAL_TABLET | Freq: Every day | ORAL | 1 refills | Status: DC

## 2017-06-02 NOTE — Progress Notes (Signed)
Subjective:    Patient ID: Louis Zimmerman, male    DOB: 09-27-1960, 56 y.o.   MRN: [id number] Chief Complaint  Patient presents with  . Medication Problem    forgot to get depression medication filled    HPI  Feels like his ADD is flaired so got confused. Depression has been bad. So changed appt from 12/27.  Is taking the lamictal 100 bid.  Very forgetful He was reading about bipolar and doesn't feel like he had any manic sxs - had angry period and adrenaline junction. Doesn't have mood swings though depression does run in his family. How much is his brain chemistry going to change. Hoaving to learn coping skills to ensure he is processing everything he is being told to do.  Can't read and can't focus.   His daughter who is like him has mild dresspion but driven, cool, iin [place] - more like her mom  Tobacco use: Can't read this book to help quit. Wants to. Remembers that he was able to get down to 1 cig/d on zyban, then tried it again and had side effects.  Drained mentally and physically.  Sleeping a ton, drinking a lot Monster energy drinks but still completely exhausted - can fall asleep right away. Much worse on weekends. Work helps him get up and go.  Anxiety is much better - taking 1 twice day - has the 3rd if needed at lunch but rarely needed. Gabapentin 2 at night, 1 in the morning, usually skipping one at lunch.   Past Medical History:  Diagnosis Date  . Depression    Past Surgical History:  Procedure Laterality Date  . GALLBLADDER SURGERY  2003   Current Outpatient Medications on File Prior to Visit  Medication Sig Dispense Refill  . clonazePAM (KLONOPIN) 0.5 MG tablet Take 1 tablet (0.5 mg total) by mouth 3 (three) times daily as needed for anxiety. 90 tablet 2  . FLUoxetine (PROZAC) 20 MG tablet Take 1 tablet (20 mg total) by mouth daily. 30 tablet 3  . gabapentin (NEURONTIN) 300 MG capsule Take 1 tab po bid as needed for anxiety/pain  management and take 2 tabs po qhs. 120 capsule 2  . lamoTRIgine (LAMICTAL) 100 MG tablet 1 tab po daily for 2 weeks, then 2 tabs daily 60 tablet 2  . mupirocin ointment (BACTROBAN) 2 % Apply 1 application topically 3 (three) times daily. 22 g 1   No current facility-administered medications on file prior to visit.    No Known Allergies Family History  Problem Relation Age of Onset  . Cancer Mother   . Dementia Mother   . Diabetes Mother   . Heart Problems Mother   . Liver cancer Father   . Colon cancer Father   . Alcohol abuse Maternal Grandfather   . Alcohol abuse Paternal Grandfather   . Colon polyps Sister   . Colon polyps Sister    Social History   Socioeconomic History  . Marital status: Married    Spouse name: None  . Number of children: None  . Years of education: None  . Highest education level: None  Social Needs  . Financial resource strain: None  . Food insecurity - worry: None  . Food insecurity - inability: None  . Transportation needs - medical: None  . Transportation needs - non-medical: None  Occupational History  . None  Tobacco Use  . Smoking status: Current Every Day Smoker    Packs/day: 3.00  Types: Cigarettes  . Smokeless tobacco: Never Used  Substance and Sexual Activity  . Alcohol use: No  . Drug use: No  . Sexual activity: None  Other Topics Concern  . None  Social History Narrative  . None   Depression screen [facility] 2/9 04/24/2017 02/24/2017 01/25/2017  Decreased Interest 0 1 3  Down, Depressed, Hopeless 0 1 2  PHQ - 2 Score 0 2 5  Altered sleeping - 2 3  Tired, decreased energy - 2 3  Change in appetite - 0 3  Feeling bad or failure about yourself  - 0 3  Trouble concentrating - 2 3  Moving slowly or fidgety/restless - 0 0  Suicidal thoughts - 0 1  PHQ-9 Score - 8 21  Difficult doing work/chores - - Extremely dIfficult      Review of Systems See hpi    Objective:   Physical Exam  Constitutional: He is oriented to person,  place, and time. He appears well-developed and well-nourished. No distress.  HENT:  Head: Normocephalic and atraumatic.  Eyes: No scleral icterus.  Pulmonary/Chest: Effort normal.  Neurological: He is alert and oriented to person, place, and time.  Skin: Skin is warm and dry. He is not diaphoretic.  Psychiatric: He has a normal mood and affect. His behavior is normal.    BP 112/64   Pulse 76   Temp 97.9 F (36.6 C)   Resp 16   Ht 6' 0.05" (1.83 m)   Wt 184 lb 9.6 oz (83.7 kg)   SpO2 99%   BMI 25.00 kg/m      Assessment & Plan:   1. Severe episode of recurrent major depressive disorder, without psychotic features ([place])   Unsure if bipolar hx. Start prozac. Can add in wellbutrin if needed in sev wks.  Then will eval stopping lamictal if doing well. Cont klonopin/gabapentin    Meds ordered this encounter  Medications  . buPROPion (WELLBUTRIN SR) 150 MG 12 hr tablet    Sig: Take 1 tablet (150 mg total) daily by mouth.    Dispense:  60 tablet    Refill:  1    [name], M.D.  Primary Care at [facility] [address] [place], [place] [address] [phone] phone [phone] fax  06/05/17 6:52 AM

## 2017-06-02 NOTE — Patient Instructions (Signed)
Continue the lamictal 100mg  twice a day. Start the fluoxetine 20mg  a day. If you need to, in another 2 weeks, start the wellbutrin SR 150mg  qam to help with energy, focus, smoking cessation. Recheck with me in 6-8 wks.

## 2017-06-04 ENCOUNTER — Other Ambulatory Visit: Payer: Self-pay | Admitting: Family Medicine

## 2017-06-12 ENCOUNTER — Other Ambulatory Visit: Payer: Self-pay

## 2017-06-12 MED ORDER — FLUOXETINE HCL 20 MG PO TABS: 20.0000 mg | ORAL_TABLET | Freq: Every day | ORAL | 3 refills | Status: DC

## 2017-07-24 ENCOUNTER — Ambulatory Visit: Payer: Self-pay | Admitting: Family Medicine

## 2017-07-28 ENCOUNTER — Encounter: Payer: Self-pay | Admitting: Family Medicine

## 2017-07-28 ENCOUNTER — Other Ambulatory Visit: Payer: Self-pay

## 2017-07-28 ENCOUNTER — Ambulatory Visit: Payer: Self-pay | Admitting: Family Medicine

## 2017-07-28 VITALS — BP 110/66 | HR 71 | Temp 97.8°F | Resp 16 | Ht 72.05 in | Wt 184.2 lb

## 2017-07-28 DIAGNOSIS — F1911 Other psychoactive substance abuse, in remission: Secondary | ICD-10-CM

## 2017-07-28 DIAGNOSIS — Z87898 Personal history of other specified conditions: Secondary | ICD-10-CM

## 2017-07-28 DIAGNOSIS — F332 Major depressive disorder, recurrent severe without psychotic features: Secondary | ICD-10-CM

## 2017-07-28 DIAGNOSIS — F413 Other mixed anxiety disorders: Secondary | ICD-10-CM

## 2017-07-28 MED ORDER — BUPROPION HCL ER (SR) 150 MG PO TB12: 300.0000 mg | ORAL_TABLET | Freq: Every day | ORAL | 2 refills | Status: DC

## 2017-07-28 NOTE — Patient Instructions (Addendum)
Stop the lamictal. Increase the wellbutrin to 2 tab IN THE MORNING. Continue the gabapentin and the klonopin   Make an appointment with one of our physician assistants for removal of the left arm GRANULOMA - we need to ensure that this is not a basal cell carcinoma as well - the whole thing needs to be excised.    IF you received an x-ray today, you will receive an invoice from [facility]. Please contact [facility] Radiology at [phone] with questions or concerns regarding your invoice.   IF you received labwork today, you will receive an invoice from [facility]. Please contact [facility] at [phone] with questions or concerns regarding your invoice.   Our billing staff will not be able to assist you with questions regarding bills from these companies.  You will be contacted with the lab results as soon as they are available. The fastest way to get your results is to activate your My Chart account. Instructions are located on the last page of this paperwork. If you have not heard from [place] regarding the results in 2 weeks, please contact this office.

## 2017-07-28 NOTE — Progress Notes (Signed)
Subjective:    Patient ID: Louis Zimmerman, male    DOB: 30-May-1961, 56 y.o.   MRN: [id number] Chief Complaint  Patient presents with  . Follow-up    patient presents for 3 month medication follow up, has questions regarding rx for Lamictal    HPI Felt like he was at an 8-9 with his depression and starting the prozac decreased his depression down to a 5-6 - "knocked the edge off." Sleeps 9 hrs/night. Gets up and drinks 2 cups of coffee and a monster drink, then a second monster on the way to work. Over lunch he will nap for 1-2 hrs in his truck and then again after work and then can't wait to get back to bed at night.  Feels like he is just spend mentally and physically. He never liked watching tv but that's all he does now as that's all he has the energy to do. Did not notice any + or - effects when starting the wellbutrin and did not affect his current smoking amount. Feels like he is just overwhelmed and it takes all of his energy just to focus and leaves him drained. His whole life he has been physically active and a "mover and shaker" but that has left him - his family notices a difference in his drive over the past year or so has developed these sxs.   Never formally diagnosed with ADD though his daughter has been diagnosed with it and he feels like some of his sisters have it but are in denial.   Normally when he gets involved in something he will be completely focused on it to the detriment of ignoring everything else around him.  He will have a mental meltdown he has to fight through if he gets to many things to do or has difficulty with his tasks.   Has old bottle of rexulti 2mg  and old sertraline 100mg  (from Dr. Casimiro Needle).   He has only been taking lamictal 50mg  at night only.   Past Medical History:  Diagnosis Date  . Depression    Past Surgical History:  Procedure Laterality Date  . GALLBLADDER SURGERY  2003   Current Outpatient Medications on File Prior to Visit    Medication Sig Dispense Refill  . buPROPion (WELLBUTRIN SR) 150 MG 12 hr tablet Take 1 tablet (150 mg total) daily by mouth. 60 tablet 1  . clonazePAM (KLONOPIN) 0.5 MG tablet Take 1 tablet (0.5 mg total) by mouth 3 (three) times daily as needed for anxiety. 90 tablet 2  . FLUoxetine (PROZAC) 20 MG tablet Take 1 tablet (20 mg total) daily by mouth. 30 tablet 3  . gabapentin (NEURONTIN) 300 MG capsule TAKE 1 CAPSULE TWICE A DAY AS NEEDED FOR ANXIETY/PAIN MANAGEMENT AND 2 CAPSULES AT BEDTIME 120 capsule 1  . lamoTRIgine (LAMICTAL) 100 MG tablet 1 tab po daily for 2 weeks, then 2 tabs daily 60 tablet 2  . mupirocin ointment (BACTROBAN) 2 % Apply 1 application topically 3 (three) times daily. (Patient not taking: Reported on 07/28/2017) 22 g 1   No current facility-administered medications on file prior to visit.    No Known Allergies Family History  Problem Relation Age of Onset  . Cancer Mother   . Dementia Mother   . Diabetes Mother   . Heart Problems Mother   . Liver cancer Father   . Colon cancer Father   . Alcohol abuse Maternal Grandfather   . Alcohol abuse Paternal Grandfather   . Colon  polyps Sister   . Colon polyps Sister    Social History   Socioeconomic History  . Marital status: Married    Spouse name: None  . Number of children: None  . Years of education: None  . Highest education level: None  Social Needs  . Financial resource strain: None  . Food insecurity - worry: None  . Food insecurity - inability: None  . Transportation needs - medical: None  . Transportation needs - non-medical: None  Occupational History  . None  Tobacco Use  . Smoking status: Current Every Day Smoker    Packs/day: 3.00    Types: Cigarettes  . Smokeless tobacco: Never Used  Substance and Sexual Activity  . Alcohol use: No  . Drug use: No  . Sexual activity: None  Other Topics Concern  . None  Social History Narrative  . None   Depression screen [facility] 2/9 07/28/2017 04/24/2017  02/24/2017 01/25/2017  Decreased Interest 0 0 1 3  Down, Depressed, Hopeless 0 0 1 2  PHQ - 2 Score 0 0 2 5  Altered sleeping - - 2 3  Tired, decreased energy - - 2 3  Change in appetite - - 0 3  Feeling bad or failure about yourself  - - 0 3  Trouble concentrating - - 2 3  Moving slowly or fidgety/restless - - 0 0  Suicidal thoughts - - 0 1  PHQ-9 Score - - 8 21  Difficult doing work/chores - - - Extremely dIfficult    Review of Systems See hpi    Objective:   Physical Exam  Constitutional: He is oriented to person, place, and time. He appears well-developed and well-nourished. No distress.  HENT:  Head: Normocephalic and atraumatic.  Eyes: Conjunctivae are normal. Pupils are equal, round, and reactive to light. No scleral icterus.  Neck: Normal range of motion. Neck supple. No thyromegaly present.  Cardiovascular: Normal rate, regular rhythm, normal heart sounds and intact distal pulses.  Pulmonary/Chest: Effort normal and breath sounds normal. No respiratory distress.  Musculoskeletal: He exhibits no edema.  Lymphadenopathy:    He has no cervical adenopathy.  Neurological: He is alert and oriented to person, place, and time.  Skin: Skin is warm and dry. He is not diaphoretic.  Psychiatric: He has a normal mood and affect. His behavior is normal.      BP 110/66 (BP Location: Left Arm, Patient Position: Sitting, Cuff Size: Normal)   Pulse 71   Temp 97.8 F (36.6 C) (Oral)   Resp 16   Ht 6' 0.05" (1.83 m)   Wt 184 lb 3.2 oz (83.6 kg)   SpO2 99%   BMI 24.95 kg/m      Assessment & Plan:   1. Severe episode of recurrent major depressive disorder, without psychotic features ([place])   2. Other mixed anxiety disorders   3. History of substance abuse      Meds ordered this encounter  Medications  . buPROPion (WELLBUTRIN SR) 150 MG 12 hr tablet    Sig: Take 2 tablets (300 mg total) by mouth daily.    Dispense:  60 tablet    Refill:  2  . gabapentin (NEURONTIN) 300  MG capsule    Sig: TAKE 1 CAPSULE TWICE A DAY AS NEEDED FOR ANXIETY/PAIN MANAGEMENT AND 2 CAPSULES AT BEDTIME    Dispense:  120 capsule    Refill:  1  . clonazePAM (KLONOPIN) 0.5 MG tablet    Sig: Take 1 tablet (0.5 mg  total) by mouth 3 (three) times daily as needed for anxiety.    Dispense:  90 tablet    Refill:  2    [name], M.D.  Primary Care at [facility] [address] [place], [place] [address] [phone] phone [phone] fax  07/31/17 8:03 AM

## 2017-07-31 MED ORDER — CLONAZEPAM 0.5 MG PO TABS: 0.5000 mg | ORAL_TABLET | Freq: Three times a day (TID) | ORAL | 2 refills | Status: DC | PRN

## 2017-07-31 MED ORDER — GABAPENTIN 300 MG PO CAPS: ORAL_CAPSULE | ORAL | 1 refills | Status: DC

## 2017-08-08 ENCOUNTER — Ambulatory Visit (INDEPENDENT_AMBULATORY_CARE_PROVIDER_SITE_OTHER): Payer: Self-pay | Admitting: Physician Assistant

## 2017-08-08 ENCOUNTER — Encounter: Payer: Self-pay | Admitting: Physician Assistant

## 2017-08-08 VITALS — BP 118/70 | HR 75 | Temp 97.7°F | Resp 17 | Ht 72.5 in | Wt 188.0 lb

## 2017-08-08 DIAGNOSIS — D229 Melanocytic nevi, unspecified: Secondary | ICD-10-CM

## 2017-08-08 NOTE — Progress Notes (Signed)
    08/08/2017 8:50 AM   DOB: May 29, 1961 / MRN: [id number]  SUBJECTIVE:  Louis Zimmerman is a 57 y.o. male presenting for excision of a lesion about the left arm at the lateral [facility] joint. Was evaluated by Dr. Brigitte Pulse about 1 or so weeks ago and he was advised to come back to have this removed.  Tells me that there was a small scab in area of concern and he picked this away and the lesion began to grow.  There has been no exudate.   He has No Known Allergies.   He  has a past medical history of Depression.    He  reports that he has been smoking cigarettes.  He has been smoking about 3.00 packs per day. he has never used smokeless tobacco. He reports that he does not drink alcohol or use drugs. He  has no sexual activity history on file. The patient  has a past surgical history that includes Gallbladder surgery (2003).  His family history includes Alcohol abuse in his maternal grandfather and paternal grandfather; Cancer in his mother; Colon cancer in his father; Colon polyps in his sister and sister; Dementia in his mother; Diabetes in his mother; Heart Problems in his mother; Liver cancer in his father.  Review of Systems  Constitutional: Negative for weight loss.  Gastrointestinal: Negative for nausea.  Skin: Positive for rash. Negative for itching.    The problem list and medications were reviewed and updated by myself where necessary and exist elsewhere in the encounter.   OBJECTIVE:  BP 118/70   Pulse 75   Temp 97.7 F (36.5 C) (Oral)   Resp 17   Ht 6' 0.5" (1.842 m)   Wt 188 lb (85.3 kg)   SpO2 98%   BMI 25.15 kg/m   Physical Exam  Constitutional: He appears well-developed. He is active and cooperative.  Non-toxic appearance.  Cardiovascular: Normal rate.  Pulmonary/Chest: Effort normal. No tachypnea.  Neurological: He is alert.  Skin: Skin is warm and dry. He is not diaphoretic. No pallor.     Vitals reviewed.   Procedure: Risk and benefits discussed and verbal  consent obtained. Betadine prep times three. Sterile drape placed. Patient anesthetized with 2% lidocaine with epi. Lesion of concern excised in an elliptical fashion. Wound repaired in a HM fashion.  Wound well approximated and hemostasis achieved. Mupirocin dressing placed.   ASSESSMENT AND PLAN:  [name] was seen today for cyst removal.  Diagnoses and all orders for this visit:  Suspicious nevus: Most like basal cell carcinoma.  Lesion removed with generous margins.   -     Dermatology pathology    The patient is advised to call or return to clinic if he does not see an improvement in symptoms, or to seek the care of the closest emergency department if he worsens with the above plan.   Philis Fendt, MHS, PA-C Primary Care at [facility] 08/08/2017 8:50 AM

## 2017-08-08 NOTE — Patient Instructions (Addendum)
  Take 1000 mg of tylenol every 8 hours.   WOUND CARE Please return in 10 days to have your stitches/staples evaluated/removed or sooner if you have concerns. Marland Kitchen Keep area clean and dry for 24 hours. Do not remove bandage, if applied. . After 24 hours, remove bandage and wash wound gently with mild soap and warm water. Reapply a new bandage after cleaning wound, if directed. . Continue daily cleansing with soap and water until stitches/staples are removed. . Do not apply any ointments or creams to the wound while stitches/staples are in place, as this may cause delayed healing. . Notify the office if you experience any of the following signs of infection: Swelling, redness, pus drainage, streaking, fever >101.0 F . Notify the office if you experience excessive bleeding that does not stop after 15-20 minutes of constant, firm pressure.     IF you received an x-ray today, you will receive an invoice from [facility]. Please contact [facility] at [phone] with questions or concerns regarding your invoice.   IF you received labwork today, you will receive an invoice from [facility]. Please contact [facility] at [phone] with questions or concerns regarding your invoice.   Our billing staff will not be able to assist you with questions regarding bills from these companies.  You will be contacted with the lab results as soon as they are available. The fastest way to get your results is to activate your My Chart account. Instructions are located on the last page of this paperwork. If you have not heard from [place] regarding the results in 2 weeks, please contact this office.

## 2017-08-18 ENCOUNTER — Other Ambulatory Visit: Payer: Self-pay

## 2017-08-18 ENCOUNTER — Ambulatory Visit (INDEPENDENT_AMBULATORY_CARE_PROVIDER_SITE_OTHER): Payer: Self-pay | Admitting: Physician Assistant

## 2017-08-18 ENCOUNTER — Encounter: Payer: Self-pay | Admitting: Physician Assistant

## 2017-08-18 VITALS — BP 124/78 | HR 68 | Temp 97.7°F | Resp 16 | Ht 72.5 in | Wt 183.2 lb

## 2017-08-18 DIAGNOSIS — D229 Melanocytic nevi, unspecified: Secondary | ICD-10-CM

## 2017-08-18 NOTE — Patient Instructions (Signed)
     IF you received an x-ray today, you will receive an invoice from [facility]. Please contact [facility] at [phone] with questions or concerns regarding your invoice.   IF you received labwork today, you will receive an invoice from [facility]. Please contact [facility] at [phone] with questions or concerns regarding your invoice.   Our billing staff will not be able to assist you with questions regarding bills from these companies.  You will be contacted with the lab results as soon as they are available. The fastest way to get your results is to activate your My Chart account. Instructions are located on the last page of this paperwork. If you have not heard from us regarding the results in 2 weeks, please contact this office.     

## 2017-08-18 NOTE — Progress Notes (Signed)
Louis Zimmerman is a 57 year old male here today for recheck of left lateral antecubital wound repair, status post elliptical excision.  This was about a week ago.  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  The wound is nontender, and without exudate, and largely approximated.  Sutures removed.  Steri-Strips applied to the lateral aspect of the wound given this area seems to be under more tension.  He will come back on Friday for hopefully a final evaluation.

## 2017-08-29 ENCOUNTER — Encounter: Payer: Self-pay | Admitting: Family Medicine

## 2017-08-29 ENCOUNTER — Ambulatory Visit: Payer: Self-pay | Admitting: Physician Assistant

## 2017-08-29 ENCOUNTER — Ambulatory Visit (INDEPENDENT_AMBULATORY_CARE_PROVIDER_SITE_OTHER): Payer: Self-pay | Admitting: Family Medicine

## 2017-08-29 VITALS — BP 100/62 | HR 69 | Temp 97.9°F | Resp 16 | Ht 72.5 in | Wt 182.2 lb

## 2017-08-29 DIAGNOSIS — F1911 Other psychoactive substance abuse, in remission: Secondary | ICD-10-CM

## 2017-08-29 DIAGNOSIS — F413 Other mixed anxiety disorders: Secondary | ICD-10-CM

## 2017-08-29 DIAGNOSIS — Z87898 Personal history of other specified conditions: Secondary | ICD-10-CM

## 2017-08-29 DIAGNOSIS — F17200 Nicotine dependence, unspecified, uncomplicated: Secondary | ICD-10-CM

## 2017-08-29 DIAGNOSIS — F331 Major depressive disorder, recurrent, moderate: Secondary | ICD-10-CM

## 2017-08-29 DIAGNOSIS — R41840 Attention and concentration deficit: Secondary | ICD-10-CM

## 2017-08-29 MED ORDER — AMPHETAMINE-DEXTROAMPHETAMINE 10 MG PO TABS
10.0000 mg | ORAL_TABLET | Freq: Two times a day (BID) | ORAL | 0 refills | Status: DC
Start: 2017-08-29 — End: 2017-09-22

## 2017-08-29 MED ORDER — FLUOXETINE HCL 20 MG PO TABS: 30.0000 mg | ORAL_TABLET | Freq: Every day | ORAL | 0 refills | Status: DC

## 2017-08-29 NOTE — Progress Notes (Signed)
Subjective:    Patient ID: Louis Zimmerman, male    DOB: Mar 29, 1961, 57 y.o.   MRN: [id number] Chief Complaint  Patient presents with  . med check    "my biggest problem is focusing"    HPI Last seen 1 mo ago when we increased his bupropion to 300mg  qd and he did not notice any benefit in his concentration.  He cant do step work, can't process questions. Has to pause to answer people's questions.  Can't even do his normal work tasks that he knows that he knows how to do. Doesn't know why it is happening now.  Then the anxiety kicks in. His only other anxiety is social or crowds.  So wonders if needs to go up on the klonopin - doesn't always take it at lunch but takes an extra if he is going somewhere, doing bills.  His anxiety gets very high at night sometimes so he will take the extra that he doesn't takes at lunch.  No full blown panic attacks.  He does want to try an add medication.  He is not having the depression at the levels he was having prior we he was getting to the hopeless point.  He doesn't have to think how to brush his teeth - he can do something - like going to the bathroom - without his subconcious sabotaging.   He had been taking the bupropion 2 tabs at night - due to confusion.   Tobacco abuse hadn't changed.   Past Medical History:  Diagnosis Date  . Depression    Past Surgical History:  Procedure Laterality Date  . GALLBLADDER SURGERY  2003   Current Outpatient Medications on File Prior to Visit  Medication Sig Dispense Refill  . clonazePAM (KLONOPIN) 0.5 MG tablet Take 1 tablet (0.5 mg total) by mouth 3 (three) times daily as needed for anxiety. 90 tablet 2  . gabapentin (NEURONTIN) 300 MG capsule TAKE 1 CAPSULE TWICE A DAY AS NEEDED FOR ANXIETY/PAIN MANAGEMENT AND 2 CAPSULES AT BEDTIME 120 capsule 1   No current facility-administered medications on file prior to visit.    No Known Allergies Family History  Problem Relation Age of Onset  . Cancer Mother     . Dementia Mother   . Diabetes Mother   . Heart Problems Mother   . Liver cancer Father   . Colon cancer Father   . Alcohol abuse Maternal Grandfather   . Alcohol abuse Paternal Grandfather   . Colon polyps Sister   . Colon polyps Sister    Social History   Socioeconomic History  . Marital status: Married    Spouse name: None  . Number of children: None  . Years of education: None  . Highest education level: None  Social Needs  . Financial resource strain: None  . Food insecurity - worry: None  . Food insecurity - inability: None  . Transportation needs - medical: None  . Transportation needs - non-medical: None  Occupational History  . None  Tobacco Use  . Smoking status: Current Every Day Smoker    Packs/day: 3.00    Types: Cigarettes  . Smokeless tobacco: Never Used  Substance and Sexual Activity  . Alcohol use: No  . Drug use: No  . Sexual activity: None  Other Topics Concern  . None  Social History Narrative  . None   Depression screen [facility] 2/9 08/29/2017 08/18/2017 08/08/2017 07/28/2017 04/24/2017  Decreased Interest 1 0 0 0 0  Down, Depressed,  Hopeless 0 0 0 0 0  PHQ - 2 Score 1 0 0 0 0  Altered sleeping - - - - -  Tired, decreased energy - - - - -  Change in appetite - - - - -  Feeling bad or failure about yourself  - - - - -  Trouble concentrating - - - - -  Moving slowly or fidgety/restless - - - - -  Suicidal thoughts - - - - -  PHQ-9 Score - - - - -  Difficult doing work/chores - - - - -    Review of Systems See hpi    Objective:   Physical Exam  Constitutional: He is oriented to person, place, and time. He appears well-developed and well-nourished. No distress.  HENT:  Head: Normocephalic and atraumatic.  Eyes: No scleral icterus.  Pulmonary/Chest: Effort normal.  Neurological: He is alert and oriented to person, place, and time.  Skin: Skin is warm and dry. He is not diaphoretic.  Psychiatric: He has a normal mood and affect. His behavior  is normal.         BP 100/62   Pulse 69   Temp 97.9 F (36.6 C) (Oral)   Resp 16   Ht 6' 0.5" (1.842 m)   Wt 182 lb 3.2 oz (82.6 kg)   SpO2 99%   BMI 24.37 kg/m   Assessment & Plan:   1. Moderate episode of recurrent major depressive disorder ([place])   2. Other mixed anxiety disorders   3. History of substance abuse   4. Disturbed concentration    Increase prozac fro 20 to 30.  Reviewed high risk for abuse/addiction with stimulant therapy, reviewed the potential secondary effects and tolerance which we will not expect to see if not using for the secondary effects. Pt still very active in NA and working steps daily. Reviewed that if he does respond to stimulant therapy, we will want to decrease his klonopin/gabapentin to at most at night only use.  If adult ADD - poss from the damage he has done his brain w/ prior drug use - is a sig cause of his sxs, then likely treating the concentration deficit will improve his anxiety  - particulary the social anxiety. If the stimulants worsen his anxiety, then we need to stop them asap. Had been taking wellbutrin SR 2 tabs at night which explains worse sleep - advised that he has not "failed" this medication as he has not had a therapeutic dose in his system during day - however, not interesting trying further at this time so d/c.  Recheck in 1 mo - no controlled med refills w/o OV.  Meds ordered this encounter  Medications  . FLUoxetine (PROZAC) 20 MG tablet    Sig: Take 1.5 tablets (30 mg total) by mouth daily.    Dispense:  135 tablet    Refill:  0  . amphetamine-dextroamphetamine (ADDERALL) 10 MG tablet    Sig: Take 1 tablet (10 mg total) by mouth 2 (two) times daily with a meal.    Dispense:  60 tablet    Refill:  0      [name], M.D.  Primary Care at [facility] [address] [place], [place] [address] [phone] phone [phone] fax  08/31/17 5:56 AM

## 2017-08-29 NOTE — Patient Instructions (Signed)
     IF you received an x-ray today, you will receive an invoice from [facility]. Please contact [facility] at [phone] with questions or concerns regarding your invoice.   IF you received labwork today, you will receive an invoice from [facility]. Please contact [facility] at [phone] with questions or concerns regarding your invoice.   Our billing staff will not be able to assist you with questions regarding bills from these companies.  You will be contacted with the lab results as soon as they are available. The fastest way to get your results is to activate your My Chart account. Instructions are located on the last page of this paperwork. If you have not heard from us regarding the results in 2 weeks, please contact this office.     

## 2017-08-29 NOTE — Progress Notes (Signed)
Patient is status post elliptical excision of a cancerous lesion about the left lateral AC.  The wound has healed.  Pathology showed squamous cell carcinoma and margins are free.  Despite the area being under high tension it does not appear that the wound will dehisce.  Advised he continue placing the mupirocin on the wound mostly for moisture.  He will come back if he has any problems with the wound.  Philis Fendt PA-C

## 2017-08-31 DIAGNOSIS — Z720 Tobacco use: Secondary | ICD-10-CM | POA: Insufficient documentation

## 2017-08-31 DIAGNOSIS — F413 Other mixed anxiety disorders: Secondary | ICD-10-CM | POA: Insufficient documentation

## 2017-08-31 DIAGNOSIS — F17200 Nicotine dependence, unspecified, uncomplicated: Secondary | ICD-10-CM | POA: Insufficient documentation

## 2017-08-31 DIAGNOSIS — F331 Major depressive disorder, recurrent, moderate: Secondary | ICD-10-CM | POA: Insufficient documentation

## 2017-08-31 DIAGNOSIS — R41840 Attention and concentration deficit: Secondary | ICD-10-CM | POA: Insufficient documentation

## 2017-09-13 ENCOUNTER — Encounter: Payer: Self-pay | Admitting: Family Medicine

## 2017-09-13 ENCOUNTER — Ambulatory Visit (INDEPENDENT_AMBULATORY_CARE_PROVIDER_SITE_OTHER): Payer: Self-pay

## 2017-09-13 ENCOUNTER — Other Ambulatory Visit: Payer: Self-pay

## 2017-09-13 ENCOUNTER — Ambulatory Visit (INDEPENDENT_AMBULATORY_CARE_PROVIDER_SITE_OTHER): Payer: Self-pay | Admitting: Family Medicine

## 2017-09-13 VITALS — BP 110/60 | HR 96 | Temp 98.4°F | Resp 17 | Ht 72.05 in | Wt 182.6 lb

## 2017-09-13 DIAGNOSIS — F17200 Nicotine dependence, unspecified, uncomplicated: Secondary | ICD-10-CM

## 2017-09-13 DIAGNOSIS — R05 Cough: Secondary | ICD-10-CM

## 2017-09-13 DIAGNOSIS — R059 Cough, unspecified: Secondary | ICD-10-CM

## 2017-09-13 DIAGNOSIS — J209 Acute bronchitis, unspecified: Secondary | ICD-10-CM

## 2017-09-13 MED ORDER — FLUTICASONE PROPIONATE 50 MCG/ACT NA SUSP: 2.0000 | Freq: Every day | NASAL | 6 refills | Status: DC

## 2017-09-13 MED ORDER — AZITHROMYCIN 250 MG PO TABS
ORAL_TABLET | ORAL | 0 refills | Status: DC
Start: 2017-09-13 — End: 2017-11-10

## 2017-09-13 MED ORDER — ALBUTEROL SULFATE HFA 108 (90 BASE) MCG/ACT IN AERS: 2.0000 | INHALATION_SPRAY | Freq: Four times a day (QID) | RESPIRATORY_TRACT | 0 refills | Status: DC | PRN

## 2017-09-13 NOTE — Progress Notes (Signed)
Chief Complaint  Patient presents with  . chest congestion    x 1 week, mild chills intermittently, no fevers noticed and lots of fatigue.  Taking extra strengh mucinex with no relief     HPI   Cough  Pt started coughing a week go  This is a new episode for him  He is a smoker Reports a week of mild chills, no fevers Reports Congestion  And a Cough that is non-productive Taking mucinex 1200mg  without improvement  Taking ibuprofen and otc sudafed   Tobacco use He has been smoking 35 years He has a 2 pack a day use He reports that he has been smoking during this episode      Past Medical History:  Diagnosis Date  . Depression     Current Outpatient Medications  Medication Sig Dispense Refill  . amphetamine-dextroamphetamine (ADDERALL) 10 MG tablet Take 1 tablet (10 mg total) by mouth 2 (two) times daily with a meal. 60 tablet 0  . clonazePAM (KLONOPIN) 0.5 MG tablet Take 1 tablet (0.5 mg total) by mouth 3 (three) times daily as needed for anxiety. 90 tablet 2  . FLUoxetine (PROZAC) 20 MG tablet Take 1.5 tablets (30 mg total) by mouth daily. 135 tablet 0  . gabapentin (NEURONTIN) 300 MG capsule TAKE 1 CAPSULE TWICE A DAY AS NEEDED FOR ANXIETY/PAIN MANAGEMENT AND 2 CAPSULES AT BEDTIME 120 capsule 1  . albuterol (PROVENTIL HFA;VENTOLIN HFA) 108 (90 Base) MCG/ACT inhaler Inhale 2 puffs into the lungs every 6 (six) hours as needed for wheezing or shortness of breath. 1 Inhaler 0  . azithromycin (ZITHROMAX) 250 MG tablet Take 2 tabs on day one then one tab each day after 6 tablet 0  . fluticasone (FLONASE) 50 MCG/ACT nasal spray Place 2 sprays into both nostrils daily. 16 g 6   No current facility-administered medications for this visit.     Allergies: No Known Allergies  Past Surgical History:  Procedure Laterality Date  . GALLBLADDER SURGERY  2003    Social History   Socioeconomic History  . Marital status: Married    Spouse name: None  . Number of children: None    . Years of education: None  . Highest education level: None  Social Needs  . Financial resource strain: None  . Food insecurity - worry: None  . Food insecurity - inability: None  . Transportation needs - medical: None  . Transportation needs - non-medical: None  Occupational History  . None  Tobacco Use  . Smoking status: Current Every Day Smoker    Packs/day: 3.00    Types: Cigarettes  . Smokeless tobacco: Never Used  Substance and Sexual Activity  . Alcohol use: No  . Drug use: No  . Sexual activity: None  Other Topics Concern  . None  Social History Narrative  . None    Family History  Problem Relation Age of Onset  . Cancer Mother   . Dementia Mother   . Diabetes Mother   . Heart Problems Mother   . Liver cancer Father   . Colon cancer Father   . Alcohol abuse Maternal Grandfather   . Alcohol abuse Paternal Grandfather   . Colon polyps Sister   . Colon polyps Sister      ROS Review of Systems See HPI Constitution: No fevers or chills No malaise No diaphoresis Skin: No rash or itching Eyes: no blurry vision, no double vision GU: no dysuria or hematuria Neuro: no dizziness or headaches  all  others reviewed and negative   Objective: Vitals:   09/13/17 1108  BP: 138/70  Pulse: 96  Resp: 17  Temp: 98.4 F (36.9 C)  TempSrc: Oral  SpO2: 96%  Weight: 182 lb 9.6 oz (82.8 kg)  Height: 6' 0.05" (1.83 m)  Body mass index is 24.73 kg/m.   Physical Exam  Constitutional: He is oriented to person, place, and time. He appears well-developed and well-nourished.  HENT:  Head: Normocephalic and atraumatic.  Eyes: Conjunctivae and EOM are normal.  Neck: Normal range of motion.  Cardiovascular: Normal rate, regular rhythm and normal heart sounds.  No murmur heard. Pulmonary/Chest: Effort normal and breath sounds normal. No stridor. No respiratory distress.  Neurological: He is alert and oriented to person, place, and time.  Skin: Skin is warm. Capillary  refill takes less than 2 seconds.  Psychiatric: He has a normal mood and affect. His behavior is normal. Judgment and thought content normal.    CHEST  2 VIEW  COMPARISON:  None.  FINDINGS: Lungs are adequately inflated without focal consolidation or effusion. Cardiomediastinal silhouette is within normal. There are minimal degenerative changes of the spine.  IMPRESSION: No active cardiopulmonary disease.   Electronically Signed   By: Marin Olp M.D.   On: 09/13/2017 11:34    Assessment and Plan Lori was seen today for chest congestion.  Diagnoses and all orders for this visit:  Cough- discussed differential diagnosis  cardiopulm exam stable  Normal oxygen sat Mostly likely acute bronchitis No evidence of heart failure or foreign body -     DG Chest 2 View; Future  Acute bronchitis, unspecified organism- will treat with zpak, albuterol and flonase Smoking cessation advised  Tobacco use disorder Smoking cessation advised Will treat for acute bronchitis   Other orders -     albuterol (PROVENTIL HFA;VENTOLIN HFA) 108 (90 Base) MCG/ACT inhaler; Inhale 2 puffs into the lungs every 6 (six) hours as needed for wheezing or shortness of breath. -     azithromycin (ZITHROMAX) 250 MG tablet; Take 2 tabs on day one then one tab each day after -     fluticasone (FLONASE) 50 MCG/ACT nasal spray; Place 2 sprays into both nostrils daily.     [place]

## 2017-09-13 NOTE — Patient Instructions (Addendum)
     IF you received an x-ray today, you will receive an invoice from [facility]. Please contact [facility] at [phone] with questions or concerns regarding your invoice.   IF you received labwork today, you will receive an invoice from [facility]. Please contact [facility] at [phone] with questions or concerns regarding your invoice.   Our billing staff will not be able to assist you with questions regarding bills from these companies.  You will be contacted with the lab results as soon as they are available. The fastest way to get your results is to activate your My Chart account. Instructions are located on the last page of this paperwork. If you have not heard from us regarding the results in 2 weeks, please contact this office.     Acute Bronchitis, Adult Acute bronchitis is when air tubes (bronchi) in the lungs suddenly get swollen. The condition can make it hard to breathe. It can also cause these symptoms:  A cough.  Coughing up clear, yellow, or green mucus.  Wheezing.  Chest congestion.  Shortness of breath.  A fever.  Body aches.  Chills.  A sore throat.  Follow these instructions at home: Medicines  Take over-the-counter and prescription medicines only as told by your doctor.  If you were prescribed an antibiotic medicine, take it as told by your doctor. Do not stop taking the antibiotic even if you start to feel better. General instructions  Rest.  Drink enough fluids to keep your pee (urine) clear or pale yellow.  Avoid smoking and secondhand smoke. If you smoke and you need help quitting, ask your doctor. Quitting will help your lungs heal faster.  Use an inhaler, cool mist vaporizer, or humidifier as told by your doctor.  Keep all follow-up visits as told by your doctor. This is important. How is this prevented? To lower your risk of getting this condition again:  Wash your hands often with soap and water. If you cannot  use soap and water, use hand sanitizer.  Avoid contact with people who have cold symptoms.  Try not to touch your hands to your mouth, nose, or eyes.  Make sure to get the flu shot every year.  Contact a doctor if:  Your symptoms do not get better in 2 weeks. Get help right away if:  You cough up blood.  You have chest pain.  You have very bad shortness of breath.  You become dehydrated.  You faint (pass out) or keep feeling like you are going to pass out.  You keep throwing up (vomiting).  You have a very bad headache.  Your fever or chills gets worse. This information is not intended to replace advice given to you by your health care provider. Make sure you discuss any questions you have with your health care provider. Document Released: 01/01/2008 Document Revised: 02/21/2016 Document Reviewed: 01/03/2016 Elsevier Interactive Patient Education  2018 Elsevier Inc.  

## 2017-09-22 ENCOUNTER — Other Ambulatory Visit: Payer: Self-pay | Admitting: Family Medicine

## 2017-09-23 MED ORDER — AMPHETAMINE-DEXTROAMPHETAMINE 10 MG PO TABS: 10.0000 mg | ORAL_TABLET | Freq: Two times a day (BID) | ORAL | 0 refills | Status: DC

## 2017-10-02 ENCOUNTER — Ambulatory Visit: Payer: Self-pay | Admitting: Family Medicine

## 2017-10-27 ENCOUNTER — Encounter: Payer: Self-pay | Admitting: Family Medicine

## 2017-11-10 ENCOUNTER — Ambulatory Visit (INDEPENDENT_AMBULATORY_CARE_PROVIDER_SITE_OTHER): Payer: Self-pay | Admitting: Family Medicine

## 2017-11-10 ENCOUNTER — Other Ambulatory Visit: Payer: Self-pay

## 2017-11-10 ENCOUNTER — Encounter: Payer: Self-pay | Admitting: Family Medicine

## 2017-11-10 VITALS — BP 118/70 | HR 78 | Temp 98.1°F | Resp 18 | Ht 72.5 in | Wt 176.4 lb

## 2017-11-10 DIAGNOSIS — F1911 Other psychoactive substance abuse, in remission: Secondary | ICD-10-CM

## 2017-11-10 DIAGNOSIS — H6501 Acute serous otitis media, right ear: Secondary | ICD-10-CM

## 2017-11-10 DIAGNOSIS — Z87898 Personal history of other specified conditions: Secondary | ICD-10-CM

## 2017-11-10 DIAGNOSIS — R0981 Nasal congestion: Secondary | ICD-10-CM

## 2017-11-10 DIAGNOSIS — F413 Other mixed anxiety disorders: Secondary | ICD-10-CM

## 2017-11-10 DIAGNOSIS — F332 Major depressive disorder, recurrent severe without psychotic features: Secondary | ICD-10-CM

## 2017-11-10 DIAGNOSIS — F988 Other specified behavioral and emotional disorders with onset usually occurring in childhood and adolescence: Secondary | ICD-10-CM

## 2017-11-10 MED ORDER — CETIRIZINE-PSEUDOEPHEDRINE ER 5-120 MG PO TB12: 1.0000 | ORAL_TABLET | Freq: Two times a day (BID) | ORAL | 1 refills | Status: DC

## 2017-11-10 MED ORDER — DOXYCYCLINE HYCLATE 100 MG PO TABS: 100.0000 mg | ORAL_TABLET | Freq: Two times a day (BID) | ORAL | 0 refills | Status: DC

## 2017-11-10 MED ORDER — AMPHETAMINE-DEXTROAMPHETAMINE 15 MG PO TABS: 15.0000 mg | ORAL_TABLET | Freq: Every day | ORAL | 0 refills | Status: DC

## 2017-11-10 MED ORDER — GABAPENTIN 300 MG PO CAPS: ORAL_CAPSULE | ORAL | 1 refills | Status: DC

## 2017-11-10 MED ORDER — TRIAMCINOLONE ACETONIDE 55 MCG/ACT NA AERO: 2.0000 | INHALATION_SPRAY | Freq: Every day | NASAL | 12 refills | Status: DC

## 2017-11-10 NOTE — Progress Notes (Signed)
By signing my name below, I, Schuyler Bain, attest that this documentation has been prepared under the direction and in the presence of Dr. Laurey Arrow. Brigitte Pulse. Electronically Signed: Baldwin [place], Medical Scribe 11/10/2017 at 5:35 PM.  Subjective:    Patient ID: Louis Zimmerman, male    DOB: Aug 02, 1960, 57 y.o.   MRN: [id number] Chief Complaint  Patient presents with  . Medication Refill    Pt would like to discuss Adderall, Klonopin, and Gabapentin.  . Congestion    Pt states he has fluid on his ears and has a cough. Pt would like antibotic.    HPI Louis Zimmerman is a 57 y.o. male who presents to Primary Care at [facility] regarding medication refill and congestion. The pt last saw Dr Nolon Rod two months ago and was diagnosed with bronchitis and treated with Z pack, flonase nasal spray and Albuterol inhaler. CXR showed no abnormality. No labs or EKG needed. I last saw pt 2.5 months prior. He had been complaining for some time of difficulty concentrating and focusing as well as social anxiety and anhedonia. He did not respond to Bupropion but he was taking IR dose of 300 at night so we increased his Prozac 20 to 30 and started pt on Adderall IR BID. I encouraged pt to ween down on Klonopin and Gabapentin as hopefully anxiety will decrease with topical.   Anxiety:  He notes that the Adderall decreased his feelings of being overwhelmed and anxiety until around 3-4pm if he didn't take an afternoon dose. He notes that it still takes effort for him to focus. He notes that he has not need to refill his Klonopin. He has been out of Adderall for the last 2 weeks and notes that since running out of it, his anxiety was worse, and was accompanied by an upset stomach. He denies having any panic attacks. He denies racing heart rate and palpitations while taking Adderall. He reports some trouble sleeping and has taken Seroquel to treat this. He also notes that his headaches resolved and his appetite increased while  taking Adderall. He notes that he has not been drinking enough water.  Infection:  He also notes that he feels as though he hears himself as an echo due to feeling that there is fluid buildup in his ears. He notes that he has had some chills as well. He denies taking any OTC decongestants and denies sinus pressure or pain as well. He notes that he has had an ingrown hair on the back of his neck as well.   Patient Active Problem List   Diagnosis Date Noted  . Moderate episode of recurrent major depressive disorder ([place]) 08/31/2017  . 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 Disturbed concentration 08/31/2017  . Tobacco use disorder 08/31/2017  . History of substance abuse 02/23/2017  . Depression 02/02/2013   Past Medical History:  Diagnosis Date  . Depression    Past Surgical History:  Procedure Laterality Date  . GALLBLADDER SURGERY  2003   No Known Allergies Prior to Admission medications   Medication Sig Start Date End Date Taking? Authorizing Provider  albuterol (PROVENTIL HFA;VENTOLIN HFA) 108 (90 Base) MCG/ACT inhaler Inhale 2 puffs into the lungs every 6 (six) hours as needed for wheezing or shortness of breath. 09/13/17  Yes Forrest Moron, MD  amphetamine-dextroamphetamine (ADDERALL) 10 MG tablet Take 1 tablet (10 mg total) by mouth 2 (two) times daily with a meal. 09/27/17  Yes Shawnee Knapp, MD  clonazePAM [facility])  0.5 MG tablet Take 1 tablet (0.5 mg total) by mouth 3 (three) times daily as needed for anxiety. 07/31/17  Yes Shawnee Knapp, MD  FLUoxetine (PROZAC) 20 MG tablet Take 1.5 tablets (30 mg total) by mouth daily. 08/29/17  Yes Shawnee Knapp, MD  fluticasone [facility]) 50 MCG/ACT nasal spray Place 2 sprays into both nostrils daily. 09/13/17  Yes Stallings, Zoe A, MD  gabapentin (NEURONTIN) 300 MG capsule TAKE 1 CAPSULE TWICE A DAY AS NEEDED FOR ANXIETY/PAIN MANAGEMENT AND 2 CAPSULES AT BEDTIME 07/31/17  Yes Shawnee Knapp, MD  azithromycin (ZITHROMAX) 250 MG tablet Take 2 tabs on  day one then one tab each day after Patient not taking: Reported on 11/10/2017 09/13/17   Forrest Moron, MD   Social History   Socioeconomic History  . Marital status: Divorced    Spouse name: Not on file  . Number of children: Not on file  . Years of education: Not on file  . Highest education level: Not on file  Occupational History  . Not on file  Social Needs  . Financial resource strain: Not on file  . Food insecurity:    Worry: Not on file    Inability: Not on file  . Transportation needs:    Medical: Not on file    Non-medical: Not on file  Tobacco Use  . Smoking status: Current Every Day Smoker    Packs/day: 3.00    Types: Cigarettes  . Smokeless tobacco: Never Used  Substance and Sexual Activity  . Alcohol use: No  . Drug use: No    Frequency: 7.0 times per week    Types: IV  . Sexual activity: Not on file  Lifestyle  . Physical activity:    Days per week: Not on file    Minutes per session: Not on file  . Stress: Not on file  Relationships  . Social connections:    Talks on phone: Not on file    Gets together: Not on file    Attends religious service: Not on file    Active member of club or organization: Not on file    Attends meetings of clubs or organizations: Not on file    Relationship status: Not on file  . Intimate partner violence:    Fear of current or ex partner: Not on file    Emotionally abused: Not on file    Physically abused: Not on file    Forced sexual activity: Not on file  Other Topics Concern  . Not on file  Social History Narrative  . Not on file    Review of Systems  Constitutional: Positive for appetite change (Increased) and chills.  HENT: Negative for sinus pressure and sinus pain.   Gastrointestinal: Positive for abdominal pain.  Neurological: Negative for headaches.  Psychiatric/Behavioral: Positive for decreased concentration. The patient is nervous/anxious.        Objective:   Physical Exam  Constitutional: He  is oriented to person, place, and time. He appears well-developed and well-nourished.  HENT:  Head: Normocephalic and atraumatic.  Right Ear: Tympanic membrane is injected and retracted.  Left Ear: Tympanic membrane is injected and retracted.  Erythema in oropharynx and nares   Cardiovascular: Normal rate and regular rhythm.  Pulmonary/Chest: Effort normal and breath sounds normal. No respiratory distress. He has no wheezes. He has no rales.  Neurological: He is alert and oriented to person, place, and time.  Skin: Skin is warm and dry.  Left posterior  neck, 1.5cm diameter subcutaenous mass with crusting and slight ulceration over mild erythema No fluctuance, warmth, tenderness, or drainage.   Psychiatric: His behavior is normal.   Vitals:   11/10/17 1625  BP: 118/70  Pulse: 78  Resp: 18  Temp: 98.1 F (36.7 C)  TempSrc: Oral  SpO2: 98%  Weight: 176 lb 6.4 oz (80 kg)  Height: 6' 0.5" (1.842 m)          Results for orders placed or performed in visit on 02/24/14  Testosterone  Result Value Ref Range   Testosterone 427 300 - 890 ng/dL   No results found. No results found.  Assessment & Plan:  If no improvement in a couple of days, will call in a course of prednisone.  1. Sinus congestion   2. Non-recurrent acute serous otitis media of right ear   3. Other mixed anxiety disorders   4. Attention deficit disorder (ADD) in adult   5. Severe episode of recurrent major depressive disorder, without psychotic features ([place])   6. History of substance abuse      Meds ordered this encounter  Medications  . cetirizine-pseudoephedrine (ZYRTEC-D) 5-120 MG tablet    Sig: Take 1 tablet by mouth 2 (two) times daily.    Dispense:  30 tablet    Refill:  1  . triamcinolone (NASACORT) 55 MCG/ACT AERO nasal inhaler    Sig: Place 2 sprays into the nose daily.    Dispense:  1 Inhaler    Refill:  12  . doxycycline (VIBRA-TABS) 100 MG tablet    Sig: Take 1 tablet (100 mg total) by  mouth 2 (two) times daily.    Dispense:  20 tablet    Refill:  0  . amphetamine-dextroamphetamine (ADDERALL) 15 MG tablet    Sig: Take 1 tablet by mouth daily.    Dispense:  30 tablet    Refill:  0  . gabapentin (NEURONTIN) 300 MG capsule    Sig: TAKE 1 CAPSULE TWICE A DAY AS NEEDED FOR ANXIETY/PAIN MANAGEMENT AND 2 CAPSULES AT BEDTIME    Dispense:  120 capsule    Refill:  1    I personally performed the services described in this documentation, which was scribed in my presence. The recorded information has been reviewed and considered, and addended by me as needed.   [name], M.D.  Primary Care at [facility] [address] [place], [place] [address] [phone] phone [phone] fax  12/08/17 6:17 PM

## 2017-11-10 NOTE — Patient Instructions (Addendum)
Hot showers or breathing in steam may help loosen the congestion.  Using a netti pot or sinus rinse is also likely to help you feel better and keep this from progressing.  Use the fluticasone OR triamcinolone nasal spray every night before bed for at least 2 weeks.  I recommend augmenting with 12 hr zyrtec-D (which is cetirizine with 12 hrs sudafed (avail behind the counter)) and generic mucinex to help you move out the congestion.  If no improvement or you are getting worse, call as you might need a course of steroids but hopefully with all of the above, you can avoid it.     IF you received an x-ray today, you will receive an invoice from [facility]. Please contact [facility] at [phone] with questions or concerns regarding your invoice.   IF you received labwork today, you will receive an invoice from [facility]. Please contact [facility] at [phone] with questions or concerns regarding your invoice.   Our billing staff will not be able to assist you with questions regarding bills from these companies.  You will be contacted with the lab results as soon as they are available. The fastest way to get your results is to activate your My Chart account. Instructions are located on the last page of this paperwork. If you have not heard from [place] regarding the results in 2 weeks, please contact this office.     Eustachian Tube Dysfunction The eustachian tube connects the middle ear to the back of the nose. It regulates air pressure in the middle ear by allowing air to move between the ear and nose. It also helps to drain fluid from the middle ear space. When the eustachian tube does not function properly, air pressure, fluid, or both can build up in the middle ear. Eustachian tube dysfunction can affect one or both ears. What are the causes? This condition happens when the eustachian tube becomes blocked or cannot open normally. This may result from:  Ear infections.  Colds  and other upper respiratory infections.  Allergies.  Irritation, such as from cigarette smoke or acid from the stomach coming up into the esophagus (gastroesophageal reflux).  Sudden changes in air pressure, such as from descending in an airplane.  Abnormal growths in the nose or throat, such as nasal polyps, tumors, or enlarged tissue at the back of the throat (adenoids).  What increases the risk? This condition may be more likely to develop in people who smoke and people who are overweight. Eustachian tube dysfunction may also be more likely to develop in children, especially children who have:  Certain birth defects of the mouth, such as cleft palate.  Large tonsils and adenoids.  What are the signs or symptoms? Symptoms of this condition may include:  A feeling of fullness in the ear.  Ear pain.  Clicking or popping noises in the ear.  Ringing in the ear.  Hearing loss.  Loss of balance.  Symptoms may get worse when the air pressure around you changes, such as when you travel to an area of high elevation or fly on an airplane. How is this diagnosed? This condition may be diagnosed based on:  Your symptoms.  A physical exam of your ear, nose, and throat.  Tests, such as those that measure: ? The movement of your eardrum (tympanogram). ? Your hearing (audiometry).  How is this treated? Treatment depends on the cause and severity of your condition. If your symptoms are mild, you may be able to relieve  your symptoms by moving air into ("popping") your ears. If you have symptoms of fluid in your ears, treatment may include:  Decongestants.  Antihistamines.  Nasal sprays or ear drops that contain medicines that reduce swelling (steroids).  In some cases, you may need to have a procedure to drain the fluid in your eardrum (myringotomy). In this procedure, a small tube is placed in the eardrum to:  Drain the fluid.  Restore the air in the middle ear  space.  Follow these instructions at home:  Take over-the-counter and prescription medicines only as told by your health care provider.  Use techniques to help pop your ears as recommended by your health care provider. These may include: ? Chewing gum. ? Yawning. ? Frequent, forceful swallowing. ? Closing your mouth, holding your nose closed, and gently blowing as if you are trying to blow air out of your nose.  Do not do any of the following until your health care provider approves: ? Travel to high altitudes. ? Fly in airplanes. ? Work in a Pension scheme manager or room. ? Scuba dive.  Keep your ears dry. Dry your ears completely after showering or bathing.  Do not smoke.  Keep all follow-up visits as told by your health care provider. This is important. Contact a health care provider if:  Your symptoms do not go away after treatment.  Your symptoms come back after treatment.  You are unable to pop your ears.  You have: ? A fever. ? Pain in your ear. ? Pain in your head or neck. ? Fluid draining from your ear.  Your hearing suddenly changes.  You become very dizzy.  You lose your balance. This information is not intended to replace advice given to you by your health care provider. Make sure you discuss any questions you have with your health care provider. Document Released: 08/11/2015 Document Revised: 12/21/2015 Document Reviewed: 08/03/2014 Elsevier Interactive Patient Education  [employer].

## 2017-12-07 ENCOUNTER — Encounter: Payer: Self-pay | Admitting: Family Medicine

## 2017-12-07 ENCOUNTER — Other Ambulatory Visit: Payer: Self-pay | Admitting: Family Medicine

## 2017-12-09 ENCOUNTER — Other Ambulatory Visit: Payer: Self-pay | Admitting: Family Medicine

## 2017-12-09 NOTE — Telephone Encounter (Signed)
LOV  11/10/17 Dr. Brigitte Pulse Last refill 11/10/17/  #30  0 refill

## 2017-12-09 NOTE — Telephone Encounter (Signed)
Copied from [place] #[id number]. Topic: Inquiry >> Dec 09, 2017  9:17 AM Pricilla Handler wrote: Reason for CRM: Patient has requested a refill of amphetamine-dextroamphetamine (ADDERALL) 15 MG tablet. Patient has contacted the pharmacy. Patient's preferred pharmacy is [facility] # [address] [place] [phone] (Phone)  [phone] (Fax).

## 2017-12-10 ENCOUNTER — Telehealth: Payer: Self-pay

## 2017-12-10 MED ORDER — AMPHETAMINE-DEXTROAMPHETAMINE 15 MG PO TABS: 15.0000 mg | ORAL_TABLET | Freq: Every day | ORAL | 0 refills | Status: DC

## 2017-12-10 NOTE — Telephone Encounter (Signed)
Pt wants this to go to [facility] Pt is out of medication

## 2017-12-10 NOTE — Telephone Encounter (Signed)
Duplicate encounter, see refill encounter dated 12/09/17.

## 2017-12-10 NOTE — Telephone Encounter (Signed)
Please let pt know that refill was sent to [facility].

## 2017-12-10 NOTE — Telephone Encounter (Signed)
Copied from [place] #[id number]. Topic: Inquiry >> Dec 09, 2017  9:17 AM Pricilla Handler wrote: Reason for CRM: Patient has requested a refill of amphetamine-dextroamphetamine (ADDERALL) 15 MG tablet. Patient has contacted the pharmacy. Patient's preferred pharmacy is [facility] # [address], [place] [phone] (Phone)  [phone] (Fax).

## 2017-12-10 NOTE — Telephone Encounter (Signed)
Patient is requesting a refill of the following medications: Requested Prescriptions   Pending Prescriptions Disp Refills  . amphetamine-dextroamphetamine (ADDERALL) 15 MG tablet 30 tablet 0    Sig: Take 1 tablet by mouth daily.    Date of patient request:12/09/17 Last office visit: 11/10/17 Date of last refill: 11/10/17 Last refill amount: 30 Follow up time period per chart: n/a   [facility] is pt pharmacy.  Please advise. Dgaddy, CMA

## 2017-12-10 NOTE — Telephone Encounter (Signed)
PT called to check on status of refill for adderall

## 2017-12-11 ENCOUNTER — Telehealth: Payer: Self-pay | Admitting: *Deleted

## 2017-12-11 ENCOUNTER — Encounter: Payer: Self-pay | Admitting: Family Medicine

## 2017-12-11 ENCOUNTER — Other Ambulatory Visit: Payer: Self-pay

## 2017-12-11 ENCOUNTER — Ambulatory Visit (INDEPENDENT_AMBULATORY_CARE_PROVIDER_SITE_OTHER): Payer: Self-pay | Admitting: Family Medicine

## 2017-12-11 VITALS — BP 102/80 | HR 74 | Temp 98.4°F | Resp 18 | Ht 72.5 in | Wt 180.6 lb

## 2017-12-11 DIAGNOSIS — J0101 Acute recurrent maxillary sinusitis: Secondary | ICD-10-CM

## 2017-12-11 DIAGNOSIS — F332 Major depressive disorder, recurrent severe without psychotic features: Secondary | ICD-10-CM

## 2017-12-11 DIAGNOSIS — H6990 Unspecified Eustachian tube disorder, unspecified ear: Secondary | ICD-10-CM

## 2017-12-11 DIAGNOSIS — F988 Other specified behavioral and emotional disorders with onset usually occurring in childhood and adolescence: Secondary | ICD-10-CM

## 2017-12-11 DIAGNOSIS — H6980 Other specified disorders of Eustachian tube, unspecified ear: Secondary | ICD-10-CM

## 2017-12-11 DIAGNOSIS — F413 Other mixed anxiety disorders: Secondary | ICD-10-CM

## 2017-12-11 MED ORDER — FLUOXETINE HCL 20 MG PO TABS: 30.0000 mg | ORAL_TABLET | Freq: Every day | ORAL | 0 refills | Status: DC

## 2017-12-11 MED ORDER — AMPHETAMINE-DEXTROAMPHETAMINE 15 MG PO TABS: 15.0000 mg | ORAL_TABLET | Freq: Every day | ORAL | 0 refills | Status: DC

## 2017-12-11 MED ORDER — PREDNISONE 20 MG PO TABS: ORAL_TABLET | ORAL | 0 refills | Status: DC

## 2017-12-11 MED ORDER — CETIRIZINE-PSEUDOEPHEDRINE ER 5-120 MG PO TB12: 1.0000 | ORAL_TABLET | Freq: Two times a day (BID) | ORAL | 1 refills | Status: DC

## 2017-12-11 MED ORDER — GABAPENTIN 300 MG PO CAPS: ORAL_CAPSULE | ORAL | 1 refills | Status: DC

## 2017-12-11 MED ORDER — TRIAMCINOLONE ACETONIDE 55 MCG/ACT NA AERO: 2.0000 | INHALATION_SPRAY | Freq: Every day | NASAL | 12 refills | Status: DC

## 2017-12-11 NOTE — Patient Instructions (Addendum)
     IF you received an x-ray today, you will receive an invoice from [facility]. Please contact [facility] at [phone] with questions or concerns regarding your invoice.   IF you received labwork today, you will receive an invoice from [place]. Please contact [facility] at [phone] with questions or concerns regarding your invoice.   Our billing staff will not be able to assist you with questions regarding bills from these companies.  You will be contacted with the lab results as soon as they are available. The fastest way to get your results is to activate your My Chart account. Instructions are located on the last page of this paperwork. If you have not heard from [place] regarding the results in 2 weeks, please contact this office.    Sinus Rinse What is a sinus rinse? A sinus rinse is a simple home treatment that is used to rinse your sinuses with a sterile mixture of salt and water (saline solution). Sinuses are air-filled spaces in your skull behind the bones of your face and forehead that open into your nasal cavity. You will use the following:  Saline solution.  Neti pot or spray bottle. This releases the saline solution into your nose and through your sinuses. Neti pots and spray bottles can be purchased at [employer], a health food store, or online.  When would I do a sinus rinse? A sinus rinse can help to clear mucus, dirt, dust, or pollen from the nasal cavity. You may do a sinus rinse when you have a cold, a virus, nasal allergy symptoms, a sinus infection, or stuffiness in the nose or sinuses. If you are considering a sinus rinse:  Ask your child's health care provider before performing a sinus rinse on your child.  Do not do a sinus rinse if you have had ear or nasal surgery, ear infection, or blocked ears.  How do I do a sinus rinse?  Wash your hands.  Disinfect your device according to the directions provided and then dry it.  Use  the solution that comes with your device or one that is sold separately in stores. Follow the mixing directions on the package.  Fill your device with the amount of saline solution as directed by the device instructions.  Stand over a sink and tilt your head sideways over the sink.  Place the spout of the device in your upper nostril (the one closer to the ceiling).  Gently pour or squeeze the saline solution into the nasal cavity. The liquid should drain to the lower nostril if you are not overly congested.  Gently blow your nose. Blowing too hard may cause ear pain.  Repeat in the other nostril.  Clean and rinse your device with clean water and then air-dry it. Are there risks of a sinus rinse? Sinus rinse is generally very safe and effective. However, there are a few risks, which include:  A burning sensation in the sinuses. This may happen if you do not make the saline solution as directed. Make sure to follow all directions when making the saline solution.  Infection from contaminated water. This is rare, but possible.  Nasal irritation.  This information is not intended to replace advice given to you by your health care provider. Make sure you discuss any questions you have with your health care provider. Document Released: 02/09/2014 Document Revised: 06/11/2016 Document Reviewed: 11/30/2013 Elsevier Interactive Patient Education  2017 [employer].

## 2017-12-11 NOTE — Telephone Encounter (Signed)
LMOVM per Dr. Raul Del message - Refill Adderall called to [facility]

## 2017-12-11 NOTE — Telephone Encounter (Signed)
Pt advised Adderall med refill sent to pharmacy.

## 2017-12-11 NOTE — Progress Notes (Signed)
Subjective:  By signing my name below, I, Louis Zimmerman, attest that this documentation has been prepared under the direction and in the presence of Louis Cheadle, MD. Electronically Signed: [name], [place]. 12/11/2017 , 6:50 PM .  Patient was seen in Room 3 .   Patient ID: Louis Zimmerman, male    DOB: Sep 16, 1960, 57 y.o.   MRN: [id number] Chief Complaint  Patient presents with  . Ear Pain    x2 months. right ear, Pt states it feels like he has water stuck in there. Pt states ear feels full with pressure. Pt states he has been using perixode without water. NO wax in ears.   HPI Louis Zimmerman is a 57 y.o. male who presents to Primary Care at [facility] complaining of right ear pain. He mentions having some sweat with hot flashes yesterday and today, although he believes it's attributed being off of his Adderall. He denies nasal congestion or sinus pressure. He was recently on doxycycline, prescribed on 11/10/17. He denies being on prednisone prior. He notes blowing out stuff every morning through his nose ever since he's been off substance abuse.   Depression/anxiety + ADD Recently started him on stimulant therapy for adult ADD. He had failed Wellbutrin though he was taking it in the evening. He had history of substance abuse, though he's very active in NA for many years. He was started on Adderall 10mg  BID, which was filled on 2/1 and 3/2. At last visit, it was changed to Adderall 15mg  QD, filled 4/15. He has also been struggling with severe anxiety and depression. His symptoms were improved on clonazepam 0.5mg  TID and gabapentin 300mg  TID. Patient improved; he would need to come off clonazepam when on stimulant, which he was reluctant, which he had not have problem with.   Patient states he's doing well on Adderall 15mg  QD, and doing well for him. He's having some hot flashes, believes it's due to Adderall withdrawal, as he hasn't been on it today. He denies any tolerance to the medication.  He notes he's sleeping well at night. He denies chest pain, palpitations, or headaches. He drinks a cup of coffee and a Monster energy drink while on the way to work, where he walks about 4-9 miles per day. He's currently just taking Prozac, and has stopped taking clonazepam.   Family He mentions having two daughters, Louis Zimmerman in [place], and Louis Zimmerman in [place], who also have similar anxiety as him. He informs Louis Zimmerman was recently started on Buspar for her history of anxiety, and Louis Zimmerman recently had a panic attack this past Saturday while at her cousin's graduation party due to loud noises. He states Louis Zimmerman usually stays with him, but will stay with Louis Zimmerman for the summer at [facility].   Past Medical History:  Diagnosis Date  . Depression    Past Surgical History:  Procedure Laterality Date  . GALLBLADDER SURGERY  2003   Prior to Admission medications   Medication Sig Start Date End Date Taking? Authorizing Provider  albuterol (PROVENTIL HFA;VENTOLIN HFA) 108 (90 Base) MCG/ACT inhaler Inhale 2 puffs into the lungs every 6 (six) hours as needed for wheezing or shortness of breath. 09/13/17   Louis Moron, MD  amphetamine-dextroamphetamine (ADDERALL) 15 MG tablet Take 1 tablet by mouth daily. 12/10/17   Louis Knapp, MD  cetirizine-pseudoephedrine (ZYRTEC-D) 5-120 MG tablet Take 1 tablet by mouth 2 (two) times daily. 11/10/17   Louis Knapp, MD  clonazePAM (KLONOPIN) 0.5 MG tablet Take  1 tablet (0.5 mg total) by mouth 3 (three) times daily as needed for anxiety. 07/31/17   Louis Knapp, MD  doxycycline (VIBRA-TABS) 100 MG tablet Take 1 tablet (100 mg total) by mouth 2 (two) times daily. 11/10/17   Louis Knapp, MD  FLUoxetine (PROZAC) 20 MG tablet Take 1.5 tablets (30 mg total) by mouth daily. 08/29/17   Louis Knapp, MD  fluticasone (FLONASE) 50 MCG/ACT nasal spray Place 2 sprays into both nostrils daily. 09/13/17   Louis Moron, MD  gabapentin (NEURONTIN) 300 MG capsule TAKE 1 CAPSULE TWICE A  DAY AS NEEDED FOR ANXIETY/PAIN MANAGEMENT AND 2 CAPSULES AT BEDTIME 11/10/17   Louis Knapp, MD  triamcinolone (NASACORT) 55 MCG/ACT AERO nasal inhaler Place 2 sprays into the nose daily. 11/10/17   Louis Knapp, MD   No Known Allergies Family History  Problem Relation Age of Onset  . Cancer Mother   . Dementia Mother   . Diabetes Mother   . Heart Problems Mother   . Liver cancer Father   . Colon cancer Father   . Alcohol abuse Maternal Grandfather   . Alcohol abuse Paternal Grandfather   . Colon polyps Sister   . Colon polyps Sister    Social History   Socioeconomic History  . Marital status: Divorced    Spouse name: Not on file  . Number of children: Not on file  . Years of education: Not on file  . Highest education level: Not on file  Occupational History  . Not on file  Social Needs  . Financial resource strain: Not on file  . Food insecurity:    Worry: Not on file    Inability: Not on file  . Transportation needs:    Medical: Not on file    Non-medical: Not on file  Tobacco Use  . Smoking status: Current Every Day Smoker    Packs/day: 3.00    Types: Cigarettes  . Smokeless tobacco: Never Used  Substance and Sexual Activity  . Alcohol use: No  . Drug use: No    Frequency: 7.0 times per week    Types: IV  . Sexual activity: Not on file  Lifestyle  . Physical activity:    Days per week: Not on file    Minutes per session: Not on file  . Stress: Not on file  Relationships  . Social connections:    Talks on phone: Not on file    Gets together: Not on file    Attends religious service: Not on file    Active member of club or organization: Not on file    Attends meetings of clubs or organizations: Not on file    Relationship status: Not on file  Other Topics Concern  . Not on file  Social History Narrative  . Not on file   Depression screen [facility] 2/9 12/11/2017 11/10/2017 09/13/2017 08/29/2017 08/18/2017  Decreased Interest 0 0 0 1 0  Down, Depressed, Hopeless 0 0  0 0 0  PHQ - 2 Score 0 0 0 1 0  Altered sleeping - - - - -  Tired, decreased energy - - - - -  Change in appetite - - - - -  Feeling bad or failure about yourself  - - - - -  Trouble concentrating - - - - -  Moving slowly or fidgety/restless - - - - -  Suicidal thoughts - - - - -  PHQ-9 Score - - - - -  Difficult doing work/chores - - - - -    Review of Systems  Constitutional: Negative for fatigue and unexpected weight change.       Sweats, hot flushes  HENT: Positive for ear pain and hearing loss. Negative for congestion, rhinorrhea, sinus pressure, sinus pain and sore throat.   Eyes: Negative for visual disturbance.  Respiratory: Negative for cough, chest tightness and shortness of breath.   Cardiovascular: Negative for chest pain, palpitations and leg swelling.  Gastrointestinal: Negative for abdominal pain and Zimmerman in stool.  Neurological: Negative for dizziness, light-headedness and headaches.       Objective:   Physical Exam  Constitutional: He is oriented to person, place, and time. He appears well-developed and well-nourished. No distress.  HENT:  Head: Normocephalic and atraumatic.  Right Ear: Tympanic membrane is injected and erythematous. A middle ear effusion is present.  Nose: Rhinorrhea (purulent) present.  Mouth/Throat: Posterior oropharyngeal erythema present.  Oropharynx with postnasal drip  Eyes: Pupils are equal, round, and reactive to light. EOM are normal.  Neck: Neck supple.  Cardiovascular: Normal rate.  Pulmonary/Chest: Effort normal. No respiratory distress.  Musculoskeletal: Normal range of motion.  Lymphadenopathy:    He has no cervical adenopathy.  Neurological: He is alert and oriented to person, place, and time.  Skin: Skin is warm and dry.  Psychiatric: He has a normal mood and affect. His behavior is normal.  Nursing note and vitals reviewed.   BP 102/80 (BP Location: Left Arm, Patient Position: Sitting, Cuff Size: Normal)   Pulse 74    Temp 98.4 F (36.9 C) (Oral)   Resp 18   Ht 6' 0.5" (1.842 m)   Wt 180 lb 9.6 oz (81.9 kg)   SpO2 98%   BMI 24.16 kg/m      Assessment & Plan:   1. Severe episode of recurrent major depressive disorder, without psychotic features ([place])   2. Attention deficit disorder (ADD) in adult   3. Other mixed anxiety disorders   4. Acute recurrent maxillary sinusitis   5. Eustachian tube dysfunction, unspecified laterality     Meds ordered this encounter  Medications  . FLUoxetine (PROZAC) 20 MG tablet    Sig: Take 1.5 tablets (30 mg total) by mouth daily.    Dispense:  135 tablet    Refill:  0  . amphetamine-dextroamphetamine (ADDERALL) 15 MG tablet    Sig: Take 1 tablet by mouth daily.    Dispense:  30 tablet    Refill:  0  . gabapentin (NEURONTIN) 300 MG capsule    Sig: TAKE 1 CAPSULE TWICE A DAY AS NEEDED FOR ANXIETY/PAIN MANAGEMENT AND 2 CAPSULES AT BEDTIME    Dispense:  120 capsule    Refill:  1    Please add these on as 2 additional refills to current rx  . DISCONTD: amphetamine-dextroamphetamine (ADDERALL) 15 MG tablet    Sig: Take 1 tablet by mouth daily.    Dispense:  30 tablet    Refill:  0  . predniSONE (DELTASONE) 20 MG tablet    Sig: Take 3 tabs qd x 3d, then 2 tabs qd x 2d then 1 tab qd x 2d.    Dispense:  15 tablet    Refill:  0  . cetirizine-pseudoephedrine (ZYRTEC-D) 5-120 MG tablet    Sig: Take 1 tablet by mouth 2 (two) times daily.    Dispense:  30 tablet    Refill:  1  . triamcinolone (NASACORT) 55 MCG/ACT AERO nasal inhaler  Sig: Place 2 sprays into the nose daily.    Dispense:  1 Inhaler    Refill:  12    I personally performed the services described in this documentation, which was scribed in my presence. The recorded information has been reviewed and considered, and addended by me as needed.   Louis Zimmerman, M.D.  Primary Care at [facility] [address] [place],  [address] [phone] phone [phone] fax  03/28/18 8:22  AM

## 2018-02-19 ENCOUNTER — Encounter: Payer: Self-pay | Admitting: Family Medicine

## 2018-02-19 ENCOUNTER — Ambulatory Visit: Payer: Self-pay | Admitting: Family Medicine

## 2018-02-19 VITALS — BP 92/58 | HR 88 | Temp 97.6°F | Resp 16 | Ht 72.5 in | Wt 181.6 lb

## 2018-02-19 DIAGNOSIS — F413 Other mixed anxiety disorders: Secondary | ICD-10-CM

## 2018-02-19 DIAGNOSIS — F331 Major depressive disorder, recurrent, moderate: Secondary | ICD-10-CM

## 2018-02-19 DIAGNOSIS — F4320 Adjustment disorder, unspecified: Secondary | ICD-10-CM

## 2018-02-19 DIAGNOSIS — F988 Other specified behavioral and emotional disorders with onset usually occurring in childhood and adolescence: Secondary | ICD-10-CM

## 2018-02-19 DIAGNOSIS — R454 Irritability and anger: Secondary | ICD-10-CM

## 2018-02-19 DIAGNOSIS — F17200 Nicotine dependence, unspecified, uncomplicated: Secondary | ICD-10-CM

## 2018-02-19 DIAGNOSIS — B0089 Other herpesviral infection: Secondary | ICD-10-CM

## 2018-02-19 MED ORDER — TRIAMCINOLONE ACETONIDE 55 MCG/ACT NA AERO: 2.0000 | INHALATION_SPRAY | Freq: Every day | NASAL | 12 refills | Status: DC

## 2018-02-19 MED ORDER — AMPHETAMINE-DEXTROAMPHETAMINE 15 MG PO TABS: 15.0000 mg | ORAL_TABLET | Freq: Every day | ORAL | 0 refills | Status: DC

## 2018-02-19 MED ORDER — CLONAZEPAM 0.5 MG PO TABS: 0.5000 mg | ORAL_TABLET | Freq: Every day | ORAL | 1 refills | Status: DC

## 2018-02-19 MED ORDER — GABAPENTIN 300 MG PO CAPS: ORAL_CAPSULE | ORAL | 1 refills | Status: DC

## 2018-02-19 MED ORDER — FLUOXETINE HCL 20 MG PO TABS: 40.0000 mg | ORAL_TABLET | Freq: Every day | ORAL | 1 refills | Status: DC

## 2018-02-19 MED ORDER — ACYCLOVIR 400 MG PO TABS: 400.0000 mg | ORAL_TABLET | Freq: Two times a day (BID) | ORAL | 1 refills | Status: DC

## 2018-02-19 NOTE — Patient Instructions (Addendum)
Try the [place] OTC lidocaine patches 4% (your prescription are dramatically more expensive at just a slightly stronger 5% strength).  Adderall rxs on file w/ pharm to be filled on or afer 8/16, 9/13, and 10/11    IF you received an x-ray today, you will receive an invoice from [facility]. Please contact [facility] at [phone] with questions or concerns regarding your invoice.   IF you received labwork today, you will receive an invoice from [place]. Please contact [facility] at [phone] with questions or concerns regarding your invoice.   Our billing staff will not be able to assist you with questions regarding bills from these companies.  You will be contacted with the lab results as soon as they are available. The fastest way to get your results is to activate your My Chart account. Instructions are located on the last page of this paperwork. If you have not heard from [place] regarding the results in 2 weeks, please contact this office.     Coping With Loss, Adult People experience loss in many different ways throughout their lives. Events such as moving, changing jobs, and losing friends can create a sense of loss. The loss may be as serious as a major health change, divorce, death of a pet, or death of a loved one. All of these types of loss are likely to create a physical and emotional reaction known as grief. Grief is the result of a major change or an absence of something or someone that you count on. Grief is a normal reaction to loss. How to recognize changes A variety of factors can affect your grieving experience, including:  The nature of your loss.  Your relationship to what or whom you lost.  Your understanding of grief and how to cope with it.  Your support system.  The way that you deal with your grief will affect your ability to function as you normally do. When you are grieving, you may experience:  Numbness, shock, sadness, anxiety, anger,  denial, and guilt.  Thoughts about death.  Unexpected crying.  A physical sensation of emptiness in your gut.  Problems sleeping and eating.  Fatigue.  Loss of interest in normal activities.  Dreaming about or imagining seeing the person who died.  A need to remember what or whom you lost.  Difficulty thinking about anything other than your loss for a period of time.  Relief. If you have been expecting the loss for a while, you may feel a sense of relief when it happens.  Where to find support To get support for coping with loss:  Ask your health care provider for help and recommendations, such as grief counseling or therapy.  Think about joining a support group for people who are coping with loss.  Follow these instructions at home:  Be patient with yourself and others. Allow the grieving process to happen, and remember that grieving takes time. ? It is likely that you may never feel completely done with some grief. You may find a way to move on while still cherishing memories and feelings about your loss. ? Accepting your loss is a process. It can take months or longer to adjust.  Express your feelings in healthy ways, such as: ? Talking with others about your loss. It may be helpful to find others who have had a similar loss, such as a support group. ? Writing down your feelings in a journal. ? Doing physical activities to release stress and emotional energy. ?  Doing creative activities like painting, sculpting, or playing or listening to music. ? Practicing resilience. This is the ability to recover and adjust after facing challenges. Reading some resources that encourage resilience may help you to learn ways to practice those behaviors.  Keep to your normal routine as much as possible. If you have trouble focusing or doing normal activities, it is acceptable to take some time away from your normal routine.  Spend time with friends and loved ones.  Eat a healthy  diet, get plenty of sleep, and rest when you feel tired. Where to find more information: You can find more information about coping with loss from:  American Society of Clinical Oncology: www.cancer.net  American Psychological Association: TVStereos.ch  Contact a health care provider if:  Your grief is extreme and keeps getting worse.  You have ongoing grief that does not improve.  Your body shows symptoms of grief, such as illness.  You feel depressed, anxious, or lonely. Get help right away if:  You have thoughts about hurting yourself or others. If you ever feel like you may hurt yourself or others, or have thoughts about taking your own life, get help right away. You can go to your nearest emergency department or call:  Your local emergency services (911 in the [place]  A suicide crisis helpline, such as the [place] at 3436050827. This is open 24 hours a day.  Summary  Grief is a normal part of experiencing a loss. It is the result of a major change or an absence of something or someone that you count on.  The depth of grief and the period of recovery depend on the type of loss as well as your ability to adjust to the change and process your feelings.  Processing grief requires patience and a willingness to accept your feelings and talk about your loss with people who are supportive.  It is important to find resources that work for you and to realize that we are all different when it comes to grief. There is not one single grieving process that works for everyone in the same way.  Be aware that when grief becomes extreme, it can lead to more severe issues like isolation, depression, anxiety, or suicidal thoughts. Talk with your health care provider if you have any of these issues. This information is not intended to replace advice given to you by your health care provider. Make sure you discuss any questions you have with your health care  provider. Document Released: 11/28/2016 Document Revised: 11/28/2016 Document Reviewed: 11/28/2016 Elsevier Interactive Patient Education  2018 [employer].
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
## 2018-03-28 ENCOUNTER — Encounter: Payer: Self-pay | Admitting: Family Medicine

## 2018-03-28 ENCOUNTER — Other Ambulatory Visit: Payer: Self-pay

## 2018-03-28 ENCOUNTER — Ambulatory Visit (INDEPENDENT_AMBULATORY_CARE_PROVIDER_SITE_OTHER): Payer: Self-pay | Admitting: Family Medicine

## 2018-03-28 VITALS — BP 95/60 | HR 74 | Temp 98.0°F | Resp 16 | Ht 72.5 in | Wt 182.4 lb

## 2018-03-28 DIAGNOSIS — M546 Pain in thoracic spine: Secondary | ICD-10-CM

## 2018-03-28 DIAGNOSIS — D229 Melanocytic nevi, unspecified: Secondary | ICD-10-CM

## 2018-03-28 MED ORDER — GABAPENTIN 300 MG PO CAPS: ORAL_CAPSULE | ORAL | 1 refills | Status: DC

## 2018-03-28 NOTE — Patient Instructions (Addendum)
If you have lab work done today you will be contacted with your lab results within the next 2 weeks.  If you have not heard from [place] then please contact us. The fastest way to get your results is to register for My Chart.   IF you received an x-ray today, you will receive an invoice from [facility]. Please contact [facility] at [phone] with questions or concerns regarding your invoice.   IF you received labwork today, you will receive an invoice from [facility]. Please contact [facility] at [phone] with questions or concerns regarding your invoice.   Our billing staff will not be able to assist you with questions regarding bills from these companies.  You will be contacted with the lab results as soon as they are available. The fastest way to get your results is to activate your My Chart account. Instructions are located on the last page of this paperwork. If you have not heard from [place] regarding the results in 2 weeks, please contact this office.     Skin Biopsy, Care After Refer to this sheet in the next few weeks. These instructions provide you with information about caring for yourself after your procedure. Your health care provider may also give you more specific instructions. Your treatment has been planned according to current medical practices, but problems sometimes occur. Call your health care provider if you have any problems or questions after your procedure. What can I expect after the procedure? After the procedure, it is common to have:  Soreness.  Bruising.  Itching.  Follow these instructions at home:  Rest and then return to your normal activities as told by your health care provider.  Take over-the-counter and prescription medicines only as told by your health care provider.  Follow instructions from your health care provider about how to take care of your biopsy site.Make sure you: ? Wash your hands with soap and water before you  change your bandage (dressing). If soap and water are not available, use hand sanitizer. ? Change your dressing as told by your health care provider. ? Leave stitches (sutures), skin glue, or adhesive strips in place. These skin closures may need to stay in place for 2 weeks or longer. If adhesive strip edges start to loosen and curl up, you may trim the loose edges. Do not remove adhesive strips completely unless your health care provider tells you to do that. If the biopsy area bleeds, apply gentle pressure for 10 minutes.  Check your biopsy site every day for signs of infection. Check for: ? More redness, swelling, or pain. ? More fluid or blood. ? Warmth. ? Pus or a bad smell.  Keep all follow-up visits as told by your health care provider. This is important. Contact a health care provider if:  You have more redness, swelling, or pain around your biopsy site.  You have more fluid or blood coming from your biopsy site.  Your biopsy site feels warm to the touch.  You have pus or a bad smell coming from your biopsy site.  You have a fever. Get help right away if:  You have bleeding that does not stop with pressure or a dressing. This information is not intended to replace advice given to you by your health care provider. Make sure you discuss any questions you have with your health care provider. Document Released: 08/11/2015 Document Revised: 03/10/2016 Document Reviewed: 10/12/2014 Elsevier Interactive Patient Education  2018 [place]  Trigger Point Injection  Trigger points are areas where you have pain. A trigger point injection is a shot given in the trigger point to help relieve pain for a few days to a few months. Common places for trigger points include:  The neck.  The shoulders.  The upper back.  The lower back.  A trigger point injection will not cure long-lasting (chronic) pain permanently. These injections do not always work for every person, but for some  people they can help to relieve pain for a few days to a few months. Tell a health care provider about:  Any allergies you have.  All medicines you are taking, including vitamins, herbs, eye drops, creams, and over-the-counter medicines.  Any problems you or family members have had with anesthetic medicines.  Any blood disorders you have.  Any surgeries you have had.  Any medical conditions you have. What are the risks? Generally, this is a safe procedure. However, problems may occur, including:  Infection.  Bleeding.  Allergic reaction to the injected medicine.  Irritation of the skin around the injection site.  What happens before the procedure?  Ask your health care provider about changing or stopping your regular medicines. This is especially important if you are taking diabetes medicines or blood thinners. What happens during the procedure?  Your health care provider will feel for trigger points. A marker may be used to circle the area for the injection.  The skin over the trigger point will be washed with a germ-killing (antiseptic) solution.  A thin needle is used for the shot. You may feel pain or a twitching feeling when the needle enters the trigger point.  A numbing solution may be injected into the trigger point. Sometimes a medicine to keep down swelling, redness, and warmth (inflammation) is also injected.  Your health care provider may move the needle around the area where the trigger point is located until the tightness and twitching goes away.  After the injection, your health care provider may put gentle pressure over the injection site.  The injection site will be covered with a bandage (dressing). The procedure may vary among health care providers and hospitals. What happens after the procedure?  The dressing can be taken off in a few hours or as told by your health care provider.  You may feel sore and stiff for 1-2 days. This information is not  intended to replace advice given to you by your health care provider. Make sure you discuss any questions you have with your health care provider. Document Released: 07/04/2011 Document Revised: 03/17/2016 Document Reviewed: 01/02/2015 Elsevier Interactive Patient Education  2018 [employer].

## 2018-03-28 NOTE — Progress Notes (Signed)
Subjective:    Patient ID: Louis Zimmerman, male    DOB: 03/29/1961, 57 y.o.   MRN: [id number] Chief Complaint  Patient presents with  . Shoulder Pain    RIGHT  x 2 months per patient wants cortisone shot  . Medication Refill    gabapentin    HPI Muscle knot in his right upper thoracic back is still killing him - has been for >2 mos as noted it at our last visit 5 wks prior.  Has had a trigger point injection many times over the past sev decades that this flairs up on him and always takes care of it immed so would like to do try that today - has been > a yr since tensed up this bad and just can't get rhomboid muscle knot to release despite trying heat, top muscle rubs, direct pressure, stretching.   Has a mole on his right flank that has changes and looking suspicious.  Past Medical History:  Diagnosis Date  . Depression    Past Surgical History:  Procedure Laterality Date  . GALLBLADDER SURGERY  2003   Current Outpatient Medications on File Prior to Visit  Medication Sig Dispense Refill  . acyclovir (ZOVIRAX) 400 MG tablet Take 1 tablet (400 mg total) by mouth 2 (two) times daily. 180 tablet 1  . [START ON 05/08/2018] amphetamine-dextroamphetamine (ADDERALL) 15 MG tablet Take 1 tablet by mouth daily. 30 tablet 0  . amphetamine-dextroamphetamine (ADDERALL) 15 MG tablet Take 1 tablet by mouth daily. 30 tablet 0  . amphetamine-dextroamphetamine (ADDERALL) 15 MG tablet Take 1 tablet by mouth daily. 30 tablet 0  . clonazePAM (KLONOPIN) 0.5 MG tablet Take 1 tablet (0.5 mg total) by mouth at bedtime. As needed for panic attacks or severe anxiety 10 tablet 1  . FLUoxetine (PROZAC) 20 MG tablet Take 2 tablets (40 mg total) by mouth daily. 180 tablet 1   No current facility-administered medications on file prior to visit.    No Known Allergies Family History  Problem Relation Age of Onset  . Cancer Mother   . Dementia Mother   . Diabetes Mother   . Heart Problems Mother   .  Liver cancer Father   . Colon cancer Father   . Alcohol abuse Maternal Grandfather   . Alcohol abuse Paternal Grandfather   . Colon polyps Sister   . Colon polyps Sister    Social History   Socioeconomic History  . Marital status: Divorced    Spouse name: Not on file  . Number of children: Not on file  . Years of education: Not on file  . Highest education level: Not on file  Occupational History  . Not on file  Social Needs  . Financial resource strain: Not on file  . Food insecurity:    Worry: Not on file    Inability: Not on file  . Transportation needs:    Medical: Not on file    Non-medical: Not on file  Tobacco Use  . Smoking status: Current Every Day Smoker    Packs/day: 3.00    Types: Cigarettes  . Smokeless tobacco: Never Used  Substance and Sexual Activity  . Alcohol use: No  . Drug use: No    Frequency: 7.0 times per week    Types: IV  . Sexual activity: Not on file  Lifestyle  . Physical activity:    Days per week: Not on file    Minutes per session: Not on file  . Stress: Not  on file  Relationships  . Social connections:    Talks on phone: Not on file    Gets together: Not on file    Attends religious service: Not on file    Active member of club or organization: Not on file    Attends meetings of clubs or organizations: Not on file    Relationship status: Not on file  Other Topics Concern  . Not on file  Social History Narrative  . Not on file   Depression screen [facility] 2/9 03/28/2018 02/19/2018 12/11/2017 11/10/2017 09/13/2017  Decreased Interest 0 0 0 0 0  Down, Depressed, Hopeless 0 1 0 0 0  PHQ - 2 Score 0 1 0 0 0  Altered sleeping - - - - -  Tired, decreased energy - - - - -  Change in appetite - - - - -  Feeling bad or failure about yourself  - - - - -  Trouble concentrating - - - - -  Moving slowly or fidgety/restless - - - - -  Suicidal thoughts - - - - -  PHQ-9 Score - - - - -  Difficult doing work/chores - - - - -     Review of  Systems See hpi    Objective:   Physical Exam  Constitutional: He is oriented to person, place, and time. He appears well-developed and well-nourished. No distress.  HENT:  Head: Normocephalic and atraumatic.  Eyes: No scleral icterus.  Pulmonary/Chest: Effort normal.  Neurological: He is alert and oriented to person, place, and time.  Skin: Skin is warm and dry. He is not diaphoretic.  Changing mole on right flank with asymmetric streak medially  Psychiatric: He has a normal mood and affect. His behavior is normal.     BP 95/60   Pulse 74   Temp 98 F (36.7 C) (Oral)   Resp 16   Ht 6' 0.5" (1.842 m)   Wt 182 lb 6.4 oz (82.7 kg) Comment: with shoes  SpO2 94%   BMI 24.40 kg/m   Risks and benefits reviewed, and verbal informed consent obtained. Cleansed with alcohol 2 followed by Betadine 2. Anesthesia with subcutaneous administration of 1% lidocaine with epinephrine. Shave biopsy obtained using dermablade to site approximately 1 cm diameter. Biopsied skin sent for pathology. Hemostasis achieved with pressure. Biopsy site dressed with mupirocin in a pressure bandage. EBL less than 1 mL. No complications. Patient tolerated procedure well. Wound care reviewed in detail and all questions answered.  Right thoracic rhomboid trigger point in injection done today to right upper back. Risks and benefits reviewed, and verbal informed consent obtained.  Pt positioned lying prone but unable to identify trigger point at that angle so repositioned sitting upright. Point of maximal tenderness identified by palpation.  Skin cleansed with alcohol x 3. Anesthesia with ethyl chloride cold spray.  Injected with 40mg  of DepoMedrol and 4cc of 1% lidocaine using 22g 1 1/2 in needle into triggered spasmed muscle knot.  Pt tolerated procedure well. No comp. No EBL.  Assessment & Plan:   1. 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 Trigger point of  thoracic region - injected with Depo-Medrol 40 and lidocaine 1% plain 4cc today which has worked well for pt proir   Cont gabapentin for anxiety and chronic pain - dose unchanged.  Meds ordered this encounter  Medications  . gabapentin (NEURONTIN) 300 MG capsule    Sig: TAKE  1 CAPSULE TWICE A DAY AS NEEDED FOR ANXIETY/PAIN MANAGEMENT AND 2 CAPSULES AT BEDTIME    Dispense:  120 capsule    Refill:  1    Please add these on as 2 additional refills to current rx    [name], MD, MPH Primary Care at [facility] [address] [place], [place]  [address] [phone] Office phone  [phone] Office fax   04/26/18 2:44 AM

## 2018-04-09 ENCOUNTER — Telehealth: Payer: Self-pay | Admitting: Family Medicine

## 2018-04-09 NOTE — Telephone Encounter (Signed)
Copied from [place] [id number]. Topic: General - Other >> Apr 08, 2018  4:39 PM Ivar Drape wrote: Reason for CRM:   Patient stated that his Dermatology pathology results did not come through on [place].  Please resend or have an assist call with the results.

## 2018-04-10 ENCOUNTER — Telehealth: Payer: Self-pay | Admitting: Family Medicine

## 2018-04-10 NOTE — Telephone Encounter (Signed)
Called pt. To reschedule visit 05/28/18 with Dr. Brigitte Pulse. Rescheduled with pt.

## 2018-04-26 NOTE — Telephone Encounter (Signed)
Resent over [place]

## 2018-05-28 ENCOUNTER — Ambulatory Visit (INDEPENDENT_AMBULATORY_CARE_PROVIDER_SITE_OTHER): Payer: Self-pay | Admitting: Family Medicine

## 2018-05-28 ENCOUNTER — Ambulatory Visit: Payer: Self-pay | Admitting: Family Medicine

## 2018-05-28 ENCOUNTER — Other Ambulatory Visit: Payer: Self-pay

## 2018-05-28 ENCOUNTER — Encounter: Payer: Self-pay | Admitting: Family Medicine

## 2018-05-28 VITALS — BP 132/72 | HR 88 | Temp 98.1°F | Resp 16 | Ht 72.0 in | Wt 183.2 lb

## 2018-05-28 DIAGNOSIS — F988 Other specified behavioral and emotional disorders with onset usually occurring in childhood and adolescence: Secondary | ICD-10-CM

## 2018-05-28 DIAGNOSIS — C44619 Basal cell carcinoma of skin of left upper limb, including shoulder: Secondary | ICD-10-CM

## 2018-05-28 DIAGNOSIS — F331 Major depressive disorder, recurrent, moderate: Secondary | ICD-10-CM

## 2018-05-28 DIAGNOSIS — Z79899 Other long term (current) drug therapy: Secondary | ICD-10-CM

## 2018-05-28 DIAGNOSIS — F4321 Adjustment disorder with depressed mood: Secondary | ICD-10-CM

## 2018-05-28 MED ORDER — FLUOXETINE HCL 20 MG PO TABS: 20.0000 mg | ORAL_TABLET | Freq: Every day | ORAL | 1 refills | Status: DC

## 2018-05-28 MED ORDER — GABAPENTIN 300 MG PO CAPS: ORAL_CAPSULE | ORAL | 1 refills | Status: DC

## 2018-05-28 MED ORDER — AMPHETAMINE-DEXTROAMPHETAMINE 15 MG PO TABS: 15.0000 mg | ORAL_TABLET | Freq: Every day | ORAL | 0 refills | Status: DC

## 2018-05-28 NOTE — Progress Notes (Signed)
10/31/20199:20 AM  Louis Zimmerman 1960-11-06, 57 y.o. male [id number]  Chief Complaint  Patient presents with  . Medication Refill    Adderall 15 mg, Fluoxetine 20 mg, Gabapentin 300 mg    HPI:   Patient is a 57 y.o. male with past medical history significant for ADHD, depression who presents today for routine followup  PCP Dr Brigitte Pulse Last visit in July 2019 pmp reviewed  Overall doing well Depression affected by current grieving, having some melancholy Has done counseling thru hospice several years ago He is really involved in NA Writes as coping Has good family support Mother died a month ago, father died when he was 12, lost of loss though out his life Denies any SI Last visit prozac was increased to 40mg , feels that he was in a fog, now down back to 20mg , and fog lifted.   Worried about return of [place] on left arm in area were it has already been removed, new lesion present for past 3-4 months  Fall Risk  05/28/2018 03/28/2018 02/19/2018 12/11/2017 11/10/2017  Falls in the past year? No No No No No     Depression screen [facility] 2/9 05/28/2018 03/28/2018 02/19/2018  Decreased Interest 0 0 0  Down, Depressed, Hopeless 0 0 1  PHQ - 2 Score 0 0 1  Altered sleeping - - -  Tired, decreased energy - - -  Change in appetite - - -  Feeling bad or failure about yourself  - - -  Trouble concentrating - - -  Moving slowly or fidgety/restless - - -  Suicidal thoughts - - -  PHQ-9 Score - - -  Difficult doing work/chores - - -    Allergies  Allergen Reactions  . Hydrocodone     Throat swelling    Prior to Admission medications   Medication Sig Start Date End Date Taking? Authorizing Provider  acyclovir (ZOVIRAX) 400 MG tablet Take 1 tablet (400 mg total) by mouth 2 (two) times daily. 02/19/18  Yes Shawnee Knapp, MD  amphetamine-dextroamphetamine (ADDERALL) 15 MG tablet Take 1 tablet by mouth daily. 05/08/18  Yes Shawnee Knapp, MD  amphetamine-dextroamphetamine (ADDERALL) 15 MG  tablet Take 1 tablet by mouth daily. 04/10/18  Yes Shawnee Knapp, MD  amphetamine-dextroamphetamine (ADDERALL) 15 MG tablet Take 1 tablet by mouth daily. 03/13/18  Yes Shawnee Knapp, MD  FLUoxetine (PROZAC) 20 MG tablet Take 2 tablets (40 mg total) by mouth daily. 02/19/18  Yes Shawnee Knapp, MD  gabapentin (NEURONTIN) 300 MG capsule TAKE 1 CAPSULE TWICE A DAY AS NEEDED FOR ANXIETY/PAIN MANAGEMENT AND 2 CAPSULES AT BEDTIME 03/28/18  Yes Shawnee Knapp, MD  clonazePAM (KLONOPIN) 0.5 MG tablet Take 1 tablet (0.5 mg total) by mouth at bedtime. As needed for panic attacks or severe anxiety Patient not taking: Reported on 05/28/2018 02/19/18   Shawnee Knapp, MD    Past Medical History:  Diagnosis Date  . Depression     Past Surgical History:  Procedure Laterality Date  . GALLBLADDER SURGERY  2003    Social History   Tobacco Use  . Smoking status: Current Every Day Smoker    Packs/day: 3.00    Types: Cigarettes  . Smokeless tobacco: Never Used  Substance Use Topics  . Alcohol use: No    Family History  Problem Relation Age of Onset  . Cancer Mother   . Dementia Mother   . Diabetes Mother   . Heart Problems Mother   . Liver  cancer Father   . Colon cancer Father   . Alcohol abuse Maternal Grandfather   . Alcohol abuse Paternal Grandfather   . Colon polyps Sister   . Colon polyps Sister     ROS Per hpi  OBJECTIVE:  Blood pressure 132/72, pulse 88, temperature 98.1 F (36.7 C), temperature source Oral, resp. rate 16, height 6' (1.829 m), weight 183 lb 3.2 oz (83.1 kg), SpO2 97 %. Body mass index is 24.85 kg/m.   Wt Readings from Last 3 Encounters:  05/28/18 183 lb 3.2 oz (83.1 kg)  03/28/18 182 lb 6.4 oz (82.7 kg)  02/19/18 181 lb 9.6 oz (82.4 kg)   BP Readings from Last 3 Encounters:  05/28/18 132/72  03/28/18 95/60  02/19/18 (!) 92/58    Physical Exam  Constitutional: He is oriented to person, place, and time. He appears well-developed and well-nourished.  HENT:  Head:  Normocephalic and atraumatic.  Mouth/Throat: Oropharynx is clear and moist.  Eyes: Pupils are equal, round, and reactive to light. Conjunctivae and EOM are normal.  Neck: Neck supple.  Pulmonary/Chest: Effort normal.  Neurological: He is alert and oriented to person, place, and time.  Skin: Skin is warm and dry.  LUE just below AC fossa, a 58mm scaly ulcerated lesion  Psychiatric: He has a normal mood and affect.  Nursing note and vitals reviewed.   ASSESSMENT and PLAN  1. Attention deficit disorder (ADD) in adult Controlled. Continue current regime.  pmp reviewed  2. Moderate episode of recurrent major depressive disorder ([name]) Stable. Continue current meds.   3. Grief Coping well. Discussed resources, strategies.   4. Medication management - ToxASSURE Select 13 (MW), Urine  5. Basal cell carcinoma (BCC) of skin of left upper extremity including shoulder - Ambulatory referral to Dermatology  Other orders - amphetamine-dextroamphetamine (ADDERALL) 15 MG tablet; Take 1 tablet by mouth daily. - amphetamine-dextroamphetamine (ADDERALL) 15 MG tablet; Take 1 tablet by mouth daily. - amphetamine-dextroamphetamine (ADDERALL) 15 MG tablet; Take 1 tablet by mouth daily. - FLUoxetine (PROZAC) 20 MG tablet; Take 1 tablet (20 mg total) by mouth daily. - gabapentin (NEURONTIN) 300 MG capsule; Take 1 capsule (300 mg total) by mouth every morning AND 2 capsules (600 mg total) at bedtime.   Declines flu vaccine  Return in about 3 months (around 08/28/2018) for Dr Brigitte Pulse.    Rutherford Guys, MD [facility] at [facility], [place] [address] Ph.  [phone] Fax [phone]

## 2018-05-28 NOTE — Patient Instructions (Signed)
° ° ° °  If you have lab work done today you will be contacted with your lab results within the next 2 weeks.  If you have not heard from us then please contact us. The fastest way to get your results is to register for My Chart. ° ° °IF you received an x-ray today, you will receive an invoice from [facility]. Please contact [facility] at [phone] with questions or concerns regarding your invoice.  ° °IF you received labwork today, you will receive an invoice from [facility]. Please contact [facility] at [phone] with questions or concerns regarding your invoice.  ° °Our billing staff will not be able to assist you with questions regarding bills from these companies. ° °You will be contacted with the lab results as soon as they are available. The fastest way to get your results is to activate your My Chart account. Instructions are located on the last page of this paperwork. If you have not heard from us regarding the results in 2 weeks, please contact this office. °  ° ° ° °

## 2018-06-02 LAB — TOXASSURE SELECT 13 (MW), URINE

## 2018-06-07 ENCOUNTER — Other Ambulatory Visit: Payer: Self-pay

## 2018-06-07 ENCOUNTER — Encounter (HOSPITAL_COMMUNITY): Payer: Self-pay | Admitting: Emergency Medicine

## 2018-06-07 ENCOUNTER — Emergency Department (HOSPITAL_COMMUNITY)
Admission: EM | Admit: 2018-06-07 | Discharge: 2018-06-07 | Disposition: A | Discharge status: Home / Self Care | Payer: Self-pay | Attending: Emergency Medicine | Admitting: Emergency Medicine

## 2018-06-07 DIAGNOSIS — Y999 Unspecified external cause status: Secondary | ICD-10-CM | POA: Insufficient documentation

## 2018-06-07 DIAGNOSIS — Y9389 Activity, other specified: Secondary | ICD-10-CM | POA: Insufficient documentation

## 2018-06-07 DIAGNOSIS — Z23 Encounter for immunization: Secondary | ICD-10-CM | POA: Insufficient documentation

## 2018-06-07 DIAGNOSIS — S01112A Laceration without foreign body of left eyelid and periocular area, initial encounter: Secondary | ICD-10-CM | POA: Insufficient documentation

## 2018-06-07 DIAGNOSIS — Z79899 Other long term (current) drug therapy: Secondary | ICD-10-CM | POA: Insufficient documentation

## 2018-06-07 DIAGNOSIS — Y929 Unspecified place or not applicable: Secondary | ICD-10-CM | POA: Insufficient documentation

## 2018-06-07 DIAGNOSIS — F17210 Nicotine dependence, cigarettes, uncomplicated: Secondary | ICD-10-CM | POA: Insufficient documentation

## 2018-06-07 DIAGNOSIS — W208XXA Other cause of strike by thrown, projected or falling object, initial encounter: Secondary | ICD-10-CM | POA: Insufficient documentation

## 2018-06-07 DIAGNOSIS — S0181XA Laceration without foreign body of other part of head, initial encounter: Secondary | ICD-10-CM

## 2018-06-07 MED ORDER — LIDOCAINE-EPINEPHRINE (PF) 2 %-1:200000 IJ SOLN
10.0000 mL | Freq: Once | INTRAMUSCULAR | Status: AC
  Administered 2018-06-07: 10 mL
  Filled 2018-06-07: qty 10

## 2018-06-07 MED ORDER — TETANUS-DIPHTH-ACELL PERTUSSIS 5-2.5-18.5 LF-MCG/0.5 IM SUSP
0.5000 mL | Freq: Once | INTRAMUSCULAR | Status: AC
  Administered 2018-06-07: 0.5 mL via INTRAMUSCULAR
  Filled 2018-06-07: qty 0.5

## 2018-06-07 NOTE — Discharge Instructions (Signed)
Please read and follow all provided instructions.  Your diagnoses today include:  1. Facial laceration, initial encounter    Tests performed today include:  Vital signs. See below for your results today.   Medications prescribed:   None  Take any prescribed medications only as directed.   Home care instructions:  Follow any educational materials and wound care instructions contained in this packet.   Keep affected area above the level of your heart when possible to minimize swelling. Wash area gently twice a day with warm soapy water. Do not apply alcohol or hydrogen peroxide. Cover the area if it draining or weeping.   Follow-up instructions: Suture Removal: Return to the Emergency Department or see your primary care care doctor in 5 days for a recheck of your wound and removal of your sutures or staples.    Return instructions:  Return to the Emergency Department if you have:  Fever  Worsening pain  Worsening swelling of the wound  Pus draining from the wound  Redness of the skin that moves away from the wound, especially if it streaks away from the affected area   Any other emergent concerns  Your vital signs today were: BP 120/82    Pulse 67    Temp 97.7 F (36.5 C) (Oral)    Resp 10    Ht 6' (1.829 m)    Wt 84 kg    SpO2 98%    BMI 25.12 kg/m  If your blood pressure (BP) was elevated above 135/85 this visit, please have this repeated by your doctor within one month. --------------

## 2018-06-07 NOTE — ED Triage Notes (Signed)
Pt states he was working in his shop when a piece of equipment fell, knocking him to the ground.  Pt presents with a laceration above left eye.  Bleeding controlled.

## 2018-06-07 NOTE — ED Notes (Signed)
Patient verbalizes understanding of discharge instructions. Opportunity for questioning and answers were provided. Armband removed by staff, pt discharged from ED home via POV.  

## 2018-06-07 NOTE — ED Notes (Signed)
ED Provider at bedside. 

## 2018-06-07 NOTE — ED Provider Notes (Signed)
[facility] Provider Note   CSN: [id number] Arrival date & time: 06/07/18  1818     History   Chief Complaint Chief Complaint  Patient presents with  . Laceration    HPI Louis Zimmerman is a 57 y.o. male.  Patient presents to the hospital with complaint of left eyebrow laceration starting acutely approximately with 30 minutes prior to arrival.  Patient was working in his shop when a piece of equipment fell from a cabinet and struck him above the left eye.  Patient was dazed and fell to the ground.  He did not lose consciousness.  He applied a wet paper towel to the area to help stop the bleeding.  Since that time he has had a headache but no vision change or vomiting.  He denies any neck pain.  No weakness, numbness, or tingling in his arms or his legs.  He is able to ambulate without difficulty.  Last tetanus shot was 2014.  He denies any other injuries except for some minor pain to his right elbow area.  He does not take anticoagulants.     Past Medical History:  Diagnosis Date  . Depression     Patient Active Problem List   Diagnosis Date Noted  . 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 Attention deficit disorder (ADD) in adult 11/10/2017  . Moderate episode of recurrent major depressive disorder ([place]) 08/31/2017  . 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 Disturbed concentration 08/31/2017  . Tobacco use disorder 08/31/2017  . History of substance abuse ([place]) 02/23/2017  . Depression 02/02/2013    Past Surgical History:  Procedure Laterality Date  . GALLBLADDER SURGERY  2003        Home Medications    Prior to Admission medications   Medication Sig Start Date End Date Taking? Authorizing Provider  acyclovir (ZOVIRAX) 400 MG tablet Take 1 tablet (400 mg total) by mouth 2 (two) times daily. 02/19/18   Shawnee Knapp, MD  amphetamine-dextroamphetamine (ADDERALL) 15 MG tablet Take 1 tablet by mouth daily. 07/28/18   Rutherford Guys, MD  amphetamine-dextroamphetamine (ADDERALL) 15 MG tablet Take 1 tablet by mouth daily. 06/27/18   Rutherford Guys, MD  amphetamine-dextroamphetamine (ADDERALL) 15 MG tablet Take 1 tablet by mouth daily. 05/28/18   Rutherford Guys, MD  FLUoxetine (PROZAC) 20 MG tablet Take 1 tablet (20 mg total) by mouth daily. 05/28/18   Rutherford Guys, MD  gabapentin (NEURONTIN) 300 MG capsule Take 1 capsule (300 mg total) by mouth every morning AND 2 capsules (600 mg total) at bedtime. 05/28/18   Rutherford Guys, MD    Family History Family History  Problem Relation Age of Onset  . Cancer Mother   . Dementia Mother   . Diabetes Mother   . Heart Problems Mother   . Liver cancer Father   . Colon cancer Father   . Alcohol abuse Maternal Grandfather   . Alcohol abuse Paternal Grandfather   . Colon polyps Sister   . Colon polyps Sister     Social History Social History   Tobacco Use  . Smoking status: Current Every Day Smoker    Packs/day: 3.00    Types: Cigarettes  . Smokeless tobacco: Never Used  Substance Use Topics  . Alcohol use: No  . Drug use: No    Frequency: 7.0 times per week    Types: IV     Allergies   Hydrocodone   Review of Systems Review  of Systems  Constitutional: Negative for fatigue.  HENT: Negative for tinnitus.   Eyes: Negative for photophobia, pain and visual disturbance.  Respiratory: Negative for shortness of breath.   Cardiovascular: Negative for chest pain.  Gastrointestinal: Negative for nausea and vomiting.  Musculoskeletal: Negative for back pain, gait problem and neck pain.  Skin: Positive for wound.  Neurological: Negative for dizziness, weakness, light-headedness, numbness and headaches.  Psychiatric/Behavioral: Negative for confusion and decreased concentration.     Physical Exam Updated Vital Signs BP 108/83   Pulse 93   Temp 97.7 F (36.5 C) (Oral)   Resp 11   Ht 6' (1.829 m)   Wt 84 kg   SpO2 97%   BMI 25.12 kg/m    Physical Exam  Constitutional: He is oriented to person, place, and time. He appears well-developed and well-nourished.  HENT:  Head: Normocephalic. Head is without raccoon's eyes and without Battle's sign.  Right Ear: Tympanic membrane, external ear and ear canal normal. No hemotympanum.  Left Ear: Tympanic membrane, external ear and ear canal normal. No hemotympanum.  Nose: Nose normal. No nasal septal hematoma.  Mouth/Throat: Oropharynx is clear and moist.  3 cm laceration, with mild oozing noted above the left eyebrow.  Slight arteriole bleeding controlled with lido with epi.  Wound does penetrate through the subcutaneous tissue.  Minor muscle involvement noted.    Eyes: Pupils are equal, round, and reactive to light. Conjunctivae, EOM and lids are normal.  No visible hyphema  Neck: Normal range of motion. Neck supple.  Cardiovascular: Normal rate and regular rhythm.  Pulmonary/Chest: Effort normal and breath sounds normal.  Abdominal: Soft. There is no tenderness.  Musculoskeletal: Normal range of motion.       Cervical back: He exhibits normal range of motion, no tenderness and no bony tenderness.       Thoracic back: He exhibits no tenderness and no bony tenderness.       Lumbar back: He exhibits no tenderness and no bony tenderness.  Patient is able to raise his eyebrows and blink without any apparent deficits.  Symmetry maintained.   Neurological: He is alert and oriented to person, place, and time. He has normal strength and normal reflexes. No cranial nerve deficit or sensory deficit. Coordination normal. GCS eye subscore is 4. GCS verbal subscore is 5. GCS motor subscore is 6.  No significant numbness or paresthesias in the area around the cut.  Skin: Skin is warm and dry.  Psychiatric: He has a normal mood and affect.  Nursing note and vitals reviewed.    ED Treatments / Results  Labs (all labs ordered are listed, but only abnormal results are displayed) Labs Reviewed  - No data to display  EKG None  Radiology No results found.  Procedures .Marland KitchenLaceration Repair Date/Time: 06/07/2018 7:41 PM Performed by: Carlisle Cater, PA-C Authorized by: Carlisle Cater, PA-C   Consent:    Consent obtained:  Verbal   Consent given by:  Patient   Risks discussed:  Infection, need for additional repair, nerve damage, poor cosmetic result, pain and vascular damage   Alternatives discussed:  No treatment Anesthesia (see MAR for exact dosages):    Anesthesia method:  Local infiltration   Local anesthetic:  Lidocaine 2% WITH epi Laceration details:    Location:  Face   Face location:  L eyebrow   Length (cm):  3 Repair type:    Repair type:  Simple Pre-procedure details:    Preparation:  Patient was prepped and draped  in usual sterile fashion Exploration:    Hemostasis achieved with:  Epinephrine and direct pressure   Wound exploration: wound explored through full range of motion and entire depth of wound probed and visualized     Wound extent: muscle damage     Contaminated: no   Treatment:    Area cleansed with:  Hibiclens   Amount of cleaning:  Extensive Skin repair:    Repair method:  Sutures   Suture size:  5-0   Suture material:  Nylon   Suture technique:  Simple interrupted   Number of sutures:  5 Approximation:    Approximation:  Close Post-procedure details:    Patient tolerance of procedure:  Tolerated well, no immediate complications   (including critical care time)  Medications Ordered in ED Medications  Tdap (BOOSTRIX) injection 0.5 mL (0.5 mLs Intramuscular Given 06/07/18 1834)  lidocaine-EPINEPHrine (XYLOCAINE W/EPI) 2 %-1:200000 (PF) injection 10 mL (10 mLs Infiltration Given by Other 06/07/18 1842)     Initial Impression / Assessment and Plan / ED Course  I have reviewed the triage vital signs and the nursing notes.  Pertinent labs & imaging results that were available during my care of the patient were reviewed by me and  considered in my medical decision making (see chart for details).     Patient seen and examined. Work-up initiated. Medications ordered. Seen with Dr. Jeanell Sparrow.   Vital signs reviewed and are as follows: BP 108/83   Pulse 93   Temp 97.7 F (36.5 C) (Oral)   Resp 11   Ht 6' (1.829 m)   Wt 84 kg   SpO2 97%   BMI 25.12 kg/m   Wound repaired as above. PA-S2 Nyoka Cowden assisted.  Wound was well approximated after cleaning and exploration.  No change in mental status or neuro exam during ED stay.  Patient counseled on wound care. Patient counseled on need to return or see PCP/urgent care for suture removal in 5 days. Patient was urged to return to the Emergency Department urgently with worsening pain, swelling, expanding erythema especially if it streaks away from the affected area, fever, or if they have any other concerns. Patient verbalized understanding.   Patient was counseled on head injury precautions and symptoms that should indicate their return to the ED.  These include severe worsening headache, vision changes, confusion, loss of consciousness, trouble walking, nausea & vomiting, or weakness/tingling in extremities.     Final Clinical Impressions(s) / ED Diagnoses   Final diagnoses:  Facial laceration, initial encounter   Patient with facial laceration and minor head injury.  Eyebrow laceration repaired per above with good reapproximation.  No significant nerve or vascular injury suspected.  Patient with good range of motion of forehead.  No visual changes or globe involvement suspected.  No point tenderness over the face to suspect facial fracture.  Patient with a normal neurological exam.  Negative [place] head CT rules.  No anticoagulation.  No change in mental status during ED stay.  Discussed signs and symptoms to return and follow-up as above.  ED Discharge Orders    None       Carlisle Cater, Hershal Coria 06/07/18 1945    Pattricia Boss, MD 06/07/18 [phone]

## 2018-06-13 ENCOUNTER — Ambulatory Visit (HOSPITAL_COMMUNITY)
Admission: EM | Admit: 2018-06-13 | Discharge: 2018-06-13 | Disposition: A | Discharge status: Home / Self Care | Payer: Self-pay | Attending: Family Medicine | Admitting: Family Medicine

## 2018-06-13 DIAGNOSIS — S0181XA Laceration without foreign body of other part of head, initial encounter: Secondary | ICD-10-CM

## 2018-06-13 DIAGNOSIS — Z4802 Encounter for removal of sutures: Secondary | ICD-10-CM

## 2018-06-13 NOTE — ED Notes (Signed)
Pt had sutures removed from his left eyebrow. No redness and swelling.

## 2018-06-18 ENCOUNTER — Telehealth: Payer: Self-pay | Admitting: Family Medicine

## 2018-06-18 NOTE — Telephone Encounter (Signed)
LVM for pt to call the office and reschedule their app that is scheduled for 09/07/18. Due to providers schedule change, Dr. Brigitte Pulse will be unavailable that day. When pt calls back, please reschedule at their convenience. Thank you!

## 2018-06-25 ENCOUNTER — Other Ambulatory Visit: Payer: Self-pay

## 2018-06-25 ENCOUNTER — Observation Stay (HOSPITAL_COMMUNITY)
Admission: EM | Admit: 2018-06-25 | Discharge: 2018-06-26 | Disposition: A | Discharge status: Home / Self Care | Payer: Self-pay | Attending: Family Medicine | Admitting: Family Medicine

## 2018-06-25 ENCOUNTER — Encounter (HOSPITAL_COMMUNITY): Payer: Self-pay

## 2018-06-25 ENCOUNTER — Emergency Department (HOSPITAL_COMMUNITY): Payer: Self-pay

## 2018-06-25 DIAGNOSIS — F17210 Nicotine dependence, cigarettes, uncomplicated: Secondary | ICD-10-CM | POA: Insufficient documentation

## 2018-06-25 DIAGNOSIS — Z9049 Acquired absence of other specified parts of digestive tract: Secondary | ICD-10-CM | POA: Insufficient documentation

## 2018-06-25 DIAGNOSIS — R0602 Shortness of breath: Secondary | ICD-10-CM | POA: Insufficient documentation

## 2018-06-25 DIAGNOSIS — Z885 Allergy status to narcotic agent status: Secondary | ICD-10-CM | POA: Insufficient documentation

## 2018-06-25 DIAGNOSIS — Z79899 Other long term (current) drug therapy: Secondary | ICD-10-CM | POA: Insufficient documentation

## 2018-06-25 DIAGNOSIS — M47816 Spondylosis without myelopathy or radiculopathy, lumbar region: Secondary | ICD-10-CM | POA: Insufficient documentation

## 2018-06-25 DIAGNOSIS — F329 Major depressive disorder, single episode, unspecified: Secondary | ICD-10-CM | POA: Insufficient documentation

## 2018-06-25 DIAGNOSIS — N179 Acute kidney failure, unspecified: Secondary | ICD-10-CM | POA: Diagnosis present

## 2018-06-25 DIAGNOSIS — R200 Anesthesia of skin: Secondary | ICD-10-CM | POA: Insufficient documentation

## 2018-06-25 DIAGNOSIS — J439 Emphysema, unspecified: Secondary | ICD-10-CM | POA: Insufficient documentation

## 2018-06-25 DIAGNOSIS — Z85828 Personal history of other malignant neoplasm of skin: Secondary | ICD-10-CM | POA: Insufficient documentation

## 2018-06-25 DIAGNOSIS — I776 Arteritis, unspecified: Principal | ICD-10-CM

## 2018-06-25 DIAGNOSIS — R079 Chest pain, unspecified: Secondary | ICD-10-CM | POA: Insufficient documentation

## 2018-06-25 DIAGNOSIS — Z7982 Long term (current) use of aspirin: Secondary | ICD-10-CM | POA: Insufficient documentation

## 2018-06-25 LAB — CBC WITH DIFFERENTIAL/PLATELET
Abs Immature Granulocytes: 0.04 10*3/uL (ref 0.00–0.07)
BASOS PCT: 1 %
Basophils Absolute: 0.1 10*3/uL (ref 0.0–0.1)
Eosinophils Absolute: 0.4 10*3/uL (ref 0.0–0.5)
Eosinophils Relative: 4 %
HCT: 42.5 % (ref 39.0–52.0)
Hemoglobin: 14.2 g/dL (ref 13.0–17.0)
Immature Granulocytes: 0 %
Lymphocytes Relative: 35 %
Lymphs Abs: 3.6 10*3/uL (ref 0.7–4.0)
MCH: 30.2 pg (ref 26.0–34.0)
MCHC: 33.4 g/dL (ref 30.0–36.0)
MCV: 90.4 fL (ref 80.0–100.0)
Monocytes Absolute: 0.7 10*3/uL (ref 0.1–1.0)
Monocytes Relative: 7 %
Neutro Abs: 5.4 10*3/uL (ref 1.7–7.7)
Neutrophils Relative %: 53 %
PLATELETS: 337 10*3/uL (ref 150–400)
RBC: 4.7 MIL/uL (ref 4.22–5.81)
RDW: 13.9 % (ref 11.5–15.5)
WBC: 10.1 10*3/uL (ref 4.0–10.5)
nRBC: 0 % (ref 0.0–0.2)

## 2018-06-25 LAB — LIPASE, BLOOD: Lipase: 40 U/L (ref 11–51)

## 2018-06-25 LAB — COMPREHENSIVE METABOLIC PANEL
ALT: 29 U/L (ref 0–44)
AST: 36 U/L (ref 15–41)
Albumin: 3.5 g/dL (ref 3.5–5.0)
Alkaline Phosphatase: 84 U/L (ref 38–126)
Anion gap: 11 (ref 5–15)
BUN: 9 mg/dL (ref 6–20)
CHLORIDE: 103 mmol/L (ref 98–111)
CO2: 22 mmol/L (ref 22–32)
Calcium: 9 mg/dL (ref 8.9–10.3)
Creatinine, Ser: 1.42 mg/dL — ABNORMAL HIGH (ref 0.61–1.24)
GFR calc Af Amer: >60 mL/min (ref >60)
GFR calc non Af Amer: 55 mL/min — ABNORMAL LOW (ref >60)
Glucose, Bld: 105 mg/dL — ABNORMAL HIGH (ref 70–99)
POTASSIUM: 4 mmol/L (ref 3.5–5.1)
Sodium: 136 mmol/L (ref 135–145)
Total Bilirubin: 1 mg/dL (ref 0.3–1.2)
Total Protein: 6 g/dL — ABNORMAL LOW (ref 6.5–8.1)

## 2018-06-25 LAB — URINALYSIS, ROUTINE W REFLEX MICROSCOPIC
Bilirubin Urine: NEGATIVE
Glucose, UA: NEGATIVE mg/dL
Hgb urine dipstick: NEGATIVE
Ketones, ur: NEGATIVE mg/dL
Leukocytes, UA: NEGATIVE
Nitrite: NEGATIVE
Protein, ur: NEGATIVE mg/dL
Specific Gravity, Urine: 1.016 (ref 1.005–1.030)
pH: 6 (ref 5.0–8.0)

## 2018-06-25 MED ORDER — MORPHINE SULFATE (PF) 4 MG/ML IV SOLN
4.0000 mg | Freq: Once | INTRAVENOUS | Status: AC
  Administered 2018-06-25: 4 mg via INTRAVENOUS
  Filled 2018-06-25: qty 1

## 2018-06-25 MED ORDER — ONDANSETRON HCL 4 MG/2ML IJ SOLN
4.0000 mg | Freq: Once | INTRAMUSCULAR | Status: AC
  Administered 2018-06-25: 4 mg via INTRAVENOUS
  Filled 2018-06-25: qty 2

## 2018-06-25 MED ORDER — IOPAMIDOL (ISOVUE-370) INJECTION 76%
INTRAVENOUS | Status: AC
  Administered 2018-06-25: 100 mL
  Filled 2018-06-25: qty 100

## 2018-06-25 NOTE — ED Notes (Signed)
Patient transported to CT 

## 2018-06-25 NOTE — ED Provider Notes (Signed)
[facility] EMERGENCY DEPARTMENT Provider Note   CSN: [id number] Arrival date & time: 06/25/18  2207     History   Chief Complaint Chief Complaint  Patient presents with  . Abdominal Pain  . Back Pain    HPI Louis Zimmerman is a 57 y.o. male.  Patient presents to the emergency department with a chief complaint of left-sided flank pain that radiates to his lower abdomen.  He states the symptoms started suddenly about 4 hours ago.  Rates pain as a 7 or 8 out of 10.  Denies any fevers or chills.  He reports some mild nausea, but denies any vomiting, diarrhea, or constipation.  Denies any dysuria or hematuria.  He reports having had prior cholecystectomy.  Denies any prior kidney stones.  Denies any treatments prior to arrival.  The history is provided by the patient. No language interpreter was used.    Past Medical History:  Diagnosis Date  . Depression     Patient Active Problem List   Diagnosis Date Noted  . 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 Attention deficit disorder (ADD) in adult 11/10/2017  . Moderate episode of recurrent major depressive disorder ([place]) 08/31/2017  . 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 Disturbed concentration 08/31/2017  . Tobacco use disorder 08/31/2017  . History of substance abuse ([place]) 02/23/2017  . Depression 02/02/2013    Past Surgical History:  Procedure Laterality Date  . CHOLECYSTECTOMY    . GALLBLADDER SURGERY  2003        Home Medications    Prior to Admission medications   Medication Sig Start Date End Date Taking? Authorizing Provider  acyclovir (ZOVIRAX) 400 MG tablet Take 1 tablet (400 mg total) by mouth 2 (two) times daily. 02/19/18   Shawnee Knapp, MD  amphetamine-dextroamphetamine (ADDERALL) 15 MG tablet Take 1 tablet by mouth daily. 07/28/18   Rutherford Guys, MD  amphetamine-dextroamphetamine (ADDERALL) 15 MG tablet Take 1 tablet by mouth daily. 06/27/18   Rutherford Guys, MD    amphetamine-dextroamphetamine (ADDERALL) 15 MG tablet Take 1 tablet by mouth daily. 05/28/18   Rutherford Guys, MD  FLUoxetine (PROZAC) 20 MG tablet Take 1 tablet (20 mg total) by mouth daily. 05/28/18   Rutherford Guys, MD  gabapentin (NEURONTIN) 300 MG capsule Take 1 capsule (300 mg total) by mouth every morning AND 2 capsules (600 mg total) at bedtime. 05/28/18   Rutherford Guys, MD    Family History Family History  Problem Relation Age of Onset  . Cancer Mother   . Dementia Mother   . Diabetes Mother   . Heart Problems Mother   . Liver cancer Father   . Colon cancer Father   . Alcohol abuse Maternal Grandfather   . Alcohol abuse Paternal Grandfather   . Colon polyps Sister   . Colon polyps Sister     Social History Social History   Tobacco Use  . Smoking status: Current Every Day Smoker    Packs/day: 3.00    Types: Cigarettes  . Smokeless tobacco: Never Used  Substance Use Topics  . Alcohol use: No  . Drug use: No    Frequency: 7.0 times per week    Types: IV     Allergies   Hydrocodone   Review of Systems Review of Systems  All other systems reviewed and are negative.    Physical Exam Updated Vital Signs BP (!) 154/85 (BP Location: Right Arm)   Pulse 67   Temp  97.9 F (36.6 C) (Oral)   Resp 18   Ht 6' (1.829 m)   Wt 84 kg   SpO2 98%   BMI 25.12 kg/m   Physical Exam  Constitutional: He is oriented to person, place, and time. He appears well-developed and well-nourished.  HENT:  Head: Normocephalic and atraumatic.  Eyes: Pupils are equal, round, and reactive to light. Conjunctivae and EOM are normal. Right eye exhibits no discharge. Left eye exhibits no discharge. No scleral icterus.  Neck: Normal range of motion. Neck supple. No JVD present.  Cardiovascular: Normal rate, regular rhythm and normal heart sounds. Exam reveals no gallop and no friction rub.  No murmur heard. Pulmonary/Chest: Effort normal and breath sounds normal. No respiratory  distress. He has no wheezes. He has no rales. He exhibits no tenderness.  Abdominal: Soft. He exhibits no distension and no mass. There is no tenderness. There is no rebound and no guarding.  Musculoskeletal: Normal range of motion. He exhibits no edema or tenderness.  Neurological: He is alert and oriented to person, place, and time.  Skin: Skin is warm and dry.  Psychiatric: He has a normal mood and affect. His behavior is normal. Judgment and thought content normal.  Nursing note and vitals reviewed.    ED Treatments / Results  Labs (all labs ordered are listed, but only abnormal results are displayed) Labs Reviewed  COMPREHENSIVE METABOLIC PANEL - Abnormal; Notable for the following components:      Result Value   Glucose, Bld 105 (*)    Creatinine, Ser 1.42 (*)    Total Protein 6.0 (*)    GFR calc non Af Amer 55 (*)    All other components within normal limits  CULTURE, BLOOD (ROUTINE X 2)  CULTURE, BLOOD (ROUTINE X 2)  CBC WITH DIFFERENTIAL/PLATELET  LIPASE, BLOOD  URINALYSIS, ROUTINE W REFLEX MICROSCOPIC  SEDIMENTATION RATE  C-REACTIVE PROTEIN  ANA W/REFLEX IF POSITIVE  ANCA TITERS  MPO/PR-3 (ANCA) ANTIBODIES  LACTIC ACID, PLASMA  LACTIC ACID, PLASMA  CREATININE, URINE, RANDOM  SODIUM, URINE, RANDOM  CRYOGLOBULIN  COMPLEMENT, TOTAL  C4 COMPLEMENT    EKG None  Radiology Ct Renal Stone Study  Result Date: 06/25/2018 CLINICAL DATA:  Mid abdominal cramping pain EXAM: CT ABDOMEN AND PELVIS WITHOUT CONTRAST TECHNIQUE: Multidetector CT imaging of the abdomen and pelvis was performed following the standard protocol without IV contrast. COMPARISON:  Ultrasound 09/04/2004 FINDINGS: Lower chest: No acute abnormality. Hepatobiliary: No focal liver abnormality is seen. Status post cholecystectomy. No biliary dilatation. Pancreas: Unremarkable. No pancreatic ductal dilatation or surrounding inflammatory changes. Spleen: Normal in size without focal abnormality.  Adrenals/Urinary Tract: Adrenal glands are unremarkable. Kidneys are normal, without renal calculi, focal lesion, or hydronephrosis. Bladder is unremarkable. Stomach/Bowel: Stomach is within normal limits. Appendix appears normal. No evidence of bowel wall thickening, distention, or inflammatory changes. Vascular/Lymphatic: Mild aortic atherosclerosis. Mild edema and hazy indistinct appearance surrounding the origins of the celiac and superior mesenteric arteries. No definite evidence for intramural hematoma. No significantly enlarged lymph nodes. Reproductive: Prostate is unremarkable. Other: No free air or free fluid. Musculoskeletal: Degenerative changes. No acute or suspicious abnormality IMPRESSION: 1. Negative for hydronephrosis or ureteral stone. 2. Edema and indistinct hazy density around the proximal celiac and SMA arteries, further evaluation limited due to absence of contrast. Differential considerations include vasculitis versus possible thrombosis or dissection. Recommend CT angiography for further evaluation. Electronically Signed   By: Donavan Foil M.D.   On: 06/25/2018 23:14    Procedures Procedures (  including critical care time)  Medications Ordered in ED Medications  morphine 4 MG/ML injection 4 mg (has no administration in time range)  ondansetron (ZOFRAN) injection 4 mg (has no administration in time range)     Initial Impression / Assessment and Plan / ED Course  I have reviewed the triage vital signs and the nursing notes.  Pertinent labs & imaging results that were available during my care of the patient were reviewed by me and considered in my medical decision making (see chart for details).    Patient with sudden onset sharp left-sided flank pain that radiates to his groin.  Appears uncomfortable.  Concern for kidney stone.  Will check CT renal study.  CT shows some mild haziness or edema on the proximal celiac and SMA arteries concerning for thrombosis or dissection.   Will proceed with CT angiography for further clarification.  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  Will consult medicine for admission.  Discussed case with Dr. Roel Cluck, who recommends consultation with vascular surgery.  I discussed the case with Dr. Donzetta Matters from vascular surgery, who recommends steroid, aspirin, and clear liquid diet.  Nonsurgical at this point given no blockage of the vessels.  Recommends checking inflammatory markers.  Dr. Donzetta Matters will see the patient in the morning.  Final Clinical Impressions(s) / ED Diagnoses   Final diagnoses:  Vasculitis [facility])    ED Discharge Orders    None       Montine Circle, PA-C 06/26/18 0141    Malvin Johns, MD 06/26/18 1534

## 2018-06-25 NOTE — ED Triage Notes (Signed)
Pt reports mid abd cramping that radiates to his back that started about 4 hours ago and continues to get worse. Pt denies n/v. Last BM was this morning. Pt a.o, nad noted

## 2018-06-26 ENCOUNTER — Inpatient Hospital Stay (HOSPITAL_COMMUNITY): Payer: Self-pay

## 2018-06-26 ENCOUNTER — Inpatient Hospital Stay (HOSPITAL_BASED_OUTPATIENT_CLINIC_OR_DEPARTMENT_OTHER): Payer: Self-pay

## 2018-06-26 DIAGNOSIS — N179 Acute kidney failure, unspecified: Secondary | ICD-10-CM | POA: Diagnosis present

## 2018-06-26 DIAGNOSIS — Z720 Tobacco use: Secondary | ICD-10-CM

## 2018-06-26 DIAGNOSIS — I779 Disorder of arteries and arterioles, unspecified: Secondary | ICD-10-CM

## 2018-06-26 DIAGNOSIS — I776 Arteritis, unspecified: Secondary | ICD-10-CM

## 2018-06-26 DIAGNOSIS — R109 Unspecified abdominal pain: Secondary | ICD-10-CM

## 2018-06-26 LAB — PHOSPHORUS: PHOSPHORUS: 2.6 mg/dL (ref 2.5–4.6)

## 2018-06-26 LAB — COMPREHENSIVE METABOLIC PANEL
ALT: 29 U/L (ref 0–44)
ANION GAP: 5 (ref 5–15)
AST: 25 U/L (ref 15–41)
Albumin: 3.8 g/dL (ref 3.5–5.0)
Alkaline Phosphatase: 87 U/L (ref 38–126)
BUN: 10 mg/dL (ref 6–20)
CO2: 28 mmol/L (ref 22–32)
Calcium: 9.3 mg/dL (ref 8.9–10.3)
Chloride: 101 mmol/L (ref 98–111)
Creatinine, Ser: 1.35 mg/dL — ABNORMAL HIGH (ref 0.61–1.24)
GFR calc Af Amer: >60 mL/min (ref >60)
GFR calc non Af Amer: 58 mL/min — ABNORMAL LOW (ref >60)
Glucose, Bld: 105 mg/dL — ABNORMAL HIGH (ref 70–99)
POTASSIUM: 4.3 mmol/L (ref 3.5–5.1)
Sodium: 134 mmol/L — ABNORMAL LOW (ref 135–145)
Total Bilirubin: 0.4 mg/dL (ref 0.3–1.2)
Total Protein: 6.6 g/dL (ref 6.5–8.1)

## 2018-06-26 LAB — CBC
HCT: 45.3 % (ref 39.0–52.0)
Hemoglobin: 14.6 g/dL (ref 13.0–17.0)
MCH: 29.4 pg (ref 26.0–34.0)
MCHC: 32.2 g/dL (ref 30.0–36.0)
MCV: 91.3 fL (ref 80.0–100.0)
PLATELETS: 384 10*3/uL (ref 150–400)
RBC: 4.96 MIL/uL (ref 4.22–5.81)
RDW: 14.2 % (ref 11.5–15.5)
WBC: 8.6 10*3/uL (ref 4.0–10.5)
nRBC: 0 % (ref 0.0–0.2)

## 2018-06-26 LAB — MAGNESIUM: Magnesium: 2.3 mg/dL (ref 1.7–2.4)

## 2018-06-26 LAB — ECHOCARDIOGRAM COMPLETE
Height: 73 in
Weight: 2913.6 oz

## 2018-06-26 LAB — HIV ANTIBODY (ROUTINE TESTING W REFLEX): HIV Screen 4th Generation wRfx: NONREACTIVE

## 2018-06-26 LAB — LACTIC ACID, PLASMA: Lactic Acid, Venous: 1.4 mmol/L (ref 0.5–1.9)

## 2018-06-26 LAB — CREATININE, URINE, RANDOM: Creatinine, Urine: 143.13 mg/dL

## 2018-06-26 LAB — C-REACTIVE PROTEIN

## 2018-06-26 LAB — SEDIMENTATION RATE: SED RATE: 6 mm/h (ref 0–16)

## 2018-06-26 LAB — TSH: TSH: 5.068 u[IU]/mL — ABNORMAL HIGH (ref 0.350–4.500)

## 2018-06-26 LAB — SODIUM, URINE, RANDOM: Sodium, Ur: 107 mmol/L

## 2018-06-26 MED ORDER — HYDROMORPHONE HCL 1 MG/ML IJ SOLN
1.0000 mg | Freq: Once | INTRAMUSCULAR | Status: AC
  Administered 2018-06-26: 1 mg via INTRAVENOUS
  Filled 2018-06-26: qty 1

## 2018-06-26 MED ORDER — ONDANSETRON HCL 4 MG PO TABS: 4.0000 mg | ORAL_TABLET | Freq: Four times a day (QID) | ORAL | Status: DC | PRN

## 2018-06-26 MED ORDER — METHYLPREDNISOLONE SODIUM SUCC 125 MG IJ SOLR
125.0000 mg | Freq: Once | INTRAMUSCULAR | Status: AC
  Administered 2018-06-26: 125 mg via INTRAVENOUS
  Filled 2018-06-26: qty 2

## 2018-06-26 MED ORDER — NICOTINE 21 MG/24HR TD PT24
21.0000 mg | MEDICATED_PATCH | Freq: Every day | TRANSDERMAL | Status: DC
  Administered 2018-06-26: 21 mg via TRANSDERMAL
  Filled 2018-06-26: qty 1

## 2018-06-26 MED ORDER — ASPIRIN 325 MG PO TABS
325.0000 mg | ORAL_TABLET | Freq: Every day | ORAL | Status: DC
  Administered 2018-06-26: 325 mg via ORAL
  Filled 2018-06-26: qty 1

## 2018-06-26 MED ORDER — METHYLPREDNISOLONE SODIUM SUCC 125 MG IJ SOLR
60.0000 mg | Freq: Two times a day (BID) | INTRAMUSCULAR | Status: DC
  Administered 2018-06-26: 60 mg via INTRAVENOUS
  Filled 2018-06-26 (×2): qty 2

## 2018-06-26 MED ORDER — ONDANSETRON HCL 4 MG/2ML IJ SOLN: 4.0000 mg | Freq: Four times a day (QID) | INTRAMUSCULAR | Status: DC | PRN

## 2018-06-26 MED ORDER — ACETAMINOPHEN 325 MG PO TABS: 650.0000 mg | ORAL_TABLET | Freq: Four times a day (QID) | ORAL | Status: DC | PRN

## 2018-06-26 MED ORDER — SODIUM CHLORIDE 0.9 % IV SOLN
INTRAVENOUS | Status: AC
  Administered 2018-06-26: 03:00:00 via INTRAVENOUS

## 2018-06-26 MED ORDER — ASPIRIN EC 81 MG PO TBEC: 81.0000 mg | DELAYED_RELEASE_TABLET | Freq: Every day | ORAL | 2 refills | Status: DC

## 2018-06-26 MED ORDER — GABAPENTIN 300 MG PO CAPS: 600.0000 mg | ORAL_CAPSULE | Freq: Every day | ORAL | Status: DC

## 2018-06-26 MED ORDER — ACETAMINOPHEN 650 MG RE SUPP: 650.0000 mg | Freq: Four times a day (QID) | RECTAL | Status: DC | PRN

## 2018-06-26 NOTE — H&P (Addendum)
Louis Zimmerman JOA:[id number] DOB: 07/08/1961 DOA: 06/25/2018     PCP: Shawnee Knapp, MD   Outpatient Specialists:   NONE    Patient arrived to ER on 06/25/18 at 2207  Patient coming from: home Lives alone,      Chief Complaint:  Chief Complaint  Patient presents with  . Abdominal Pain  . Back Pain    HPI: Louis Zimmerman is a 57 y.o. male with medical history significant of attention deficit disorder, depression, tobacco abuse    Presented with significant abdominal cramping radiating to his back started today no associated nausea vomiting had a normal bowel movement this morning. Describes pain as left-sided flank pain radiating to lower abdomen pain is severe 8 out of 10 no fevers associated with this no dysuria hematuria prior history of kidney stone he has had   Cholecystectomy  He denies any fever or chills reports he have had sinus problems and cough for the past few weeks,  No rushes, no epistaxis, no hemoptysis, no weight loss,   Had basal cell carcinoma taken off few months ago. No Dark urine, no blood in urine no foamy urine Reports occasional numbness of both arms   While in ER: CT renal protocol could not rule out possible SMA dissection CTA was done showed perivascular haziness about the proximal SMA and celiac artery suspicious for vasculitis but no dissection and vessels are patent  The following Work up has been ordered so far:  Orders Placed This Encounter  Procedures  . CT Renal Stone Study  . CT Angio Chest/Abd/Pel for Dissection W and/or W/WO  . CBC with Differential/Platelet  . Comprehensive metabolic panel  . Lipase, blood  . Urinalysis, Routine w reflex microscopic  . Consult to hospitalist   Following Medications were ordered in ER: Medications  methylPREDNISolone sodium succinate (SOLU-MEDROL) 125 mg/2 mL injection 125 mg (has no administration in time range)  morphine 4 MG/ML injection 4 mg (4 mg Intravenous Given 06/25/18 2238)    ondansetron (ZOFRAN) injection 4 mg (4 mg Intravenous Given 06/25/18 2236)  iopamidol (ISOVUE-370) 76 % injection (100 mLs  Contrast Given 06/25/18 2345)    Significant initial  Findings: Abnormal Labs Reviewed  COMPREHENSIVE METABOLIC PANEL - Abnormal; Notable for the following components:      Result Value   Glucose, Bld 105 (*)    Creatinine, Ser 1.42 (*)    Total Protein 6.0 (*)    GFR calc non Af Amer 55 (*)    All other components within normal limits     Lactic Acid, Venous No results found for: LATICACIDVEN  Na 136 K 4.0  Cr Up from baseline see below Lab Results  Component Value Date   CREATININE 1.42 (H) 06/25/2018   CREATININE 0.85 02/14/2013   CREATININE 0.88 01/31/2013    Lipase 40  WBC  10.1  HG/HCT  Stable     Component Value Date/Time   HGB 14.2 06/25/2018 2232   HCT 42.5 06/25/2018 2232     Troponin (Point of Care Test) No results for input(s): TROPIPOC in the last 72 hours.   BNP (last 3 results) No results for input(s): BNP in the last 8760 hours.  ProBNP (last 3 results) No results for input(s): PROBNP in the last 8760 hours.    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 --      Excl. in [place]? --   TMAX(24)@       Latest  Blood pressure (!) 154/103, pulse 70, temperature 97.9 F (36.6 C), temperature source Oral, resp. rate 16, height 6' (1.829 m), weight 84 kg, SpO2 96 %.   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   Review of Systems:    Pertinent positives include: abdominal pain, nausea,  Constitutional:  No weight loss, night sweats, Fevers, chills, fatigue, weight loss  HEENT:  No headaches, Difficulty swallowing,Tooth/dental problems,Sore throat,  No sneezing, itching, ear ache, nasal congestion, post nasal drip,  Cardio-vascular:  No chest pain, Orthopnea, PND, anasarca, dizziness, palpitations.no Bilateral lower extremity swelling  GI:  No heartburn, indigestion,  vomiting, diarrhea, change in bowel habits, loss of appetite, melena, blood in stool, hematemesis Resp:  no shortness of breath at rest. No dyspnea on exertion, No excess mucus, no productive cough, No non-productive cough, No coughing up of blood.No change in color of mucus.No wheezing. Skin:  no rash or lesions. No jaundice GU:  no dysuria, change in color of urine, no urgency or frequency. No straining to urinate.  No flank pain.  Musculoskeletal:  No joint pain or no joint swelling. No decreased range of motion. No back pain.  Psych:  No change in mood or affect. No depression or anxiety. No memory loss.  Neuro: no localizing neurological complaints, no tingling, no weakness, no double vision, no gait abnormality, no slurred speech, no confusion  All systems reviewed and apart from [place] all are negative  Past Medical History:   Past Medical History:  Diagnosis Date  . Depression       Past Surgical History:  Procedure Laterality Date  . CHOLECYSTECTOMY    . GALLBLADDER SURGERY  2003    Social History:  Ambulatory   independently      reports that he has been smoking cigarettes. He has been smoking about 3.00 packs per day. He has never used smokeless tobacco. He reports that he does not drink alcohol or use drugs.     Family  History:   Family History  Problem Relation Age of Onset  . Cancer Mother   . Dementia Mother   . Diabetes Mother   . Heart Problems Mother   . Liver cancer Father   . Colon cancer Father   . Alcohol abuse Maternal Grandfather   . Alcohol abuse Paternal Grandfather   . Colon polyps Sister   . Colon polyps Sister     Allergies: Allergies  Allergen Reactions  . Hydrocodone  Throat swelling     Prior to Admission medications   Medication Sig Start Date End Date Taking? Authorizing Provider  amphetamine-dextroamphetamine (ADDERALL) 15 MG tablet Take 1 tablet by mouth daily. 05/28/18  Yes Rutherford Guys, MD  FLUoxetine (PROZAC) 20 MG tablet Take 1 tablet (20 mg total) by mouth daily. 05/28/18  Yes Rutherford Guys, MD  gabapentin (NEURONTIN) 300 MG capsule Take 1 capsule (300 mg total) by mouth every morning AND 2 capsules (600 mg total) at bedtime. 05/28/18  Yes Rutherford Guys, MD  acyclovir (ZOVIRAX) 400 MG tablet Take 1 tablet (400 mg total) by mouth 2 (two) times daily. Patient not taking: Reported on 06/25/2018 02/19/18   Shawnee Knapp, MD  amphetamine-dextroamphetamine (ADDERALL) 15 MG tablet Take 1 tablet by mouth daily. Patient not taking: Reported on 06/25/2018 07/28/18   Rutherford Guys, MD  amphetamine-dextroamphetamine (ADDERALL) 15 MG tablet Take 1 tablet by mouth daily. Patient not taking: Reported on 06/25/2018 06/27/18   Rutherford Guys, MD   Physical Exam: Blood pressure (!) 154/103, pulse 70, temperature 97.9 F (36.6 C), temperature source Oral, resp. rate 16, height 6' (1.829 m), weight 84 kg, SpO2 96 %. 1. General:  in No Acute distress   acutely ill -appearing 2. Psychological: Alert and Oriented 3. Head/ENT:     Dry Mucous Membranes                          Head Non traumatic, neck supple                         Poor Dentition 4. SKIN:   decreased Skin turgor,  Skin clean Dry and intact no rash 5. Heart: Regular rate and rhythm no Murmur, no Rub  or gallop 6. Lungs:  Clear to auscultation bilaterally, no wheezes or crackles   7. Abdomen: Soft,  non-tender, Non distended   obese  bowel sounds present 8. Lower extremities: no clubbing, cyanosis, or  edema 9. Neurologically Grossly intact, moving all 4 extremities equally    10. MSK: Normal range of motion   LABS:     Recent Labs  Lab 06/25/18 2232  WBC 10.1  NEUTROABS 5.4  HGB 14.2  HCT 42.5  MCV 90.4  PLT 086   Basic Metabolic Panel: Recent Labs  Lab 06/25/18 2232  NA 136  K 4.0  CL 103  CO2 22  GLUCOSE 105*  BUN 9  CREATININE 1.42*  CALCIUM 9.0      Recent Labs  Lab 06/25/18 2232  AST 36  ALT 29  ALKPHOS 84  BILITOT 1.0  PROT 6.0*  ALBUMIN 3.5   Recent Labs  Lab 06/25/18 2232  LIPASE 40   No results for input(s): AMMONIA in the last 168 hours.    HbA1C: No results for input(s): HGBA1C in the last 72 hours. CBG: No results for input(s): GLUCAP in the last 168 hours.    Urine analysis:    Component Value Date/Time   COLORURINE YELLOW 06/25/2018 2232   APPEARANCEUR CLEAR 06/25/2018 2232   LABSPEC 1.016 06/25/2018 2232   PHURINE 6.0 06/25/2018 2232   GLUCOSEU NEGATIVE 06/25/2018 2232   HGBUR NEGATIVE 06/25/2018 2232   BILIRUBINUR NEGATIVE 06/25/2018 2232   KETONESUR NEGATIVE 06/25/2018 2232   PROTEINUR NEGATIVE 06/25/2018 2232   UROBILINOGEN 0.2 02/14/2013 0324   NITRITE NEGATIVE 06/25/2018 2232   LEUKOCYTESUR NEGATIVE 06/25/2018 2232    Cultures: No  results found for: [place], [place], [place], REPTSTATUS   Radiological Exams on Admission: Ct Renal Stone Study  Result Date: 06/25/2018 CLINICAL DATA:  Mid abdominal cramping pain EXAM: CT ABDOMEN AND PELVIS WITHOUT CONTRAST TECHNIQUE: Multidetector CT imaging of the abdomen and pelvis was performed following the standard protocol without IV contrast. COMPARISON:  Ultrasound 09/04/2004 FINDINGS: Lower chest: No acute abnormality. Hepatobiliary: No focal liver abnormality is seen.  Status post cholecystectomy. No biliary dilatation. Pancreas: Unremarkable. No pancreatic ductal dilatation or surrounding inflammatory changes. Spleen: Normal in size without focal abnormality. Adrenals/Urinary Tract: Adrenal glands are unremarkable. Kidneys are normal, without renal calculi, focal lesion, or hydronephrosis. Bladder is unremarkable. Stomach/Bowel: Stomach is within normal limits. Appendix appears normal. No evidence of bowel wall thickening, distention, or inflammatory changes. Vascular/Lymphatic: Mild aortic atherosclerosis. Mild edema and hazy indistinct appearance surrounding the origins of the celiac and superior mesenteric arteries. No definite evidence for intramural hematoma. No significantly enlarged lymph nodes. Reproductive: Prostate is unremarkable. Other: No free air or free fluid. Musculoskeletal: Degenerative changes. No acute or suspicious abnormality IMPRESSION: 1. Negative for hydronephrosis or ureteral stone. 2. Edema and indistinct hazy density around the proximal celiac and SMA arteries, further evaluation limited due to absence of contrast. Differential considerations include vasculitis versus possible thrombosis or dissection. Recommend CT angiography for further evaluation. Electronically Signed   By: Donavan Foil M.D.   On: 06/25/2018 23:14   Ct Angio Chest/abd/pel For Dissection W And/or W/wo  Result Date: 06/26/2018 CLINICAL DATA:  Mid abdominal cramping radiating to the back. Stranding about the proximal celiac and SMA arteries on noncontrast CT. EXAM: CT ANGIOGRAPHY CHEST, ABDOMEN AND PELVIS TECHNIQUE: Multidetector CT imaging through the chest, abdomen and pelvis was performed using the standard protocol during bolus administration of intravenous contrast. Multiplanar reconstructed images and MIPs were obtained and reviewed to evaluate the vascular anatomy. CONTRAST:  161mL ISOVUE-370 IOPAMIDOL (ISOVUE-370) INJECTION 76% COMPARISON:  Noncontrast abdominal CT  earlier this day. FINDINGS: CTA CHEST FINDINGS Cardiovascular: Normal caliber thoracic aorta without dissection, aneurysm, aortic hematoma or acute aortic syndrome. Mild noncalcified plaque about the descending thoracic aorta. Conventional branching pattern from the aortic arch. Central pulmonary arteries are well opacified without pulmonary embolus. The heart is normal in size. No pericardial effusion. Mediastinum/Nodes: No enlarged mediastinal or hilar lymph nodes. Esophagus is nondistended. No visualized thyroid nodule. Lungs/Pleura: Moderate emphysema. No focal airspace disease. 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 No pleural fluid or pulmonary edema. No pulmonary mass or suspicious nodule. Trachea and mainstem bronchi are patent with mild central bronchial thickening. Musculoskeletal: There are no acute or suspicious osseous abnormalities. Review of the MIP images confirms the above findings. CTA ABDOMEN AND PELVIS FINDINGS VASCULAR Aorta: Normal in caliber without dissection or aneurysm. Calcified noncalcified plaque throughout, most prominent distally. Minimal luminal narrowing secondary to noncalcified plaque proximal to the iliac bifurcation. Celiac: Patent without dissection, aneurysm, or significant stenosis. There is mild perivascular stranding as seen on noncontrast exam. SMA: Patent without dissection or aneurysm. Perivascular stranding with mild intimal thickening. Minimal narrowing proximally but no significant stenosis. Distal branches are patent. Renals: Both renal arteries are patent without evidence of aneurysm, dissection, vasculitis, fibromuscular dysplasia or significant stenosis. IMA: Patent without perivascular haziness. No dissection. Inflow: Patent without evidence of aneurysm, dissection, vasculitis or significant stenosis. Mild noncalcified plaque of the common iliac arteries with vascular tortuosity. No perivascular stranding. Veins: No obvious venous  abnormality within the limitations of this arterial phase study. Review of the MIP images  confirms the above findings. NON-VASCULAR Hepatobiliary: No focal liver abnormality is seen. Status post cholecystectomy. No biliary dilatation. Pancreas: No ductal dilatation or inflammation. Spleen: Normal in size and arterial enhancement. Adrenals/Urinary Tract: Normal adrenal glands. Homogeneous renal enhancement. No perinephric edema or hydronephrosis. Urinary bladder is physiologically distended without bladder wall thickening. Stomach/Bowel: Stomach distended with ingested contents. No bowel wall thickening or inflammatory change. Normal appendix. Moderate stool burden throughout the colon. Lymphatic: No adenopathy. Reproductive: Prostate is unremarkable. Other: No free air or free fluid. Musculoskeletal: There are no acute or suspicious osseous abnormalities. Lumbar spondylosis. Sclerosis about the sacroiliac joints. Review of the MIP images confirms the above findings. IMPRESSION: 1. Perivascular haziness about the proximal SMA and celiac artery suspicious for vasculitis. Vessels are patent without dissection. 2. Mild thoracoabdominal aortic atherosclerosis without dissection or acute aortic abnormality. 3. No acute abnormality in the chest. 4. Moderate emphysema. Emphysema (ICD10-J43.9). Aortic Atherosclerosis (ICD10-I70.0). Electronically Signed   By: Keith Rake M.D.   On: 06/26/2018 00:22    Chart has been reviewed    Assessment/Plan  57 y.o. male with medical history significant of attention deficit disorder, depression, tobacco abuse  Admitted for vasculitis of SMA and celiac artery  Present on Admission:  Vasculitis involving SMA and celiac artery -appreciate vascular surgery input start aspirin 325 clear liquid diet patient received a dose of steroids in ER will continue at 60 every 12 is could be adjusted at a later time.  Order extensive autoimmune work-up including ANA, ANCA, sed rate, CRP,  complement levels, hepatitis serology, HIV, cryoglobulins, UA unremarkable Given shortness of breath obtain chest x-ray Given reports of upper extremity symptoms at a later time would benefit from further imaging of upper extremities vasculature hold off an outpatient have received a CT angiogram in given AKI would rehydrate prior to exposure again  . AKI (acute kidney injury) ([place]) -rehydrate obtain urine electrolytes  Tobacco  Abuse -  - Spoke about importance of quitting spent 5 minutes discussing options for treatment, prior attempts at quitting, and dangers of smoking  -At this point patient is     interested in quitting  - order nicotine patch   - nursing tobacco cessation protocol  Other plan as per orders.  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  I expect  patient to be hospitalized for 2 midnights requiring inpatient medical care.  Patient is at high risk for adverse outcome (such as loss of life or disability) if not treated.  Indication for inpatient stay as follows:    severe pain requiring acute inpatient management,  inability to maintain oral hydration   Possible Need for operative/procedural  intervention Need for  IV fluids  IV medications     Level of care       tele  For  24H           Shondell Poulson 06/26/2018, 2:24 AM    [facility]    Pager [phone]   after 2 AM please page floor coverage PA If 7AM-7PM, please contact the day team taking care of the patient  [email or web address]  Password [email or web address]

## 2018-06-26 NOTE — ED Notes (Signed)
Family at bedside. 

## 2018-06-26 NOTE — Consult Note (Addendum)
Hospital Consult    Reason for Consult:  Vasculitis SMA/celiac Requesting Physician:  Roel Cluck MRN #:  [id number]  History of Present Illness: This is a 57 y.o. male who presented to the ED last night with complaints of abdominal pain.  He states that he felt like he had the feeling of hunger and then ate around 4pm.   About 30 minutes to an hour afterward, he started developing sharp abdominal pain and points to his epigastric area and his back.  He states that he took five 200mg  ibuprofen and did not get any relief.  He then presented to the ED.  He states that he got very little relief with the Morphine but then when he got Dilaudid, his pain resolved back to the original "hunger" feeling and states he does not have pain currently and wants to know when he can eat.    He is a current every day smoker and smokes 2ppd.  He states that he almost quit at one point on Chantix, but had to quit taking it due to the severe side effects.  He denies any pain in his legs while walking.  He denies any hx of stroke.    He is on Adderall for ADD.   Past Medical History:  Diagnosis Date  . Depression     Past Surgical History:  Procedure Laterality Date  . CHOLECYSTECTOMY    . GALLBLADDER SURGERY  2003    Allergies  Allergen Reactions  . Hydrocodone     Throat swelling    Prior to Admission medications   Medication Sig Start Date End Date Taking? Authorizing Provider  amphetamine-dextroamphetamine (ADDERALL) 15 MG tablet Take 1 tablet by mouth daily. 05/28/18  Yes Rutherford Guys, MD  FLUoxetine (PROZAC) 20 MG tablet Take 1 tablet (20 mg total) by mouth daily. 05/28/18  Yes Rutherford Guys, MD  gabapentin (NEURONTIN) 300 MG capsule Take 1 capsule (300 mg total) by mouth every morning AND 2 capsules (600 mg total) at bedtime. 05/28/18  Yes Rutherford Guys, MD  acyclovir (ZOVIRAX) 400 MG tablet Take 1 tablet (400 mg total) by mouth 2 (two) times daily. Patient not taking: Reported on  06/25/2018 02/19/18   Shawnee Knapp, MD  amphetamine-dextroamphetamine (ADDERALL) 15 MG tablet Take 1 tablet by mouth daily. Patient not taking: Reported on 06/25/2018 07/28/18   Rutherford Guys, MD  amphetamine-dextroamphetamine (ADDERALL) 15 MG tablet Take 1 tablet by mouth daily. Patient not taking: Reported on 06/25/2018 06/27/18   Rutherford Guys, MD    Social History   Socioeconomic History  . Marital status: Divorced    Spouse name: Not on file  . Number of children: Not on file  . Years of education: Not on file  . Highest education level: Not on file  Occupational History  . Not on file  Social Needs  . Financial resource strain: Not on file  . Food insecurity:    Worry: Not on file    Inability: Not on file  . Transportation needs:    Medical: Not on file    Non-medical: Not on file  Tobacco Use  . Smoking status: Current Every Day Smoker    Packs/day: 3.00    Types: Cigarettes  . Smokeless tobacco: Never Used  Substance and Sexual Activity  . Alcohol use: No  . Drug use: No    Frequency: 7.0 times per week    Types: IV  . Sexual activity: Not on file  Lifestyle  .  Physical activity:    Days per week: Not on file    Minutes per session: Not on file  . Stress: Not on file  Relationships  . Social connections:    Talks on phone: Not on file    Gets together: Not on file    Attends religious service: Not on file    Active member of club or organization: Not on file    Attends meetings of clubs or organizations: Not on file    Relationship status: Not on file  . Intimate partner violence:    Fear of current or ex partner: Not on file    Emotionally abused: Not on file    Physically abused: Not on file    Forced sexual activity: Not on file  Other Topics Concern  . Not on file  Social History Narrative  . Not on file     Family History  Problem Relation Age of Onset  . Cancer Mother   . Dementia Mother   . Diabetes Mother   . Heart Problems Mother    . Liver cancer Father   . Colon cancer Father   . Alcohol abuse Maternal Grandfather   . Alcohol abuse Paternal Grandfather   . Colon polyps Sister   . Colon polyps Sister     ROS: [x]  Positive   [ ]  Negative   [ ]  All sytems reviewed and are negative  Cardiac: []  chest pain/pressure []  palpitations []  SOB lying flat []  DOE  Vascular: []  pain in legs while walking []  pain in legs at rest []  pain in legs at night []  non-healing ulcers []  hx of DVT []  swelling in legs  Pulmonary: []  productive cough []  asthma/wheezing []  home O2  Neurologic: []  weakness in []  arms []  legs []  numbness in []  arms []  legs []  hx of CVA []  mini stroke [] difficulty speaking or slurred speech []  temporary loss of vision in one eye []  dizziness  Hematologic: []  hx of cancer []  bleeding problems []  problems with blood clotting easily  Endocrine:   []  diabetes []  thyroid disease  GI []  vomiting blood []  blood in stool  GU: []  CKD/renal failure []  HD--[]  M/W/F or []  T/T/S []  burning with urination []  blood in urine  Psychiatric: []  anxiety []  depression  Musculoskeletal: []  arthritis []  joint pain  Integumentary: []  rashes []  ulcers  Constitutional: []  fever []  chills   Physical Examination  Vitals:   06/26/18 0215 06/26/18 0251  BP:  (!) 150/92  Pulse: 65 68  Resp: 11   Temp:  (!) 97.4 F (36.3 C)  SpO2: 95% 97%   Body mass index is 24.03 kg/m.  General:  WDWN in NAD Gait: Not observed HENT: WNL, normocephalic Pulmonary: normal non-labored breathing, without Rales, rhonchi,  wheezing Cardiac: regular, without  Murmurs, rubs or gallops; without carotid bruits Abdomen:  soft, NT/ND, no masses Skin: without rashes Vascular Exam/Pulses:  Right Left  Radial 2+ (normal) 2+ (normal)  DP 2+ (normal) 2+ (normal)  PT 2+ (normal) 2+ (normal)   Extremities: without ischemic changes, without Gangrene , without cellulitis; without open wounds;    Musculoskeletal: no muscle wasting or atrophy  Neurologic: A&O X 3;  No focal weakness or paresthesias are detected; speech is fluent/normal Psychiatric:  The pt has flat affect.  CBC    Component Value Date/Time   WBC 8.6 06/26/2018 0319   RBC 4.96 06/26/2018 0319   HGB 14.6 06/26/2018 0319   HCT 45.3 06/26/2018 0319  PLT 384 06/26/2018 0319   MCV 91.3 06/26/2018 0319   MCH 29.4 06/26/2018 0319   MCHC 32.2 06/26/2018 0319   RDW 14.2 06/26/2018 0319   LYMPHSABS 3.6 06/25/2018 2232   MONOABS 0.7 06/25/2018 2232   EOSABS 0.4 06/25/2018 2232   BASOSABS 0.1 06/25/2018 2232    BMET    Component Value Date/Time   NA 134 (L) 06/26/2018 0319   K 4.3 06/26/2018 0319   CL 101 06/26/2018 0319   CO2 28 06/26/2018 0319   GLUCOSE 105 (H) 06/26/2018 0319   BUN 10 06/26/2018 0319   CREATININE 1.35 (H) 06/26/2018 0319   CALCIUM 9.3 06/26/2018 0319   GFRNONAA 58 (L) 06/26/2018 0319   GFRAA >60 06/26/2018 0319    COAGS: No results found for: INR, PROTIME   Non-Invasive Vascular Imaging:   CTA chest abdomen pelvis 06/25/18 IMPRESSION: 1. Perivascular haziness about the proximal SMA and celiac artery suspicious for vasculitis. Vessels are patent without dissection. 2. Mild thoracoabdominal aortic atherosclerosis without dissection or acute aortic abnormality. 3. No acute abnormality in the chest. 4. Moderate emphysema.  Statin:  No. Beta Blocker:  No. Aspirin:  Yes (started on admission) ACEI:  No. ARB:  No. CCB use:  No Other antiplatelets/anticoagulants:  No.    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  Pt had normal sed rate & CRP. -will need rheumatology consult -Dr. Donzetta Matters to see pt later this morning     Leontine Locket, PA-C [facility] [phone]   I have interviewed and  examined patient with PA and agree with assessment and plan above.  There is appreciable stranding around the celiac axis and proximal SMA without any flow-limiting stenosis.  Given normal inflammatory markers unlikely vasculitis but would benefit from rheumatology evaluation which would likely be as outpatient.  I would recommend baby aspirin and smoking cessation.  No vascular intervention necessary and can follow-up on an as-needed basis.  Annye Forrey C. Donzetta Matters, MD [facility] [place] Office: [phone] Pager: [phone]

## 2018-06-26 NOTE — ED Notes (Signed)
Patient transported to X-ray 

## 2018-06-26 NOTE — Discharge Summary (Addendum)
Physician Discharge Summary  Louis Zimmerman GMW:[id number] DOB: [birth year] DOA: 06/25/2018  PCP: Shawnee Knapp, MD  Admit date: 06/25/2018 Discharge date: 06/26/2018  Time spent: 40 minutes  Recommendations for Outpatient Follow-up:  Follow-up PCP in 2 weeks. Needs rheumatology referral as outpatient Follow labs for vasculitis ordered in the hospital Nephrology referral as outpatient  Discharge Diagnoses:  Active Problems:   Vasculitis ([place])   AKI (acute kidney injury) [facility])   Discharge Condition: Stable  Diet recommendation: Heart healthy  Filed Weights   06/25/18 2212 06/26/18 0251  Weight: 84 kg 82.6 kg    History of present illness:  57 year old male with history of ADD on Adderall came to the ED with complaints of abdominal pain.  CT renal protocol could not rule out possible SMA dissection CT abdomen was ordered which showed perivascular haziness about the proximal SMA and celiac artery suspicious for vasculitis.  But no dissection and vessels were patent.  Hospital Course:   Vascular surgery was consulted and he was seen by Dr. Donzetta Matters, who does not feel the patient has vasculitis.  He recommended to discharge patient home on Aspirin 81 mg po daily.  He also recommends patient to get a rheumatology referral as outpatient.  All the work-up for vasculitis currently pending.  PCP to follow labs as outpatient.  Mild AK versus CKD stage II-patient's creatinine stable at 1.35, last creatinine was 0.85 2014.  Needs nephrology referral as outpatient.  ADD-continue Adderall daily.  Procedures:  None  Consultations:  Vascular surgeon  Discharge Exam: Vitals:   06/26/18 0215 06/26/18 0251  BP:  (!) 150/92  Pulse: 65 68  Resp: 11   Temp:  (!) 97.4 F (36.3 C)  SpO2: 95% 97%    General: Appears in no acute distress Cardiovascular: S1-S2, regular Respiratory: Clear to auscultation bilaterally  Discharge Instructions   Discharge Instructions    Diet - low  sodium heart healthy   Complete by:  As directed    Increase activity slowly   Complete by:  As directed      Allergies as of 06/26/2018      Reactions   Hydrocodone    Throat swelling      Medication List    TAKE these medications   acyclovir 400 MG tablet Commonly known as:  ZOVIRAX Take 1 tablet (400 mg total) by mouth 2 (two) times daily.   amphetamine-dextroamphetamine 15 MG tablet Commonly known as:  ADDERALL Take 1 tablet by mouth daily. Start taking on:  07/28/2018 What changed:  Another medication with the same name was removed. Continue taking this medication, and follow the directions you see here.   aspirin EC 81 MG tablet Take 1 tablet (81 mg total) by mouth daily.   FLUoxetine 20 MG tablet Commonly known as:  PROZAC Take 1 tablet (20 mg total) by mouth daily.   gabapentin 300 MG capsule Commonly known as:  NEURONTIN Take 1 capsule (300 mg total) by mouth every morning AND 2 capsules (600 mg total) at bedtime.      Allergies  Allergen Reactions  . Hydrocodone     Throat swelling      The results of significant diagnostics from this hospitalization (including imaging, microbiology, ancillary and laboratory) are listed below for reference.    Significant Diagnostic Studies: Dg Chest 2 View  Result Date: 06/26/2018 CLINICAL DATA:  Chest pain and shortness of breath for 2 days. Vasculitis. Acute kidney injury. EXAM: CHEST - 2 VIEW COMPARISON:  09/13/2017 FINDINGS: The  heart size and mediastinal contours are within normal limits. Both lungs are clear. The visualized skeletal structures are unremarkable. IMPRESSION: No active cardiopulmonary disease. Electronically Signed   By: Earle Gell M.D.   On: 06/26/2018 02:56   Ct Renal Stone Study  Result Date: 06/25/2018 CLINICAL DATA:  Mid abdominal cramping pain EXAM: CT ABDOMEN AND PELVIS WITHOUT CONTRAST TECHNIQUE: Multidetector CT imaging of the abdomen and pelvis was performed following the standard  protocol without IV contrast. COMPARISON:  Ultrasound 09/04/2004 FINDINGS: Lower chest: No acute abnormality. Hepatobiliary: No focal liver abnormality is seen. Status post cholecystectomy. No biliary dilatation. Pancreas: Unremarkable. No pancreatic ductal dilatation or surrounding inflammatory changes. Spleen: Normal in size without focal abnormality. Adrenals/Urinary Tract: Adrenal glands are unremarkable. Kidneys are normal, without renal calculi, focal lesion, or hydronephrosis. Bladder is unremarkable. Stomach/Bowel: Stomach is within normal limits. Appendix appears normal. No evidence of bowel wall thickening, distention, or inflammatory changes. Vascular/Lymphatic: Mild aortic atherosclerosis. Mild edema and hazy indistinct appearance surrounding the origins of the celiac and superior mesenteric arteries. No definite evidence for intramural hematoma. No significantly enlarged lymph nodes. Reproductive: Prostate is unremarkable. Other: No free air or free fluid. Musculoskeletal: Degenerative changes. No acute or suspicious abnormality IMPRESSION: 1. Negative for hydronephrosis or ureteral stone. 2. Edema and indistinct hazy density around the proximal celiac and SMA arteries, further evaluation limited due to absence of contrast. Differential considerations include vasculitis versus possible thrombosis or dissection. Recommend CT angiography for further evaluation. Electronically Signed   By: Donavan Foil M.D.   On: 06/25/2018 23:14   Ct Angio Chest/abd/pel For Dissection W And/or W/wo  Result Date: 06/26/2018 CLINICAL DATA:  Mid abdominal cramping radiating to the back. Stranding about the proximal celiac and SMA arteries on noncontrast CT. EXAM: CT ANGIOGRAPHY CHEST, ABDOMEN AND PELVIS TECHNIQUE: Multidetector CT imaging through the chest, abdomen and pelvis was performed using the standard protocol during bolus administration of intravenous contrast. Multiplanar reconstructed images and MIPs were  obtained and reviewed to evaluate the vascular anatomy. CONTRAST:  169mL ISOVUE-370 IOPAMIDOL (ISOVUE-370) INJECTION 76% COMPARISON:  Noncontrast abdominal CT earlier this day. FINDINGS: CTA CHEST FINDINGS Cardiovascular: Normal caliber thoracic aorta without dissection, aneurysm, aortic hematoma or acute aortic syndrome. Mild noncalcified plaque about the descending thoracic aorta. Conventional branching pattern from the aortic arch. Central pulmonary arteries are well opacified without pulmonary embolus. The heart is normal in size. No pericardial effusion. Mediastinum/Nodes: No enlarged mediastinal or hilar lymph nodes. Esophagus is nondistended. No visualized thyroid nodule. Lungs/Pleura: Moderate emphysema. No focal airspace disease. 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 No pleural fluid or pulmonary edema. No pulmonary mass or suspicious nodule. Trachea and mainstem bronchi are patent with mild central bronchial thickening. Musculoskeletal: There are no acute or suspicious osseous abnormalities. Review of the MIP images confirms the above findings. CTA ABDOMEN AND PELVIS FINDINGS VASCULAR Aorta: Normal in caliber without dissection or aneurysm. Calcified noncalcified plaque throughout, most prominent distally. Minimal luminal narrowing secondary to noncalcified plaque proximal to the iliac bifurcation. Celiac: Patent without dissection, aneurysm, or significant stenosis. There is mild perivascular stranding as seen on noncontrast exam. SMA: Patent without dissection or aneurysm. Perivascular stranding with mild intimal thickening. Minimal narrowing proximally but no significant stenosis. Distal branches are patent. Renals: Both renal arteries are patent without evidence of aneurysm, dissection, vasculitis, fibromuscular dysplasia or significant stenosis. IMA: Patent without perivascular haziness. No dissection. Inflow: Patent without evidence of aneurysm, dissection, vasculitis or  significant stenosis. Mild noncalcified plaque of the  common iliac arteries with vascular tortuosity. No perivascular stranding. Veins: No obvious venous abnormality within the limitations of this arterial phase study. Review of the MIP images confirms the above findings. NON-VASCULAR Hepatobiliary: No focal liver abnormality is seen. Status post cholecystectomy. No biliary dilatation. Pancreas: No ductal dilatation or inflammation. Spleen: Normal in size and arterial enhancement. Adrenals/Urinary Tract: Normal adrenal glands. Homogeneous renal enhancement. No perinephric edema or hydronephrosis. Urinary bladder is physiologically distended without bladder wall thickening. Stomach/Bowel: Stomach distended with ingested contents. No bowel wall thickening or inflammatory change. Normal appendix. Moderate stool burden throughout the colon. Lymphatic: No adenopathy. Reproductive: Prostate is unremarkable. Other: No free air or free fluid. Musculoskeletal: There are no acute or suspicious osseous abnormalities. Lumbar spondylosis. Sclerosis about the sacroiliac joints. Review of the MIP images confirms the above findings. IMPRESSION: 1. Perivascular haziness about the proximal SMA and celiac artery suspicious for vasculitis. Vessels are patent without dissection. 2. Mild thoracoabdominal aortic atherosclerosis without dissection or acute aortic abnormality. 3. No acute abnormality in the chest. 4. Moderate emphysema. Emphysema (ICD10-J43.9). Aortic Atherosclerosis (ICD10-I70.0). Electronically Signed   By: Keith Rake M.D.   On: 06/26/2018 00:22    Microbiology: No results found for this or any previous visit (from the past 240 hour(s)).   Labs: Basic Metabolic Panel: Recent Labs  Lab 06/25/18 2232 06/26/18 0319  NA 136 134*  K 4.0 4.3  CL 103 101  CO2 22 28  GLUCOSE 105* 105*  BUN 9 10  CREATININE 1.42* 1.35*  CALCIUM 9.0 9.3  MG  --  2.3  PHOS  --  2.6   Liver Function Tests: Recent Labs   Lab 06/25/18 2232 06/26/18 0319  AST 36 25  ALT 29 29  ALKPHOS 84 87  BILITOT 1.0 0.4  PROT 6.0* 6.6  ALBUMIN 3.5 3.8   Recent Labs  Lab 06/25/18 2232  LIPASE 40   No results for input(s): AMMONIA in the last 168 hours. CBC: Recent Labs  Lab 06/25/18 2232 06/26/18 0319  WBC 10.1 8.6  NEUTROABS 5.4  --   HGB 14.2 14.6  HCT 42.5 45.3  MCV 90.4 91.3  PLT 337 384       Signed:  Oswald Hillock MD.  [facility] 06/26/2018, 10:53 AM

## 2018-06-26 NOTE — Progress Notes (Signed)
*  PRELIMINARY RESULTS* Echocardiogram 2D Echocardiogram has been performed.  Leavy Cella 06/26/2018, 1:30 PM

## 2018-06-27 ENCOUNTER — Emergency Department (HOSPITAL_COMMUNITY)
Admission: EM | Admit: 2018-06-27 | Discharge: 2018-06-27 | Disposition: A | Discharge status: Home / Self Care | Payer: Self-pay | Attending: Emergency Medicine | Admitting: Emergency Medicine

## 2018-06-27 ENCOUNTER — Emergency Department (HOSPITAL_COMMUNITY): Payer: Self-pay

## 2018-06-27 ENCOUNTER — Other Ambulatory Visit: Payer: Self-pay

## 2018-06-27 ENCOUNTER — Encounter (HOSPITAL_COMMUNITY): Payer: Self-pay

## 2018-06-27 DIAGNOSIS — R110 Nausea: Secondary | ICD-10-CM | POA: Insufficient documentation

## 2018-06-27 DIAGNOSIS — Z7982 Long term (current) use of aspirin: Secondary | ICD-10-CM | POA: Insufficient documentation

## 2018-06-27 DIAGNOSIS — R1011 Right upper quadrant pain: Secondary | ICD-10-CM | POA: Insufficient documentation

## 2018-06-27 DIAGNOSIS — Z79899 Other long term (current) drug therapy: Secondary | ICD-10-CM | POA: Insufficient documentation

## 2018-06-27 DIAGNOSIS — R109 Unspecified abdominal pain: Secondary | ICD-10-CM

## 2018-06-27 DIAGNOSIS — F17210 Nicotine dependence, cigarettes, uncomplicated: Secondary | ICD-10-CM | POA: Insufficient documentation

## 2018-06-27 LAB — CBC WITH DIFFERENTIAL/PLATELET
Abs Immature Granulocytes: 0.1 10*3/uL — ABNORMAL HIGH (ref 0.00–0.07)
Basophils Absolute: 0.1 10*3/uL (ref 0.0–0.1)
Basophils Relative: 0 %
Eosinophils Absolute: 0.2 10*3/uL (ref 0.0–0.5)
Eosinophils Relative: 1 %
HCT: 43.4 % (ref 39.0–52.0)
HEMOGLOBIN: 14.1 g/dL (ref 13.0–17.0)
Immature Granulocytes: 1 %
Lymphocytes Relative: 31 %
Lymphs Abs: 5 10*3/uL — ABNORMAL HIGH (ref 0.7–4.0)
MCH: 29.7 pg (ref 26.0–34.0)
MCHC: 32.5 g/dL (ref 30.0–36.0)
MCV: 91.6 fL (ref 80.0–100.0)
Monocytes Absolute: 0.8 10*3/uL (ref 0.1–1.0)
Monocytes Relative: 5 %
NEUTROS PCT: 62 %
Neutro Abs: 9.8 10*3/uL — ABNORMAL HIGH (ref 1.7–7.7)
Platelets: 350 10*3/uL (ref 150–400)
RBC: 4.74 MIL/uL (ref 4.22–5.81)
RDW: 14.4 % (ref 11.5–15.5)
WBC: 15.9 10*3/uL — ABNORMAL HIGH (ref 4.0–10.5)
nRBC: 0 % (ref 0.0–0.2)

## 2018-06-27 LAB — COMPREHENSIVE METABOLIC PANEL
ALBUMIN: 3.5 g/dL (ref 3.5–5.0)
ALK PHOS: 74 U/L (ref 38–126)
ALT: 25 U/L (ref 0–44)
AST: 22 U/L (ref 15–41)
Anion gap: 10 (ref 5–15)
BUN: 11 mg/dL (ref 6–20)
CO2: 23 mmol/L (ref 22–32)
Calcium: 8.9 mg/dL (ref 8.9–10.3)
Chloride: 104 mmol/L (ref 98–111)
Creatinine, Ser: 1.1 mg/dL (ref 0.61–1.24)
GFR calc Af Amer: >60 mL/min (ref >60)
GFR calc non Af Amer: >60 mL/min (ref >60)
Glucose, Bld: 68 mg/dL — ABNORMAL LOW (ref 70–99)
Potassium: 3.4 mmol/L — ABNORMAL LOW (ref 3.5–5.1)
SODIUM: 137 mmol/L (ref 135–145)
Total Bilirubin: 0.3 mg/dL (ref 0.3–1.2)
Total Protein: 6.3 g/dL — ABNORMAL LOW (ref 6.5–8.1)

## 2018-06-27 LAB — I-STAT CHEM 8, ED
BUN: 13 mg/dL (ref 6–20)
Calcium, Ion: 1.05 mmol/L — ABNORMAL LOW (ref 1.15–1.40)
Chloride: 106 mmol/L (ref 98–111)
Creatinine, Ser: 1.1 mg/dL (ref 0.61–1.24)
Glucose, Bld: 64 mg/dL — ABNORMAL LOW (ref 70–99)
HCT: 42 % (ref 39.0–52.0)
Hemoglobin: 14.3 g/dL (ref 13.0–17.0)
POTASSIUM: 3.5 mmol/L (ref 3.5–5.1)
Sodium: 138 mmol/L (ref 135–145)
TCO2: 27 mmol/L (ref 22–32)

## 2018-06-27 LAB — HEPATITIS PANEL, ACUTE
HCV Ab: <0.1 s/co ratio (ref 0.0–0.9)
Hep A IgM: NEGATIVE
Hep B C IgM: NEGATIVE
Hepatitis B Surface Ag: NEGATIVE

## 2018-06-27 LAB — MPO/PR-3 (ANCA) ANTIBODIES
ANCA Proteinase 3: <3.5 U/mL (ref 0.0–3.5)
Myeloperoxidase Abs: <9 U/mL (ref 0.0–9.0)

## 2018-06-27 LAB — C4 COMPLEMENT: Complement C4, Body Fluid: 24 mg/dL (ref 14–44)

## 2018-06-27 LAB — ANA W/REFLEX IF POSITIVE: Anti Nuclear Antibody(ANA): NEGATIVE

## 2018-06-27 LAB — LIPASE, BLOOD: Lipase: 37 U/L (ref 11–51)

## 2018-06-27 LAB — I-STAT CG4 LACTIC ACID, ED: Lactic Acid, Venous: 1.83 mmol/L (ref 0.5–1.9)

## 2018-06-27 MED ORDER — ALUM & MAG HYDROXIDE-SIMETH 200-200-20 MG/5ML PO SUSP
15.0000 mL | Freq: Once | ORAL | Status: AC
  Administered 2018-06-27: 15 mL via ORAL
  Filled 2018-06-27: qty 30

## 2018-06-27 MED ORDER — PREDNISONE 20 MG PO TABS: ORAL_TABLET | ORAL | 0 refills | Status: DC

## 2018-06-27 MED ORDER — PREDNISONE 20 MG PO TABS
60.0000 mg | ORAL_TABLET | Freq: Once | ORAL | Status: AC
  Administered 2018-06-27: 60 mg via ORAL
  Filled 2018-06-27: qty 3

## 2018-06-27 MED ORDER — SODIUM CHLORIDE 0.9 % IV BOLUS
1000.0000 mL | Freq: Once | INTRAVENOUS | Status: AC
  Administered 2018-06-27: 1000 mL via INTRAVENOUS

## 2018-06-27 MED ORDER — DEXTROSE 50 % IV SOLN
1.0000 | Freq: Once | INTRAVENOUS | Status: AC
  Administered 2018-06-27: 50 mL via INTRAVENOUS
  Filled 2018-06-27: qty 50

## 2018-06-27 MED ORDER — ONDANSETRON HCL 4 MG/2ML IJ SOLN
4.0000 mg | Freq: Once | INTRAMUSCULAR | Status: AC
  Administered 2018-06-27: 4 mg via INTRAVENOUS
  Filled 2018-06-27: qty 2

## 2018-06-27 MED ORDER — IOPAMIDOL (ISOVUE-370) INJECTION 76%
100.0000 mL | Freq: Once | INTRAVENOUS | Status: AC | PRN
  Administered 2018-06-27: 100 mL via INTRAVENOUS

## 2018-06-27 MED ORDER — ONDANSETRON 4 MG PO TBDP: 4.0000 mg | ORAL_TABLET | Freq: Three times a day (TID) | ORAL | 0 refills | Status: DC | PRN

## 2018-06-27 MED ORDER — FAMOTIDINE IN NACL 20-0.9 MG/50ML-% IV SOLN
20.0000 mg | Freq: Once | INTRAVENOUS | Status: AC
  Administered 2018-06-27: 20 mg via INTRAVENOUS
  Filled 2018-06-27: qty 50

## 2018-06-27 MED ORDER — MORPHINE SULFATE (PF) 4 MG/ML IV SOLN
8.0000 mg | Freq: Once | INTRAVENOUS | Status: AC
  Administered 2018-06-27: 8 mg via INTRAVENOUS
  Filled 2018-06-27: qty 2

## 2018-06-27 NOTE — ED Provider Notes (Signed)
[facility] Provider Note   CSN: [id number] Arrival date & time: 06/27/18  1605     History   Chief Complaint Chief Complaint  Patient presents with  . Abdominal Pain    HPI Louis Zimmerman is a 57 y.o. male.  57 yo M with a chief complaint of right-sided abdominal pain.  Initially stated that he felt like it started in his back and wraps around to his abdomen.  Started about an hour after eating.  Felt that the pain got slightly worse over the course of 3 to 4 hours.  Has some nausea but denies vomiting.  No fevers or chills.  Patient was just in the hospital for similar pain and was thought to be vasculitis.  He was seen by vascular and with no elevated inflammatory markers they thought it may be a rheumatologic process.  They recommended follow-up as an outpatient his symptoms that improved.  The history is provided by the patient.  Abdominal Pain   This is a recurrent problem. The current episode started 3 to 5 hours ago. The problem occurs constantly. The problem has not changed since onset.The pain is associated with eating. The pain is located in the RUQ. The quality of the pain is sharp and shooting. The pain is at a severity of 9/10. The pain is moderate. Associated symptoms include nausea. Pertinent negatives include fever, diarrhea, vomiting, headaches, arthralgias and myalgias. Nothing aggravates the symptoms. Nothing relieves the symptoms. Past workup includes CT scan.    Past Medical History:  Diagnosis Date  . Depression     Patient Active Problem List   Diagnosis Date Noted  . 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 AKI (acute kidney injury) ([place]) 06/26/2018  . 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 Attention deficit disorder (ADD) in adult 11/10/2017  . Moderate episode of recurrent major depressive disorder ([place]) 08/31/2017  . 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 Disturbed concentration 08/31/2017  . Tobacco use disorder  08/31/2017  . History of substance abuse ([place]) 02/23/2017  . Depression 02/02/2013    Past Surgical History:  Procedure Laterality Date  . CHOLECYSTECTOMY    . GALLBLADDER SURGERY  2003        Home Medications    Prior to Admission medications   Medication Sig Start Date End Date Taking? Authorizing Provider  amphetamine-dextroamphetamine (ADDERALL) 15 MG tablet Take 1 tablet by mouth daily. 07/28/18  Yes Rutherford Guys, MD  aspirin EC 81 MG tablet Take 1 tablet (81 mg total) by mouth daily. 06/26/18  Yes Oswald Hillock, MD  FLUoxetine (PROZAC) 20 MG tablet Take 1 tablet (20 mg total) by mouth daily. 05/28/18  Yes Rutherford Guys, MD  gabapentin (NEURONTIN) 300 MG capsule Take 1 capsule (300 mg total) by mouth every morning AND 2 capsules (600 mg total) at bedtime. 05/28/18  Yes Rutherford Guys, MD  acyclovir (ZOVIRAX) 400 MG tablet Take 1 tablet (400 mg total) by mouth 2 (two) times daily. Patient not taking: Reported on 06/25/2018 02/19/18   Shawnee Knapp, MD  ondansetron (ZOFRAN ODT) 4 MG disintegrating tablet Take 1 tablet (4 mg total) by mouth every 8 (eight) hours as needed for nausea or vomiting. 06/27/18   Deno Etienne, DO  predniSONE (DELTASONE) 20 MG tablet 2 tabs po daily x 4 days 06/27/18   Deno Etienne, DO    Family History Family History  Problem Relation Age of Onset  . Cancer Mother   . Dementia Mother   .  Diabetes Mother   . Heart Problems Mother   . Liver cancer Father   . Colon cancer Father   . Alcohol abuse Maternal Grandfather   . Alcohol abuse Paternal Grandfather   . Colon polyps Sister   . Colon polyps Sister     Social History Social History   Tobacco Use  . Smoking status: Current Every Day Smoker    Packs/day: 2.00    Types: Cigarettes  . Smokeless tobacco: Never Used  Substance Use Topics  . Alcohol use: No  . Drug use: No    Frequency: 7.0 times per week    Types: IV     Allergies   Hydrocodone   Review of Systems Review of  Systems  Constitutional: Negative for chills and fever.  HENT: Negative for congestion and facial swelling.   Eyes: Negative for discharge and visual disturbance.  Respiratory: Negative for shortness of breath.   Cardiovascular: Negative for chest pain and palpitations.  Gastrointestinal: Positive for abdominal pain and nausea. Negative for diarrhea and vomiting.  Musculoskeletal: Negative for arthralgias and myalgias.  Skin: Negative for color change and rash.  Neurological: Negative for tremors, syncope and headaches.  Psychiatric/Behavioral: Negative for confusion and dysphoric mood.     Physical Exam Updated Vital Signs BP (!) 155/98   Pulse (!) 53   Temp 98 F (36.7 C) (Oral)   Resp 16   Ht 6\' 1"  (1.854 m)   Wt 83.9 kg   SpO2 100%   BMI 24.41 kg/m   Physical Exam  Constitutional: He is oriented to person, place, and time. He appears well-developed and well-nourished.  HENT:  Head: Normocephalic and atraumatic.  Eyes: Pupils are equal, round, and reactive to light. EOM are normal.  Neck: Normal range of motion. Neck supple. No JVD present.  Cardiovascular: Normal rate and regular rhythm. Exam reveals no gallop and no friction rub.  No murmur heard. Pulmonary/Chest: No respiratory distress. He has no wheezes.  Abdominal: He exhibits no distension and no mass. There is no tenderness. There is no rebound and no guarding.  Musculoskeletal: Normal range of motion.  Neurological: He is alert and oriented to person, place, and time.  Skin: No rash noted. No pallor.  Psychiatric: He has a normal mood and affect. His behavior is normal.  Nursing note and vitals reviewed.    ED Treatments / Results  Labs (all labs ordered are listed, but only abnormal results are displayed) Labs Reviewed  CBC WITH DIFFERENTIAL/PLATELET - Abnormal; Notable for the following components:      Result Value   WBC 15.9 (*)    Neutro Abs 9.8 (*)    Lymphs Abs 5.0 (*)    Abs Immature  Granulocytes 0.10 (*)    All other components within normal limits  COMPREHENSIVE METABOLIC PANEL - Abnormal; Notable for the following components:   Potassium 3.4 (*)    Glucose, Bld 68 (*)    Total Protein 6.3 (*)    All other components within normal limits  I-STAT CHEM 8, ED - Abnormal; Notable for the following components:   Glucose, Bld 64 (*)    Calcium, Ion 1.05 (*)    All other components within normal limits  LIPASE, BLOOD  I-STAT CG4 LACTIC ACID, ED    EKG None  Radiology Dg Chest 2 View  Result Date: 06/26/2018 CLINICAL DATA:  Chest pain and shortness of breath for 2 days. Vasculitis. Acute kidney injury. EXAM: CHEST - 2 VIEW COMPARISON:  09/13/2017 FINDINGS:  The heart size and mediastinal contours are within normal limits. Both lungs are clear. The visualized skeletal structures are unremarkable. IMPRESSION: No active cardiopulmonary disease. Electronically Signed   By: Earle Gell M.D.   On: 06/26/2018 02:56   Ct Renal Stone Study  Result Date: 06/25/2018 CLINICAL DATA:  Mid abdominal cramping pain EXAM: CT ABDOMEN AND PELVIS WITHOUT CONTRAST TECHNIQUE: Multidetector CT imaging of the abdomen and pelvis was performed following the standard protocol without IV contrast. COMPARISON:  Ultrasound 09/04/2004 FINDINGS: Lower chest: No acute abnormality. Hepatobiliary: No focal liver abnormality is seen. Status post cholecystectomy. No biliary dilatation. Pancreas: Unremarkable. No pancreatic ductal dilatation or surrounding inflammatory changes. Spleen: Normal in size without focal abnormality. Adrenals/Urinary Tract: Adrenal glands are unremarkable. Kidneys are normal, without renal calculi, focal lesion, or hydronephrosis. Bladder is unremarkable. Stomach/Bowel: Stomach is within normal limits. Appendix appears normal. No evidence of bowel wall thickening, distention, or inflammatory changes. Vascular/Lymphatic: Mild aortic atherosclerosis. Mild edema and hazy indistinct  appearance surrounding the origins of the celiac and superior mesenteric arteries. No definite evidence for intramural hematoma. No significantly enlarged lymph nodes. Reproductive: Prostate is unremarkable. Other: No free air or free fluid. Musculoskeletal: Degenerative changes. No acute or suspicious abnormality IMPRESSION: 1. Negative for hydronephrosis or ureteral stone. 2. Edema and indistinct hazy density around the proximal celiac and SMA arteries, further evaluation limited due to absence of contrast. Differential considerations include vasculitis versus possible thrombosis or dissection. Recommend CT angiography for further evaluation. Electronically Signed   By: Donavan Foil M.D.   On: 06/25/2018 23:14   Ct Angio Chest/abd/pel For Dissection W And/or W/wo  Result Date: 06/26/2018 CLINICAL DATA:  Mid abdominal cramping radiating to the back. Stranding about the proximal celiac and SMA arteries on noncontrast CT. EXAM: CT ANGIOGRAPHY CHEST, ABDOMEN AND PELVIS TECHNIQUE: Multidetector CT imaging through the chest, abdomen and pelvis was performed using the standard protocol during bolus administration of intravenous contrast. Multiplanar reconstructed images and MIPs were obtained and reviewed to evaluate the vascular anatomy. CONTRAST:  169mL ISOVUE-370 IOPAMIDOL (ISOVUE-370) INJECTION 76% COMPARISON:  Noncontrast abdominal CT earlier this day. FINDINGS: CTA CHEST FINDINGS Cardiovascular: Normal caliber thoracic aorta without dissection, aneurysm, aortic hematoma or acute aortic syndrome. Mild noncalcified plaque about the descending thoracic aorta. Conventional branching pattern from the aortic arch. Central pulmonary arteries are well opacified without pulmonary embolus. The heart is normal in size. No pericardial effusion. Mediastinum/Nodes: No enlarged mediastinal or hilar lymph nodes. Esophagus is nondistended. No visualized thyroid nodule. Lungs/Pleura: Moderate emphysema. No focal airspace  disease. 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 No pleural fluid or pulmonary edema. No pulmonary mass or suspicious nodule. Trachea and mainstem bronchi are patent with mild central bronchial thickening. Musculoskeletal: There are no acute or suspicious osseous abnormalities. Review of the MIP images confirms the above findings. CTA ABDOMEN AND PELVIS FINDINGS VASCULAR Aorta: Normal in caliber without dissection or aneurysm. Calcified noncalcified plaque throughout, most prominent distally. Minimal luminal narrowing secondary to noncalcified plaque proximal to the iliac bifurcation. Celiac: Patent without dissection, aneurysm, or significant stenosis. There is mild perivascular stranding as seen on noncontrast exam. SMA: Patent without dissection or aneurysm. Perivascular stranding with mild intimal thickening. Minimal narrowing proximally but no significant stenosis. Distal branches are patent. Renals: Both renal arteries are patent without evidence of aneurysm, dissection, vasculitis, fibromuscular dysplasia or significant stenosis. IMA: Patent without perivascular haziness. No dissection. Inflow: Patent without evidence of aneurysm, dissection, vasculitis or significant stenosis. Mild noncalcified plaque of the  common iliac arteries with vascular tortuosity. No perivascular stranding. Veins: No obvious venous abnormality within the limitations of this arterial phase study. Review of the MIP images confirms the above findings. NON-VASCULAR Hepatobiliary: No focal liver abnormality is seen. Status post cholecystectomy. No biliary dilatation. Pancreas: No ductal dilatation or inflammation. Spleen: Normal in size and arterial enhancement. Adrenals/Urinary Tract: Normal adrenal glands. Homogeneous renal enhancement. No perinephric edema or hydronephrosis. Urinary bladder is physiologically distended without bladder wall thickening. Stomach/Bowel: Stomach distended with ingested contents. No  bowel wall thickening or inflammatory change. Normal appendix. Moderate stool burden throughout the colon. Lymphatic: No adenopathy. Reproductive: Prostate is unremarkable. Other: No free air or free fluid. Musculoskeletal: There are no acute or suspicious osseous abnormalities. Lumbar spondylosis. Sclerosis about the sacroiliac joints. Review of the MIP images confirms the above findings. IMPRESSION: 1. Perivascular haziness about the proximal SMA and celiac artery suspicious for vasculitis. Vessels are patent without dissection. 2. Mild thoracoabdominal aortic atherosclerosis without dissection or acute aortic abnormality. 3. No acute abnormality in the chest. 4. Moderate emphysema. Emphysema (ICD10-J43.9). Aortic Atherosclerosis (ICD10-I70.0). Electronically Signed   By: Keith Rake M.D.   On: 06/26/2018 00:22   Ct Angio Abd/pel W And/or Wo Contrast  Result Date: 06/27/2018 CLINICAL DATA:  Abdominal pain. Gastroenteritis or colitis suspected. Recent therapy for possible vasculitis involving the superior mesenteric artery. EXAM: CTA ABDOMEN AND PELVIS WITHOUT AND WITH CONTRAST TECHNIQUE: Multidetector CT imaging of the abdomen and pelvis was performed using the standard protocol during bolus administration of intravenous contrast. Multiplanar reconstructed images and MIPs were obtained and reviewed to evaluate the vascular anatomy. CONTRAST:  148mL ISOVUE-370 IOPAMIDOL (ISOVUE-370) INJECTION 76% COMPARISON:  06/25/2018. FINDINGS: VASCULAR Aorta: Noncalcified atherosclerotic plaque is identified. Normal caliber aorta without aneurysm, dissection, vasculitis or significant stenosis. Celiac: Similar appearance of mild perivascular soft tissue stranding about the celiac artery. No evidence of aneurysm, dissection, or significant stenosis. SMA: Soft tissue stranding surrounding the origin of the superior mesenteric artery is again identified and appears unchanged. No evidence for aneurysm, dissection or  stenosis. Renals: Minimal narrowing at the origin of the left renal artery. No stenosis of the right renal artery. No evidence to suggest aneurysm, dissection, vasculitis or fibromuscular dysplasia. IMA: Patent without evidence of aneurysm, dissection, vasculitis or significant stenosis. Inflow: Patent without evidence of aneurysm, dissection, vasculitis or significant stenosis. Mild noncalcified atherosclerotic plaque noted. Proximal Outflow: Bilateral common femoral and visualized portions of the superficial and profunda femoral arteries are patent without evidence of aneurysm, dissection, vasculitis or significant stenosis. Veins: No obvious venous abnormality within the limitations of this arterial phase study. Review of the MIP images confirms the above findings. NON-VASCULAR Lower chest: No acute abnormality. Hepatobiliary: No focal liver abnormality is seen. Status post cholecystectomy. No biliary dilatation. Pancreas: Unremarkable. No pancreatic ductal dilatation or surrounding inflammatory changes. Spleen: Normal in size without focal abnormality. Adrenals/Urinary Tract: Adrenal glands are unremarkable. Kidneys are normal, without renal calculi, focal lesion, or hydronephrosis. Bladder is unremarkable. Stomach/Bowel: Stomach is within normal limits. Appendix appears normal. No evidence of bowel wall thickening, distention, or inflammatory changes. No pneumatosis identified. Lymphatic: No enlarged abdominal or pelvic lymph nodes. Reproductive: Prostate is unremarkable. Other: No abdominal wall hernia or abnormality. No abdominopelvic ascites. Musculoskeletal: No acute or significant osseous findings. IMPRESSION: VASCULAR 1. Similar appearance of perivascular haziness about the proximal SMA and celiac artery which may reflect vasculitis. The vessels remain patent without dissection. 2.  Aortic Atherosclerosis (ICD10-I70.0). NON-VASCULAR 1. No evidence for bowel obstruction, bowel wall  thickening or  inflammation . Electronically Signed   By: Kerby Moors M.D.   On: 06/27/2018 18:36    Procedures Procedures (including critical care time)  Medications Ordered in ED Medications  alum & mag hydroxide-simeth (MAALOX/MYLANTA) 200-200-20 MG/5ML suspension 15 mL (has no administration in time range)  predniSONE (DELTASONE) tablet 60 mg (has no administration in time range)  sodium chloride 0.9 % bolus 1,000 mL (0 mLs Intravenous Stopped 06/27/18 1906)  morphine 4 MG/ML injection 8 mg (8 mg Intravenous Given 06/27/18 1720)  ondansetron (ZOFRAN) injection 4 mg (4 mg Intravenous Given 06/27/18 1721)  famotidine (PEPCID) IVPB 20 mg premix (0 mg Intravenous Stopped 06/27/18 1906)  dextrose 50 % solution 50 mL (50 mLs Intravenous Given 06/27/18 1732)  iopamidol (ISOVUE-370) 76 % injection 100 mL (100 mLs Intravenous Contrast Given 06/27/18 1740)     Initial Impression / Assessment and Plan / ED Course  I have reviewed the triage vital signs and the nursing notes.  Pertinent labs & imaging results that were available during my care of the patient were reviewed by me and considered in my medical decision making (see chart for details).     57 yo M with a chief complaint of right-sided abdominal pain.  He has a benign abdominal exam for me.  No pain to the right flank either.  Will obtain a CT scan this time and angiogram to evaluate the vasculature.   CT scan with continued haziness around the SMA.  I discussed the case with Dr. Gwenlyn Saran, at this point he is unsure of the exact diagnosis if this is vasculitis is not something that he would primarily manage he recommended follow-up with rheumatology.  He did recommend the patient stop smoking as well as take aspirin.  I discussed this with the patient who is feeling better and would like to go home.  He was somewhat concerned about his pain management at home.  I discussed sending him home with a short course of narcotics which he is against because he  has a history of narcotic addiction.  I discussed with him that the etiology of his symptoms are not yet known that he could potentially have some other cause of his abdominal tenderness.  I felt that gastritis or duodenitis was also a possibility, recommend starting him on Zantac or Pepcid given GI and rheumatology follow-up.  7:46 PM:  I have discussed the diagnosis/risks/treatment options with the patient and family and believe the pt to be eligible for discharge home to follow-up with PCP, GI, rheum. We also discussed returning to the ED immediately if new or worsening sx occur. We discussed the sx which are most concerning (e.g., sudden worsening pain, fever, inability to tolerate by mouth) that necessitate immediate return. Medications administered to the patient during their visit and any new prescriptions provided to the patient are listed below.  Medications given during this visit Medications  alum & mag hydroxide-simeth (MAALOX/MYLANTA) 200-200-20 MG/5ML suspension 15 mL (has no administration in time range)  predniSONE (DELTASONE) tablet 60 mg (has no administration in time range)  sodium chloride 0.9 % bolus 1,000 mL (0 mLs Intravenous Stopped 06/27/18 1906)  morphine 4 MG/ML injection 8 mg (8 mg Intravenous Given 06/27/18 1720)  ondansetron (ZOFRAN) injection 4 mg (4 mg Intravenous Given 06/27/18 1721)  famotidine (PEPCID) IVPB 20 mg premix (0 mg Intravenous Stopped 06/27/18 1906)  dextrose 50 % solution 50 mL (50 mLs Intravenous Given 06/27/18 1732)  iopamidol (ISOVUE-370) 76 % injection 100 mL (  100 mLs Intravenous Contrast Given 06/27/18 1740)      The patient appears reasonably screen and/or stabilized for discharge and I doubt any other medical condition or other [facility] requiring further screening, evaluation, or treatment in the ED at this time prior to discharge.   Final Clinical Impressions(s) / ED Diagnoses   Final diagnoses:  Right flank pain    ED Discharge Orders          Ordered    ondansetron (ZOFRAN ODT) 4 MG disintegrating tablet  Every 8 hours PRN     06/27/18 1942    predniSONE (DELTASONE) 20 MG tablet     06/27/18 1942           Deno Etienne, DO 06/27/18 1946

## 2018-06-27 NOTE — ED Notes (Signed)
Patient transported to CT 

## 2018-06-27 NOTE — ED Notes (Signed)
Patient verbalizes understanding of discharge instructions. Opportunity for questioning and answers were provided. Armband removed by staff, pt discharged from ED.  

## 2018-06-27 NOTE — Discharge Instructions (Signed)
Try zantac or pepcid twice a day.  Try to avoid things that may make this worse, most commonly these are spicy foods tomato based products fatty foods chocolate and peppermint.  Alcohol and tobacco can also make this worse.  Return to the emergency department for sudden worsening pain fever or inability to eat or drink. ° °

## 2018-06-27 NOTE — ED Triage Notes (Signed)
Pt from home w/ a c/o of right sided flank and abd pain that began after eating at 1100 today. Pt was tx on on 11/28 and admitted for similar pain. Pt states he had two swollen arteries that was blocking blood flow and was tx with steroids, pain meds, and blood thinners. Today he took Ibuprofen w/o relief of symptoms. Nausea but no V/D. The pain is constant, sharp, and cramping in nature. Last BM at 1500 today.

## 2018-06-28 LAB — COMPLEMENT, TOTAL: Compl, Total (CH50): >60 U/mL (ref 42–999999)

## 2018-06-29 LAB — ANCA TITERS
Atypical P-ANCA titer: <1:20 {titer}
C-ANCA: <1:20 {titer}
P-ANCA: <1:20 {titer}

## 2018-06-29 LAB — CRYOGLOBULIN

## 2018-07-01 LAB — CULTURE, BLOOD (ROUTINE X 2)
CULTURE: NO GROWTH
Culture: NO GROWTH

## 2018-09-07 ENCOUNTER — Ambulatory Visit: Payer: Self-pay | Admitting: Family Medicine

## 2018-09-07 ENCOUNTER — Encounter: Payer: Self-pay | Admitting: Family Medicine

## 2018-09-07 ENCOUNTER — Other Ambulatory Visit: Payer: Self-pay

## 2018-09-07 VITALS — BP 129/85 | HR 71 | Temp 99.7°F | Ht 73.0 in | Wt 185.4 lb

## 2018-09-07 DIAGNOSIS — F331 Major depressive disorder, recurrent, moderate: Secondary | ICD-10-CM

## 2018-09-07 DIAGNOSIS — Z79899 Other long term (current) drug therapy: Secondary | ICD-10-CM

## 2018-09-07 DIAGNOSIS — N529 Male erectile dysfunction, unspecified: Secondary | ICD-10-CM

## 2018-09-07 DIAGNOSIS — M25512 Pain in left shoulder: Secondary | ICD-10-CM

## 2018-09-07 DIAGNOSIS — F988 Other specified behavioral and emotional disorders with onset usually occurring in childhood and adolescence: Secondary | ICD-10-CM

## 2018-09-07 DIAGNOSIS — M25511 Pain in right shoulder: Secondary | ICD-10-CM

## 2018-09-07 MED ORDER — AMPHETAMINE-DEXTROAMPHETAMINE 15 MG PO TABS: 15.0000 mg | ORAL_TABLET | Freq: Every day | ORAL | 0 refills | Status: DC

## 2018-09-07 MED ORDER — CYCLOBENZAPRINE HCL 10 MG PO TABS: 10.0000 mg | ORAL_TABLET | Freq: Every day | ORAL | 0 refills | Status: DC

## 2018-09-07 MED ORDER — FLUOXETINE HCL 20 MG PO TABS: 40.0000 mg | ORAL_TABLET | Freq: Every day | ORAL | 3 refills | Status: DC

## 2018-09-07 MED ORDER — NAPROXEN 500 MG PO TABS: 500.0000 mg | ORAL_TABLET | Freq: Two times a day (BID) | ORAL | 0 refills | Status: DC

## 2018-09-07 MED ORDER — SILDENAFIL CITRATE 100 MG PO TABS: 50.0000 mg | ORAL_TABLET | Freq: Every day | ORAL | 0 refills | Status: DC | PRN

## 2018-09-07 MED ORDER — GABAPENTIN 300 MG PO CAPS: ORAL_CAPSULE | ORAL | 3 refills | Status: DC

## 2018-09-07 NOTE — Patient Instructions (Signed)
° ° ° °  If you have lab work done today you will be contacted with your lab results within the next 2 weeks.  If you have not heard from us then please contact us. The fastest way to get your results is to register for My Chart. ° ° °IF you received an x-ray today, you will receive an invoice from [facility]. Please contact [facility] at [phone] with questions or concerns regarding your invoice.  ° °IF you received labwork today, you will receive an invoice from [facility]. Please contact [facility] at [phone] with questions or concerns regarding your invoice.  ° °Our billing staff will not be able to assist you with questions regarding bills from these companies. ° °You will be contacted with the lab results as soon as they are available. The fastest way to get your results is to activate your My Chart account. Instructions are located on the last page of this paperwork. If you have not heard from us regarding the results in 2 weeks, please contact this office. °  ° ° ° °

## 2018-09-07 NOTE — Progress Notes (Signed)
2/10/20204:28 PM  Louis Zimmerman Oct 23, 1960, 58 y.o. male [id number]  Chief Complaint  Patient presents with  . Medication Refill    needs aderrall, gabapentin and prozac refill for 3 months    HPI:   Patient is a 58 y.o. male with past medical history significant for ADHD, depression who presents today for routine followup  Last OV oct 2019 pmp reviewed today, appropriate, last refill 09/01/2018 Admitted in hosp nov 28-29 2019 - dx with vasculitis with AKI Anca/ana/sed rate/crp/complement/HCV/HIV negative Seen in ER Jun 27 2018 - Right abd pain - sma and celiac artery hazziness on ct angio  Patient repots no pain since past hospital visits He did not see rheum nor nephrology as he does not have medical insurance at this time Has CPE scheduled with Dr Brigitte Pulse in March, when his insurance starts again  Depression is well controlled back on 40mg  prozac Holidays are difficult, his mother recently passed Feeling much better for past 3-4 weeks  Has a new job, working with Chief Financial Officer Currently [employer], unsure if he wants to remain in that position Having lots of neck and shoulder pain, as spends most of the time with arms elevated above head Taking ibu 1200mg  BID and ice hot patches Taking gabapentin 300mg  AM and 600mg  bedtime  ADD is well controlled on current dose  In a new relationship Having concerns regarding ED Requesting viagra   Fall Risk  05/28/2018 03/28/2018 02/19/2018 12/11/2017 11/10/2017  Falls in the past year? No No No No No     Depression screen [facility] 2/9 05/28/2018 03/28/2018 02/19/2018  Decreased Interest 0 0 0  Down, Depressed, Hopeless 0 0 1  PHQ - 2 Score 0 0 1  Altered sleeping - - -  Tired, decreased energy - - -  Change in appetite - - -  Feeling bad or failure about yourself  - - -  Trouble concentrating - - -  Moving slowly or fidgety/restless - - -  Suicidal thoughts - - -  PHQ-9 Score - - -  Difficult doing work/chores - -  -    Allergies  Allergen Reactions  . Hydrocodone     Throat swelling    Prior to Admission medications   Medication Sig Start Date End Date Taking? Authorizing Provider  acyclovir (ZOVIRAX) 400 MG tablet Take 1 tablet (400 mg total) by mouth 2 (two) times daily. Patient not taking: Reported on 06/25/2018 02/19/18   Shawnee Knapp, MD  amphetamine-dextroamphetamine (ADDERALL) 15 MG tablet Take 1 tablet by mouth daily. 07/28/18   Rutherford Guys, MD  aspirin EC 81 MG tablet Take 1 tablet (81 mg total) by mouth daily. 06/26/18   Oswald Hillock, MD  FLUoxetine (PROZAC) 20 MG tablet Take 1 tablet (20 mg total) by mouth daily. 05/28/18   Rutherford Guys, MD  gabapentin (NEURONTIN) 300 MG capsule Take 1 capsule (300 mg total) by mouth every morning AND 2 capsules (600 mg total) at bedtime. 05/28/18   Rutherford Guys, MD    Past Medical History:  Diagnosis Date  . Depression     Past Surgical History:  Procedure Laterality Date  . CHOLECYSTECTOMY    . GALLBLADDER SURGERY  2003    Social History   Tobacco Use  . Smoking status: Current Every Day Smoker    Packs/day: 2.00    Types: Cigarettes  . Smokeless tobacco: Never Used  Substance Use Topics  . Alcohol use: No    Family  History  Problem Relation Age of Onset  . Cancer Mother   . Dementia Mother   . Diabetes Mother   . Heart Problems Mother   . Liver cancer Father   . Colon cancer Father   . Alcohol abuse Maternal Grandfather   . Alcohol abuse Paternal Grandfather   . Colon polyps Sister   . Colon polyps Sister     Review of Systems  Constitutional: Negative for chills and fever.  Respiratory: Negative for cough and shortness of breath.   Cardiovascular: Negative for chest pain, palpitations and leg swelling.  Gastrointestinal: Negative for abdominal pain, nausea and vomiting.     OBJECTIVE:  Blood pressure 129/85, pulse 71, temperature 99.7 F (37.6 C), temperature source Oral, height 6\' 1"  (1.854 m),  weight 185 lb 6.4 oz (84.1 kg), SpO2 100 %. Body mass index is 24.46 kg/m.   Physical Exam Vitals signs and nursing note reviewed.  Constitutional:      Appearance: He is well-developed.  HENT:     Head: Normocephalic and atraumatic.  Eyes:     Conjunctiva/sclera: Conjunctivae normal.     Pupils: Pupils are equal, round, and reactive to light.  Neck:     Musculoskeletal: Neck supple.  Pulmonary:     Effort: Pulmonary effort is normal.  Skin:    General: Skin is warm and dry.  Neurological:     Mental Status: He is alert and oriented to person, place, and time.     ASSESSMENT and PLAN  1. Attention deficit disorder (ADD) in adult Controlled. Continue current regime.   2. Moderate episode of recurrent major depressive disorder ([name]) Controlled on prozac 40mg . Continued current regime  3. Pain of both shoulder joints Cont with supportive measures. Adding flexeril. Consider ortho referral  4. Erectile dysfunction, unspecified erectile dysfunction type rx for viagra given, reviewed r/se/b  5. Medication management - ToxASSURE Select 13 (MW), Urine  Other orders - cyclobenzaprine (FLEXERIL) 10 MG tablet; Take 1 tablet (10 mg total) by mouth at bedtime. - naproxen (NAPROSYN) 500 MG tablet; Take 1 tablet (500 mg total) by mouth 2 (two) times daily with a meal. - amphetamine-dextroamphetamine (ADDERALL) 15 MG tablet; Take 1 tablet by mouth daily before breakfast for 30 days. - amphetamine-dextroamphetamine (ADDERALL) 15 MG tablet; Take 1 tablet by mouth daily before breakfast for 30 days. - amphetamine-dextroamphetamine (ADDERALL) 15 MG tablet; Take 1 tablet by mouth daily before breakfast for 30 days. - gabapentin (NEURONTIN) 300 MG capsule; Take 1 capsule (300 mg total) by mouth every morning AND 2 capsules (600 mg total) at bedtime. - FLUoxetine (PROZAC) 20 MG tablet; Take 2 tablets (40 mg total) by mouth daily. - sildenafil (VIAGRA) 100 MG tablet; Take 0.5-1 tablets  (50-100 mg total) by mouth daily as needed for erectile dysfunction.    Return in about 3 months (around 12/06/2018).    Rutherford Guys, MD Primary Care at [place], [place] [address] Ph.  [phone] Fax [phone]

## 2018-09-11 LAB — TOXASSURE SELECT 13 (MW), URINE

## 2018-09-29 ENCOUNTER — Telehealth: Payer: Self-pay | Admitting: Family Medicine

## 2018-09-29 NOTE — Telephone Encounter (Signed)
mychart message sent to pt about their appointment with Dr Shaw °

## 2018-11-09 ENCOUNTER — Encounter: Payer: Self-pay | Admitting: Family Medicine

## 2018-11-09 ENCOUNTER — Telehealth: Payer: Self-pay | Admitting: Family Medicine

## 2018-11-09 DIAGNOSIS — Z1211 Encounter for screening for malignant neoplasm of colon: Secondary | ICD-10-CM

## 2018-11-09 NOTE — Telephone Encounter (Signed)
Pt is wanting a referral to have a colonoscopy at [facility].

## 2018-11-10 NOTE — Telephone Encounter (Signed)
Referral placed.

## 2018-12-05 DIAGNOSIS — M25512 Pain in left shoulder: Secondary | ICD-10-CM | POA: Diagnosis not present

## 2018-12-10 ENCOUNTER — Other Ambulatory Visit: Payer: Self-pay

## 2018-12-10 ENCOUNTER — Encounter: Payer: Self-pay | Admitting: Family Medicine

## 2018-12-10 ENCOUNTER — Telehealth (INDEPENDENT_AMBULATORY_CARE_PROVIDER_SITE_OTHER): Payer: BLUE CROSS/BLUE SHIELD | Admitting: Family Medicine

## 2018-12-10 ENCOUNTER — Telehealth: Payer: Self-pay | Admitting: Family Medicine

## 2018-12-10 DIAGNOSIS — F331 Major depressive disorder, recurrent, moderate: Secondary | ICD-10-CM

## 2018-12-10 DIAGNOSIS — M25512 Pain in left shoulder: Secondary | ICD-10-CM | POA: Diagnosis not present

## 2018-12-10 DIAGNOSIS — G8929 Other chronic pain: Secondary | ICD-10-CM | POA: Diagnosis not present

## 2018-12-10 DIAGNOSIS — F988 Other specified behavioral and emotional disorders with onset usually occurring in childhood and adolescence: Secondary | ICD-10-CM | POA: Diagnosis not present

## 2018-12-10 MED ORDER — FLUOXETINE HCL 20 MG PO TABS: 40.0000 mg | ORAL_TABLET | Freq: Every day | ORAL | 3 refills | Status: DC

## 2018-12-10 MED ORDER — PREDNISONE 10 MG PO TABS: ORAL_TABLET | ORAL | 0 refills | Status: AC

## 2018-12-10 MED ORDER — AMPHETAMINE-DEXTROAMPHETAMINE 15 MG PO TABS: 15.0000 mg | ORAL_TABLET | Freq: Every day | ORAL | 0 refills | Status: DC

## 2018-12-10 MED ORDER — CYCLOBENZAPRINE HCL 10 MG PO TABS: 10.0000 mg | ORAL_TABLET | Freq: Every day | ORAL | 0 refills | Status: DC

## 2018-12-10 MED ORDER — GABAPENTIN 300 MG PO CAPS: ORAL_CAPSULE | ORAL | 3 refills | Status: DC

## 2018-12-10 NOTE — Progress Notes (Signed)
Needing refills on pended meds, also having left shoulder pain, reaching at work pulled muscle in the back muscle. Says rotator cuff seems swollen. Went  To urgent care a few wks ago seeking cortisone injection, was given topical med.

## 2018-12-10 NOTE — Progress Notes (Signed)
Virtual Visit Note  I connected with patient on 12/10/18 at 823am by phone and verified that I am speaking with the correct person using two identifiers. Louis Zimmerman is currently located at home and patient is currently with them during visit. The provider, Rutherford Guys, MD is located in their office at time of visit.  I discussed the limitations, risks, security and privacy concerns of performing an evaluation and management service by telephone and the availability of in person appointments. I also discussed with the patient that there may be a patient responsible charge related to this service. The patient expressed understanding and agreed to proceed.   CC: routine followup, left shoulder pain  HPI ? Patient is a 58 y.o. male with past medical history significant for ADHD, depressionwho presents today forroutine followup  Last OV Feb 2020 UDS feb 2020 - appropriate pmp reviewed today  Reports 3-4 weeks incident when he reached over his head with left arm with twisting motion, heard a pop and with immediate swelling of muscles around shoulder blade He reports FROM with shoulder flexion and extension but decreased abduction, needs to support with right hand to bring to and above shoulder/head Having pain sharp burning along deltoid Reports dropping objects, specially with smaller objects Reports numbness/tingling of fingers Denies any neck pain Pain sign interfering with sleep Has had similar issues with this shoulder in the past Patient is right handed Works as Clinical biochemist  Seen at urgent care, given Diclofenac gel  Methocarbamol 500mg  BID These not helping Using OTC lidocaine patches and ibuprofen  Otherwise depression and ADD well controlled Continues to go to [facility]  Allergies  Allergen Reactions  . Hydrocodone     Throat swelling    Prior to Admission medications   Medication Sig Start Date End Date Taking? Authorizing Provider   amphetamine-dextroamphetamine (ADDERALL) 15 MG tablet Take 1 tablet by mouth daily before breakfast for 30 days. 11/06/18 12/06/18  Rutherford Guys, MD  aspirin EC 81 MG tablet Take 1 tablet (81 mg total) by mouth daily. 06/26/18   Oswald Hillock, MD  cyclobenzaprine (FLEXERIL) 10 MG tablet Take 1 tablet (10 mg total) by mouth at bedtime. 09/07/18   Rutherford Guys, MD  FLUoxetine (PROZAC) 20 MG tablet Take 2 tablets (40 mg total) by mouth daily. 09/07/18   Rutherford Guys, MD  gabapentin (NEURONTIN) 300 MG capsule Take 1 capsule (300 mg total) by mouth every morning AND 2 capsules (600 mg total) at bedtime. 09/07/18   Rutherford Guys, MD  methocarbamol (ROBAXIN) 500 MG tablet  12/05/18   [provider]  naproxen (NAPROSYN) 500 MG tablet Take 1 tablet (500 mg total) by mouth 2 (two) times daily with a meal. 09/07/18   Rutherford Guys, MD    Past Medical History:  Diagnosis Date  . Depression     Past Surgical History:  Procedure Laterality Date  . CHOLECYSTECTOMY    . GALLBLADDER SURGERY  2003    Social History   Tobacco Use  . Smoking status: Current Every Day Smoker    Packs/day: 2.00    Types: Cigarettes  . Smokeless tobacco: Never Used  Substance Use Topics  . Alcohol use: No    Family History  Problem Relation Age of Onset  . Cancer Mother   . Dementia Mother   . Diabetes Mother   . Heart Problems Mother   . Liver cancer Father   . Colon cancer Father   . Alcohol  abuse Maternal Grandfather   . Alcohol abuse Paternal Grandfather   . Colon polyps Sister   . Colon polyps Sister     ROS Per hpi  Objective  Vitals as reported by the patient: none   ASSESSMENT and PLAN  1. Chronic left shoulder pain Discussed RICE therapy, trial of short pred taper due to cont level of inflammation. Change back to flexeril. Avoiding opiates given h/o sub use. Work excuse for 1 week. Referring to ortho.   - Ambulatory referral to Orthopedic Surgery  2. Attention deficit  disorder (ADD) in adult Controlled. Continue current regime.   3. Moderate episode of recurrent major depressive disorder ([name]) Controlled. Continue current regime.   Other orders - amphetamine-dextroamphetamine (ADDERALL) 15 MG tablet; Take 1 tablet by mouth daily before breakfast for 30 days. - FLUoxetine (PROZAC) 20 MG tablet; Take 2 tablets (40 mg total) by mouth daily. - gabapentin (NEURONTIN) 300 MG capsule; Take 1 capsule (300 mg total) by mouth every morning AND 2 capsules (600 mg total) at bedtime. - cyclobenzaprine (FLEXERIL) 10 MG tablet; Take 1 tablet (10 mg total) by mouth at bedtime. - amphetamine-dextroamphetamine (ADDERALL) 15 MG tablet; Take 1 tablet by mouth daily before breakfast for 30 days. Of to fill 60 days after signature - amphetamine-dextroamphetamine (ADDERALL) 15 MG tablet; Take 1 tablet by mouth daily before breakfast for 30 days. Ok to fill 30 days after signature - predniSONE (DELTASONE) 10 MG tablet; Take 4 tablets (40 mg total) by mouth daily with breakfast for 1 day, THEN 3 tablets (30 mg total) daily with breakfast for 1 day, THEN 2 tablets (20 mg total) daily with breakfast for 1 day, THEN 1 tablet (10 mg total) daily with breakfast for 1 day, THEN 0.5 tablets (5 mg total) daily with breakfast for 1 day.  FOLLOW-UP: 1 week, in office   The above assessment and management plan was discussed with the patient. The patient verbalized understanding of and has agreed to the management plan. Patient is aware to call the clinic if symptoms persist or worsen. Patient is aware when to return to the clinic for a follow-up visit. Patient educated on when it is appropriate to go to the emergency department.    I provided 22 minutes of non-face-to-face time during this encounter.  Rutherford Guys, MD Primary Care at [place], [place] [address] Ph.  [phone] Fax [phone]

## 2018-12-10 NOTE — Telephone Encounter (Signed)
12/10/2018 - PATIENT HAD A TELEMED APPOINTMENT WITH DR. Benay Spice ON THURS. (12/10/2018). SHE HAS REQUESTED HE RETURN FOR A 1 WEEK RECHECK IN THE OFFICE. I TRIED TO CALL AND SCHEDULE BUT HAD TO LEAVE HIM A VOICE MAIL TO RETURN OUR CALL. [place]

## 2018-12-11 ENCOUNTER — Other Ambulatory Visit: Payer: Self-pay

## 2018-12-11 ENCOUNTER — Ambulatory Visit: Payer: Self-pay

## 2018-12-11 ENCOUNTER — Ambulatory Visit: Payer: BLUE CROSS/BLUE SHIELD | Admitting: Orthopedic Surgery

## 2018-12-11 ENCOUNTER — Encounter: Payer: Self-pay | Admitting: Orthopedic Surgery

## 2018-12-11 DIAGNOSIS — M542 Cervicalgia: Secondary | ICD-10-CM | POA: Diagnosis not present

## 2018-12-11 DIAGNOSIS — S46012A Strain of muscle(s) and tendon(s) of the rotator cuff of left shoulder, initial encounter: Secondary | ICD-10-CM

## 2018-12-11 DIAGNOSIS — M79602 Pain in left arm: Secondary | ICD-10-CM | POA: Diagnosis not present

## 2018-12-11 DIAGNOSIS — M25512 Pain in left shoulder: Secondary | ICD-10-CM | POA: Diagnosis not present

## 2018-12-11 MED ORDER — TRAMADOL HCL 50 MG PO TABS: ORAL_TABLET | ORAL | 0 refills | Status: DC

## 2018-12-11 MED ORDER — CELECOXIB 200 MG PO CAPS: ORAL_CAPSULE | ORAL | 0 refills | Status: DC

## 2018-12-11 MED ORDER — CYCLOBENZAPRINE HCL 10 MG PO TABS: ORAL_TABLET | ORAL | 0 refills | Status: DC

## 2018-12-11 NOTE — Progress Notes (Signed)
Office Visit Note   Patient: Louis Zimmerman           Date of Birth: 1961/03/25           MRN: [id number] Visit Date: 12/11/2018 Requested by: Rutherford Guys, MD [address] [place],  [address] PCP: Rutherford Guys, MD  Subjective: Chief Complaint  Patient presents with  . Left Shoulder - Pain    HPI: Louis Zimmerman is a patient with left shoulder pain.  Is been going on for about 5 weeks.  He reached for something and felt a pop in his shoulder.  Has had pain and weakness since that time.  Does report some numbness and tingling in the left arm which runs down into the hand.  He also reports left arm weakness.  He does have a history of having issues with pain medicine in the past in terms of requiring detox after a period of being on Percocet.  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              ROS: All systems reviewed are negative as they relate to the chief complaint within the history of present illness.  Patient denies  fevers or chills.   Assessment & Plan: Visit Diagnoses:  1. Left arm pain   2. Left shoulder pain, unspecified chronicity   3. Cervicalgia   4. Traumatic complete tear of left rotator cuff, initial encounter     Plan: Impression is left shoulder pain with likely rotator cuff tear.  This may involve the supraspinatus and infraspinatus.  He also has some cervical spine spondylosis at C5-6 and C6-7.  I do not know that that is really at play at this time based on examination of the shoulder.  MRI scan pending of that left shoulder in order to evaluate the size of the rotator cuff tear.  Injection performed into the subacromial space for pain relief and we also put him on Celebrex Flexeril and tramadol.  I will see him back after the scan  Follow-Up Instructions: Return for after MRI.   Orders:  Orders Placed This Encounter  Procedures  . XR Shoulder Left  . XR Cervical Spine 2 or 3 views  . MR Shoulder Left w/ contrast   . Arthrogram   Meds ordered this encounter  Medications  . celecoxib (CELEBREX) 200 MG capsule    Sig: 1 po bid    Dispense:  60 capsule    Refill:  0  . cyclobenzaprine (FLEXERIL) 10 MG tablet    Sig: 1 po q 8 hrs prn    Dispense:  30 tablet    Refill:  0  . traMADol (ULTRAM) 50 MG tablet    Sig: 1 po q 8 hrs prn pain    Dispense:  40 tablet    Refill:  0      Procedures: No procedures performed   Clinical Data: No additional findings.  Objective: Vital Signs: There were no vitals taken for this visit.  Physical Exam:   Constitutional: Patient appears well-developed HEENT:  Head: Normocephalic Eyes:EOM are normal Neck: Normal range of motion Cardiovascular: Normal rate Pulmonary/chest: Effort normal Neurologic: Patient is alert Skin: Skin is warm Psychiatric: Patient has normal mood and affect    Ortho Exam: Ortho exam demonstrates full active and passive range of motion of the cervical spine.  No definite paresthesias C5-T1.  Reflexes symmetric bilateral biceps and triceps.  Left shoulder  does have pain with passive and active range of motion.  He has fairly significant weakness to infraspinatus and supraspinatus testing on the left compared to the right.  Subscap strength symmetric bilaterally.  Motor or sensory function to the hand intact bilaterally.  No discrete AC joint tenderness left versus right.  Specialty Comments:  No specialty comments available.  Imaging: Xr Cervical Spine 2 Or 3 Views  Result Date: 12/11/2018 AP lateral cervical spine reviewed.  Degenerative disc disease is present at C5-6 but normal lordosis is maintained.  Minimal to no facet arthritis is present.  Longitudinal wire near the hyoid bone.  Unknown significance.   Xr Shoulder Left  Result Date: 12/11/2018 AP outlet axillary left shoulder reviewed.  AC joint arthritis is present.  Acromiohumeral distance normal.  Shoulder is located.  Visualized lung fields clear.  Small cystic  change noted in the humeral head but not consistent with AVN.    PMFS History: Patient Active Problem List   Diagnosis Date Noted  . Vasculitis ([place]) 06/26/2018  . AKI (acute kidney injury) ([place]) 06/26/2018  . 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 Attention deficit disorder (ADD) in adult 11/10/2017  . Moderate episode of recurrent major depressive disorder ([place]) 08/31/2017  . 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 Disturbed concentration 08/31/2017  . Tobacco use disorder 08/31/2017  . History of substance abuse ([place]) 02/23/2017  . Depression 02/02/2013   Past Medical History:  Diagnosis Date  . Depression     Family History  Problem Relation Age of Onset  . Cancer Mother   . Dementia Mother   . Diabetes Mother   . Heart Problems Mother   . Liver cancer Father   . Colon cancer Father   . Alcohol abuse Maternal Grandfather   . Alcohol abuse Paternal Grandfather   . Colon polyps Sister   . Colon polyps Sister     Past Surgical History:  Procedure Laterality Date  . CHOLECYSTECTOMY    . GALLBLADDER SURGERY  2003   Social History   Occupational History  . Not on file  Tobacco Use  . Smoking status: Current Every Day Smoker    Packs/day: 2.00    Types: Cigarettes  . Smokeless tobacco: Never Used  Substance and Sexual Activity  . Alcohol use: No  . Drug use: No    Frequency: 7.0 times per week    Types: IV  . Sexual activity: Not on file

## 2018-12-18 ENCOUNTER — Other Ambulatory Visit: Payer: Self-pay

## 2018-12-18 ENCOUNTER — Ambulatory Visit: Payer: BLUE CROSS/BLUE SHIELD | Admitting: Family Medicine

## 2018-12-18 VITALS — BP 129/78 | HR 81 | Temp 97.8°F | Ht 73.0 in | Wt 190.0 lb

## 2018-12-18 DIAGNOSIS — Z1211 Encounter for screening for malignant neoplasm of colon: Secondary | ICD-10-CM | POA: Diagnosis not present

## 2018-12-18 DIAGNOSIS — Z800 Family history of malignant neoplasm of digestive organs: Secondary | ICD-10-CM

## 2018-12-18 DIAGNOSIS — K148 Other diseases of tongue: Secondary | ICD-10-CM | POA: Diagnosis not present

## 2018-12-18 DIAGNOSIS — M25512 Pain in left shoulder: Secondary | ICD-10-CM | POA: Diagnosis not present

## 2018-12-18 DIAGNOSIS — G8929 Other chronic pain: Secondary | ICD-10-CM

## 2018-12-18 NOTE — Progress Notes (Signed)
5/22/20205:16 PM  Louis Zimmerman Oct 26, 1960, 58 y.o., male [id number]  Chief Complaint  Patient presents with  . Shoulder Pain    follow up on left shoulder pain, has mri scheduled for 6/22. Shoulder feels tender, he being conscience of movement. Not taking the tramadol, did not notice diffrence when taken med. Now taking motrin for the pain    HPI:   Patient is a 58 y.o. male with past medical history significant for ADHD, depression who presents today for left shoulder pain  televisit 2 weeks ago Referred to ortho, saw dr dean on 12/11/2018 mri pending for concern of rotator cuff tear Started flexeril, celebrex and tramadol Had steroid injection Shoulder doing better Not needing tramadol Ready to go back to work  He is also concerned about a white lesion on his tongue Has been present for about 3-4 months Feels sore, numb Has been applying oragel which seems to exfoliate it a bit Denies any trauma Denies any swollen lymph nodes Smokes  He is requesting referral for colonoscopy Last one more than 5 years ago His father had colon cancer Patient denies any weight loss, changes in stool  Fall Risk  12/18/2018 12/10/2018 05/28/2018 03/28/2018 02/19/2018  Falls in the past year? 0 0 No No No  Number falls in past yr: 0 0 - - -  Injury with Fall? 0 0 - - -     Depression screen [facility] 2/9 12/18/2018 12/10/2018 05/28/2018  Decreased Interest 0 0 0  Down, Depressed, Hopeless 0 0 0  PHQ - 2 Score 0 0 0  Altered sleeping - - -  Tired, decreased energy - - -  Change in appetite - - -  Feeling bad or failure about yourself  - - -  Trouble concentrating - - -  Moving slowly or fidgety/restless - - -  Suicidal thoughts - - -  PHQ-9 Score - - -  Difficult doing work/chores - - -    Allergies  Allergen Reactions  . Hydrocodone     Throat swelling    Prior to Admission medications   Medication Sig Start Date End Date Taking? Authorizing Provider   amphetamine-dextroamphetamine (ADDERALL) 15 MG tablet Take 1 tablet by mouth daily before breakfast for 30 days. 12/10/18 01/09/19 Yes Rutherford Guys, MD  amphetamine-dextroamphetamine (ADDERALL) 15 MG tablet Take 1 tablet by mouth daily before breakfast for 30 days. Of to fill 60 days after signature 12/10/18 01/09/19 Yes Rutherford Guys, MD  amphetamine-dextroamphetamine (ADDERALL) 15 MG tablet Take 1 tablet by mouth daily before breakfast for 30 days. Ok to fill 30 days after signature 12/10/18 01/09/19 Yes Rutherford Guys, MD  aspirin EC 81 MG tablet Take 1 tablet (81 mg total) by mouth daily. 06/26/18  Yes Oswald Hillock, MD  celecoxib (CELEBREX) 200 MG capsule 1 po bid 12/11/18  Yes Dean, Tonna Corner, MD  cyclobenzaprine (FLEXERIL) 10 MG tablet Take 1 tablet (10 mg total) by mouth at bedtime. 12/10/18  Yes Rutherford Guys, MD  cyclobenzaprine (FLEXERIL) 10 MG tablet 1 po q 8 hrs prn 12/11/18  Yes Dean, Tonna Corner, MD  FLUoxetine (PROZAC) 20 MG tablet Take 2 tablets (40 mg total) by mouth daily. 12/10/18  Yes Rutherford Guys, MD  gabapentin (NEURONTIN) 300 MG capsule Take 1 capsule (300 mg total) by mouth every morning AND 2 capsules (600 mg total) at bedtime. 12/10/18  Yes Rutherford Guys, MD  naproxen (NAPROSYN) 500 MG tablet Take 1 tablet (500 mg  total) by mouth 2 (two) times daily with a meal. 09/07/18  Yes Rutherford Guys, MD    Past Medical History:  Diagnosis Date  . Depression     Past Surgical History:  Procedure Laterality Date  . CHOLECYSTECTOMY    . GALLBLADDER SURGERY  2003    Social History   Tobacco Use  . Smoking status: Current Every Day Smoker    Packs/day: 2.00    Types: Cigarettes  . Smokeless tobacco: Never Used  Substance Use Topics  . Alcohol use: No    Family History  Problem Relation Age of Onset  . Cancer Mother   . Dementia Mother   . Diabetes Mother   . Heart Problems Mother   . Liver cancer Father   . Colon cancer Father   . Alcohol abuse  Maternal Grandfather   . Alcohol abuse Paternal Grandfather   . Colon polyps Sister   . Colon polyps Sister     ROS Per hpi  OBJECTIVE:  Today's Vitals   12/18/18 1635  BP: 129/78  Pulse: 81  Temp: 97.8 F (36.6 C)  TempSrc: Oral  SpO2: 97%  Weight: 190 lb (86.2 kg)  Height: 6\' 1"  (1.854 m)   Body mass index is 25.07 kg/m.   Physical Exam Vitals signs and nursing note reviewed.  Constitutional:      Appearance: He is well-developed.  HENT:     Head: Normocephalic and atraumatic.     Mouth/Throat:     Tongue: Lesions (white raised lesion in middle of tongue) present.  Eyes:     Conjunctiva/sclera: Conjunctivae normal.     Pupils: Pupils are equal, round, and reactive to light.  Neck:     Musculoskeletal: Neck supple.  Pulmonary:     Effort: Pulmonary effort is normal.  Lymphadenopathy:     Cervical: No cervical adenopathy.  Skin:    General: Skin is warm and dry.  Neurological:     Mental Status: He is alert and oriented to person, place, and time.       ASSESSMENT and PLAN  1. Screen for colon cancer 2. FHx: colon cancer - Ambulatory referral to Gastroenterology  3. Tongue lesion - Ambulatory referral to ENT  4. Chronic left shoulder pain Improved after injection. Return to work letter given. Managed by ortho.  Return for after ent's workup.    Rutherford Guys, MD Primary Care at [place], [place] [address] Ph.  [phone] Fax [phone]

## 2018-12-21 ENCOUNTER — Encounter: Payer: Self-pay | Admitting: Family Medicine

## 2018-12-31 ENCOUNTER — Encounter: Payer: Self-pay | Admitting: Family Medicine

## 2018-12-31 ENCOUNTER — Telehealth: Payer: Self-pay

## 2018-12-31 ENCOUNTER — Telehealth: Payer: Self-pay | Admitting: Family Medicine

## 2018-12-31 NOTE — Telephone Encounter (Signed)
Copied from [place] [id number]. Topic: Quick Communication - See Telephone Encounter >> Dec 31, 2018 12:46 PM Rutherford Nail, NT wrote: CRM for notification. See Telephone encounter for: 12/31/18. Lennette Bihari with [facility] calling and states that the patient is there with an adderall prescription that Dr Pamella Pert had written back on 12/10/2018. States that that the last time the patient filled the amphetamine-dextroamphetamine (ADDERALL) 15 MG tablet prescription was on 11/30/2018. States that there is a not on the prescription that says it is okay to fill rx 60 days after signature. Would like to know if it is okay to fill this medication today?  CB#: [phone]

## 2018-12-31 NOTE — Telephone Encounter (Signed)
Dr Santiago advise please 

## 2018-12-31 NOTE — Telephone Encounter (Signed)
Pt says adderall was sent on the 4th of may. Our records is showing the 14th of May. He is at the pharmacy trying to get meds right now.

## 2019-01-01 ENCOUNTER — Telehealth: Payer: Self-pay | Admitting: Family Medicine

## 2019-01-01 NOTE — Telephone Encounter (Signed)
Ok to refill today, per pmp last filled may 4th 2020 Pharmacy notified

## 2019-01-01 NOTE — Telephone Encounter (Signed)
Medication has already been approved, I have spoke with pharmacy about concerns.

## 2019-01-01 NOTE — Telephone Encounter (Signed)
Patient's concern/request has been addressed already. 

## 2019-01-01 NOTE — Telephone Encounter (Signed)
Copied from [place] [phone]. Topic: Quick Communication - See Telephone Encounter >> Dec 31, 2018 12:46 PM Rutherford Nail, NT wrote: CRM for notification. See Telephone encounter for: 12/31/18. Lennette Bihari with [facility] calling and states that the patient is there with an adderall prescription that Dr Pamella Pert had written back on 12/10/2018. States that that the last time the patient filled the amphetamine-dextroamphetamine (ADDERALL) 15 MG tablet prescription was on 11/30/2018. States that there is a not on the prescription that says it is okay to fill rx 60 days after signature. Would like to know if it is okay to fill this medication today?  CB#: [phone] >> Jan 01, 2019  9:33 AM Leward Quan A wrote: Patient called to say that there was a mix up on the date of his Rx for amphetamine-dextroamphetamine (ADDERALL) 15 MG tablet at the [facility] and due to this issue he has been without his medication for 3 days. Patient states that last Rx was filled on 11/30/2018 but not entered by [facility] until 12/10/2018 and due to this his date has been altered and he is in need of his medication. Patient need Dr Pamella Pert to approve his refill so that [facility] can go ahead and fill it right away. Please advise. And contact patient to let him know

## 2019-01-13 ENCOUNTER — Telehealth: Payer: Self-pay | Admitting: Family Medicine

## 2019-01-13 MED ORDER — AMPHETAMINE-DEXTROAMPHETAMINE 15 MG PO TABS: 15.0000 mg | ORAL_TABLET | Freq: Every day | ORAL | 0 refills | Status: DC

## 2019-01-13 MED ORDER — GABAPENTIN 300 MG PO CAPS: ORAL_CAPSULE | ORAL | 3 refills | Status: DC

## 2019-01-13 MED ORDER — FLUOXETINE HCL 20 MG PO TABS: 40.0000 mg | ORAL_TABLET | Freq: Every day | ORAL | 3 refills | Status: DC

## 2019-01-13 NOTE — Telephone Encounter (Signed)
Patient needs one more refill before his appointment on July 21   Thank you

## 2019-01-13 NOTE — Addendum Note (Signed)
Addended by: Rutherford Guys on: 01/13/2019 01:49 PM   Modules accepted: Orders

## 2019-01-13 NOTE — Telephone Encounter (Signed)
Pt has an appt 7/21, asking for meds to be refilled for gabapentin, fluoxetine and adderall

## 2019-01-14 ENCOUNTER — Encounter: Payer: Self-pay | Admitting: Family Medicine

## 2019-01-18 ENCOUNTER — Other Ambulatory Visit: Payer: Self-pay

## 2019-01-18 ENCOUNTER — Ambulatory Visit
Admission: RE | Admit: 2019-01-18 | Discharge: 2019-01-18 | Disposition: A | Discharge status: Home / Self Care | Payer: BC Managed Care – PPO | Source: Ambulatory Visit | Attending: Orthopedic Surgery | Admitting: Orthopedic Surgery

## 2019-01-18 DIAGNOSIS — M25512 Pain in left shoulder: Secondary | ICD-10-CM

## 2019-01-18 DIAGNOSIS — M75122 Complete rotator cuff tear or rupture of left shoulder, not specified as traumatic: Secondary | ICD-10-CM | POA: Diagnosis not present

## 2019-01-18 MED ORDER — IOPAMIDOL (ISOVUE-M 200) INJECTION 41%
13.0000 mL | Freq: Once | INTRAMUSCULAR | Status: AC
  Administered 2019-01-18: 13 mL via INTRA_ARTICULAR

## 2019-01-22 ENCOUNTER — Other Ambulatory Visit: Payer: Self-pay

## 2019-01-22 ENCOUNTER — Telehealth: Payer: Self-pay | Admitting: Family Medicine

## 2019-01-22 ENCOUNTER — Ambulatory Visit (INDEPENDENT_AMBULATORY_CARE_PROVIDER_SITE_OTHER): Payer: BC Managed Care – PPO | Admitting: Orthopedic Surgery

## 2019-01-22 ENCOUNTER — Encounter: Payer: Self-pay | Admitting: Orthopedic Surgery

## 2019-01-22 DIAGNOSIS — S46012A Strain of muscle(s) and tendon(s) of the rotator cuff of left shoulder, initial encounter: Secondary | ICD-10-CM | POA: Diagnosis not present

## 2019-01-22 NOTE — Progress Notes (Signed)
Office Visit Note   Patient: Louis Zimmerman           Date of Birth: 1961/04/22           MRN: [id number] Visit Date: 01/22/2019 Requested by: Rutherford Guys, MD [address] [place],  [place] [address] PCP: Rutherford Guys, MD  Subjective: No chief complaint on file.   HPI: Louis Zimmerman is a patient with left shoulder pain.  Had an injury about 5 weeks ago.  Was having mild pain before that.  He is a smoker.  He was throwing a heavy trash bag at work on 11/09/2018.  Felt a pop in his shoulder and had immediate pain and weakness.  Is been having trouble with the arm since that work injury.  MRI scan has been performed.  He does have retracted tears of the infraspinatus and supraspinatus to the glenoid rim but not much muscle atrophy.  This is consistent with his described timeline of his tear over 2 months ago.              ROS: All systems reviewed are negative as they relate to the chief complaint within the history of present illness.  Patient denies  fevers or chills.   Assessment & Plan: Visit Diagnoses:  1. Traumatic complete tear of left rotator cuff, initial encounter     Plan: Impression is significant rotator cuff tear with retraction of the tendons infraspinatus and supraspinatus to the glenoid rim.  He does have weakness as a primary complaint as opposed to pain.  I think that based on his relatively young age his best option for return of function to the shoulder is lower trapezius transfer with allograft.  Superior capsular reconstruction would be another consideration but strength and loss of function is his main complaint as opposed to pain.  I described that in detail to him.  I think there is a very small chance that at the time of surgery we may be able to actually repair that tendon but the retraction amount is significant and I would give that less than 5% chance.  Patient wants to consider his options and he will call me back if he wants to schedule.  I did discuss with  him the time out of work in the necessity of staying in a brace for about 6 weeks until that tendon heals to The bone on the humeral side and the tendon itself of the lower trapezius in the back.    Follow-Up Instructions: Return if symptoms worsen or fail to improve.   Orders:  No orders of the defined types were placed in this encounter.  No orders of the defined types were placed in this encounter.     Procedures: No procedures performed   Clinical Data: No additional findings.  Objective: Vital Signs: There were no vitals taken for this visit.  Physical Exam:  Constitutional: Patient appears well-developed HEENT:  Head: Normocephalic Eyes:EOM are normal Neck: Normal range of motion Cardiovascular: Normal rate Pulmonary/chest: Effort normal Neurologic: Patient is alert Skin: Skin is warm Psychiatric: Patient has normal mood and affect   Ortho Exam: Ortho exam demonstrates full active and passive range of motion of that right arm.  On the left-hand side he has definite infraspinatus weakness but forward flexion and abduction both about 90 degrees.  It is difficult for him to do that.  He is got good motor sensory function of the left hand.  Radial pulses intact.  Neck range of motion  is full.  Deltoid is functional.  Specialty Comments:  No specialty comments available.  Imaging: No results found.   PMFS History: Patient Active Problem List   Diagnosis Date Noted  . FHx: colon cancer 12/18/2018  . 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 AKI (acute kidney injury) ([place]) 06/26/2018  . 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 Attention deficit disorder (ADD) in adult 11/10/2017  . Moderate episode of recurrent major depressive disorder ([place]) 08/31/2017  . 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 Disturbed concentration 08/31/2017  . Tobacco use disorder 08/31/2017  . History of substance abuse ([place]) 02/23/2017  . Depression 02/02/2013   Past Medical History:  Diagnosis  Date  . Depression     Family History  Problem Relation Age of Onset  . Cancer Mother   . Dementia Mother   . Diabetes Mother   . Heart Problems Mother   . Liver cancer Father   . Colon cancer Father   . Alcohol abuse Maternal Grandfather   . Alcohol abuse Paternal Grandfather   . Colon polyps Sister   . Colon polyps Sister     Past Surgical History:  Procedure Laterality Date  . CHOLECYSTECTOMY    . GALLBLADDER SURGERY  2003   Social History   Occupational History  . Not on file  Tobacco Use  . Smoking status: Current Every Day Smoker    Packs/day: 2.00    Types: Cigarettes  . Smokeless tobacco: Never Used  Substance and Sexual Activity  . Alcohol use: No  . Drug use: No    Frequency: 7.0 times per week    Types: IV  . Sexual activity: Not on file

## 2019-01-22 NOTE — Telephone Encounter (Signed)
Pt is needing Adderral that was supposed to be filled ont the 4th please call patient about his refills [phone]

## 2019-01-28 MED ORDER — AMPHETAMINE-DEXTROAMPHETAMINE 15 MG PO TABS: 15.0000 mg | ORAL_TABLET | Freq: Every day | ORAL | 0 refills | Status: DC

## 2019-01-28 NOTE — Telephone Encounter (Signed)
Pt needs refills

## 2019-01-28 NOTE — Telephone Encounter (Signed)
pmp reviewed meds refilled 

## 2019-02-04 ENCOUNTER — Telehealth: Payer: Self-pay | Admitting: Orthopedic Surgery

## 2019-02-04 NOTE — Telephone Encounter (Signed)
Patient is scheduled for left shoulder surgery on 02-16-19 at [facility].  He would like to discuss post operative pain management  prior to surgery date.  Please call  [phone].

## 2019-02-04 NOTE — Telephone Encounter (Signed)
Please call patient and advise. Thanks.

## 2019-02-09 NOTE — Telephone Encounter (Signed)
I called and left message on his machine.  Can you see if he is having any luck getting that brace from [facility] thanks

## 2019-02-09 NOTE — Pre-Procedure Instructions (Signed)
Louis Zimmerman  02/09/2019      [facility] [address], [place] [address] [place] [address] Phone: [phone] Fax: [phone]    Your procedure is scheduled on Tues., February 16, 2019 from 11:36AM-2:36PM  Report to [facility] Entrance "A" at 9:35AM  Call this number if you have problems the morning of surgery:  [phone]   Remember:  Do not eat after midnight on July 20th  You may drink clear liquids until 3 hours (8:35AM) prior to surgery time.  Clear liquids allowed are:  Water, Juice (non-citric and without pulp), Carbonated beverages, Clear Tea, Black Coffee only, Plain Jell-O only, Gatorade and Plain Popsicles only   Please complete your PRE-SURGERY ENSURE that was provided to you by (8:35AM) the morning of surgery.  Please, if able, drink it in one setting. DO NOT SIP.     Take these medicines the morning of surgery with A SIP OF WATER: FLUoxetine (PROZAC)   As of today, stop taking all Aspirin (unless instructed by your doctor) and Other Aspirin containing products, Vitamins, Fish oils, and Herbal medications. Also stop all NSAIDS i.e. Advil, Ibuprofen, Motrin, Aleve, Anaprox, Naproxen, BC, Goody Powders, and all Supplements.    Special instructions: [place]- Preparing For Surgery  Before surgery, you can play an important role. Because skin is not sterile, your skin needs to be as free of germs as possible. You can reduce the number of germs on your skin by washing with CHG (chlorahexidine gluconate) Soap before surgery.  CHG is an antiseptic cleaner which kills germs and bonds with the skin to continue killing germs even after washing.    Please do not use if you have an allergy to CHG or antibacterial soaps. If your skin becomes reddened/irritated stop using the CHG.  Do not shave (including legs and underarms) for at least 48 hours prior to first CHG shower. It is OK to shave your face.  Please  follow these instructions carefully.   1. Shower the NIGHT BEFORE SURGERY and the MORNING OF SURGERY with CHG.   2. If you chose to wash your hair, wash your hair first as usual with your normal shampoo.  3. After you shampoo, rinse your hair and body thoroughly to remove the shampoo.  4. Use CHG as you would any other liquid soap. You can apply CHG directly to the skin and wash gently with a scrungie or a clean washcloth.   5. Apply the CHG Soap to your body ONLY FROM THE NECK DOWN.  Do not use on open wounds or open sores. Avoid contact with your eyes, ears, mouth and genitals (private parts). Wash Face and genitals (private parts)  with your normal soap.  6. Wash thoroughly, paying special attention to the area where your surgery will be performed.  7. Thoroughly rinse your body with warm water from the neck down.  8. DO NOT shower/wash with your normal soap after using and rinsing off the CHG Soap.  9. Pat yourself dry with a CLEAN TOWEL.  10. Wear CLEAN PAJAMAS to bed the night before surgery, wear comfortable clothes the morning of surgery  11. Place CLEAN SHEETS on your bed the night of your first shower and DO NOT SLEEP WITH PETS.   Day of Surgery:  Oral Hygiene is also important to reduce your risk of infection.  Remember - BRUSH YOUR TEETH THE MORNING OF SURGERY WITH YOUR REGULAR TOOTHPASTE  Do not wear jewelry.  Do not wear lotions, powders, colognes, or deodorant.  Do not shave 48 hours prior to surgery.  Men may shave face.  Do not bring valuables to the hospital.  [facility] is not responsible for any belongings or valuables.  Contacts, dentures or bridgework may not be worn into surgery.    For patients admitted to the hospital, discharge time will be determined by your treatment team.  Patients discharged the day of surgery will not be allowed to drive home.   Please wear clean clothes to the hospital/surgery center.     Please read over the following  fact sheets that you were given. Pain Booklet, Coughing and Deep Breathing and Surgical Site Infection Prevention

## 2019-02-09 NOTE — Telephone Encounter (Signed)
IC LM for patient on VM asking him to please call and let us know if he received brace from [facility].

## 2019-02-10 ENCOUNTER — Other Ambulatory Visit: Payer: Self-pay

## 2019-02-10 ENCOUNTER — Encounter (HOSPITAL_COMMUNITY): Payer: Self-pay

## 2019-02-10 ENCOUNTER — Encounter (HOSPITAL_COMMUNITY)
Admission: RE | Admit: 2019-02-10 | Discharge: 2019-02-10 | Disposition: A | Discharge status: Home / Self Care | Payer: BC Managed Care – PPO | Source: Ambulatory Visit | Attending: Orthopedic Surgery | Admitting: Orthopedic Surgery

## 2019-02-10 DIAGNOSIS — Z01812 Encounter for preprocedural laboratory examination: Secondary | ICD-10-CM | POA: Diagnosis not present

## 2019-02-10 DIAGNOSIS — Z87891 Personal history of nicotine dependence: Secondary | ICD-10-CM | POA: Diagnosis not present

## 2019-02-10 DIAGNOSIS — M75122 Complete rotator cuff tear or rupture of left shoulder, not specified as traumatic: Secondary | ICD-10-CM | POA: Diagnosis not present

## 2019-02-10 HISTORY — DX: Other specified behavioral and emotional disorders with onset usually occurring in childhood and adolescence: F98.8

## 2019-02-10 LAB — COMPREHENSIVE METABOLIC PANEL
ALT: 26 U/L (ref 0–44)
AST: 26 U/L (ref 15–41)
Albumin: 3.7 g/dL (ref 3.5–5.0)
Alkaline Phosphatase: 74 U/L (ref 38–126)
Anion gap: 11 (ref 5–15)
BUN: 8 mg/dL (ref 6–20)
CO2: 21 mmol/L — ABNORMAL LOW (ref 22–32)
Calcium: 9.4 mg/dL (ref 8.9–10.3)
Chloride: 105 mmol/L (ref 98–111)
Creatinine, Ser: 1.05 mg/dL (ref 0.61–1.24)
GFR calc Af Amer: >60 mL/min (ref >60)
GFR calc non Af Amer: >60 mL/min (ref >60)
Glucose, Bld: 85 mg/dL (ref 70–99)
Potassium: 4.2 mmol/L (ref 3.5–5.1)
Sodium: 137 mmol/L (ref 135–145)
Total Bilirubin: 0.6 mg/dL (ref 0.3–1.2)
Total Protein: 6.5 g/dL (ref 6.5–8.1)

## 2019-02-10 LAB — CBC
HCT: 44.6 % (ref 39.0–52.0)
Hemoglobin: 15.3 g/dL (ref 13.0–17.0)
MCH: 31.3 pg (ref 26.0–34.0)
MCHC: 34.3 g/dL (ref 30.0–36.0)
MCV: 91.2 fL (ref 80.0–100.0)
Platelets: 362 10*3/uL (ref 150–400)
RBC: 4.89 MIL/uL (ref 4.22–5.81)
RDW: 13.8 % (ref 11.5–15.5)
WBC: 8.7 10*3/uL (ref 4.0–10.5)
nRBC: 0 % (ref 0.0–0.2)

## 2019-02-10 NOTE — Progress Notes (Signed)
PCP - Dr. Pamella Pert Cardiologist - denies  Chest x-ray - 06/26/18 EKG - 06/26/18 Stress Test - denies ECHO - 06/26/18 Cardiac Cath - denies  Sleep Study - denies  Aspirin Instructions: Patient instructed to hold all Aspirin, NSAID's, herbal medications, fish oil and vitamins 7 days prior to surgery.   Anesthesia review: pt with history of opioid dependency, concerns regarding surgery and pain management. Discussed at length with patient, advised I would send his chart for review and if necessary would be discussed with anesthesiologist prior to surgery date. Pt aware he will be able to discuss with his anesthesiologist DOS.  Patient denies shortness of breath, fever, cough and chest pain at PAT appointment   Patient verbalized understanding of instructions that were given to them at the PAT appointment. Patient was also instructed that they will need to review over the PAT instructions again at home before surgery.

## 2019-02-10 NOTE — Anesthesia Preprocedure Evaluation (Addendum)
Anesthesia Evaluation  Patient identified by MRN, date of birth, ID band Patient awake    Reviewed: Allergy & Precautions, NPO status , Patient's Chart, lab work & pertinent test results  Airway Mallampati: II  TM Distance: >3 FB Neck ROM: Full    Dental  (+) Partial Upper, Partial Lower   Pulmonary Current Smoker,    breath sounds clear to auscultation       Cardiovascular  Rhythm:Regular Rate:Normal     Neuro/Psych    GI/Hepatic   Endo/Other    Renal/GU      Musculoskeletal   Abdominal   Peds  Hematology   Anesthesia Other Findings   Reproductive/Obstetrics                            Anesthesia Physical Anesthesia Plan  ASA: III  Anesthesia Plan: General   Post-op Pain Management:    Induction: Intravenous  PONV Risk Score and Plan: Ondansetron and Dexamethasone  Airway Management Planned: Oral ETT  Additional Equipment:   Intra-op Plan:   Post-operative Plan: Extubation in OR  Informed Consent: I have reviewed the patients History and Physical, chart, labs and discussed the procedure including the risks, benefits and alternatives for the proposed anesthesia with the patient or authorized representative who has indicated his/her understanding and acceptance.     Dental advisory given  Plan Discussed with: CRNA and Anesthesiologist  Anesthesia Plan Comments: (See PAT note written by Myra Gianotti, PA-C. He is worried about perioperative pain management due to prior history of opioid dependence (heroin detox in 2014). )     Anesthesia Quick Evaluation

## 2019-02-10 NOTE — Progress Notes (Addendum)
Anesthesia Chart Review:  Case: [id number] Date/Time: 02/16/19 1121   Procedure: LEFT SHOULDER ARTHROSCOPY LOWER TRAPEZIUS TENDON TRANSFER, BICEPS TENODESIS (Left )   Anesthesia type: General   Pre-op diagnosis: left shouldercomplete rotator cuff tear, initial encounter   Location: [facility] OR ROOM 06 / [facility]   Surgeon: Meredith Pel, MD      DISCUSSION: Patient is a 58 year old male scheduled for the above procedure.  History includes smoking, ADD, depression, history of opioid dependency (heroin detox 2014).  - Admitted 06/25/18-06/26/18 for abdominal pain. CT renal protocol could not rule out possible SMA dissection, so CTA chest/abd/pelvis was done and showed perivascular haziness about the proximal SMA and celiac artery suspicious for vasculitis, but no dissection with patent vessels.  He was started on IV Solu-Medrol.  He had normal sed rate and CRP.  Vascular surgeon Servando Snare, MD consulted. Given normal inflammatory markers, Dr. Donzetta Matters felt it was unlikely vasculitis, but recommended out-patient rheumatology evaluation, baby aspirin, and smoking cessation.  No vascular surgery intervention or follow-up felt necessary. Patient came back to the ED on 06/27/18 with recurrent pain. CTA findings stable. No new recommendations per vascular surgery. Pain improved in ED. ED provider questioned possible gastritis or duodenitis. Discharged home with recommendation for GI and rheumatology follow-up. (He did not have insurance so did not see GI or rheumatology.  He did follow-up with primary care in February 2020.)  Patient has concerns about post-operative pain medication. It appears he has reached out to Dr. [name] to discuss this and can also discuss any questions about more immediate post-operative pain medications with his assigned anesthesiologist on the day of surgery.   Presurgical COVID-19 test is scheduled for 02/12/2019. (UPDATE 02/15/19 9:27 AM: COVID test negative. Virtual visit with PCP on  02/11/19. She is aware of surgery plans. If no acute changes then I would anticipate that he can proceed as planned.)   VS: BP 140/71   Pulse 83   Temp (!) 36 C (Oral)   Resp 17   Ht 6\' 1"  (1.854 m)   Wt 86 kg   SpO2 100%   BMI 25.00 kg/m    PROVIDERS: Rutherford Guys, MD is PCP. He was referred to ENT Rea College, MD in 11/2018 for evaluation of white tongue lesion.   LABS: Labs reviewed: Acceptable for surgery. (all labs ordered are listed, but only abnormal results are displayed)  Labs Reviewed  COMPREHENSIVE METABOLIC PANEL - Abnormal; Notable for the following components:      Result Value   CO2 21 (*)    All other components within normal limits  CBC     IMAGES: MRI left shoulder 01/18/19: IMPRESSION: Complete supraspinatus and infraspinatus tendon tears with 4-5 cm of retraction but no atrophy. Long head of biceps tendinopathy without tear. Bulky acromioclavicular osteoarthritis.  CXR 06/26/18: FINDINGS: The heart size and mediastinal contours are within normal limits. Both lungs are clear. The visualized skeletal structures are unremarkable. IMPRESSION: No active cardiopulmonary disease.   EKG: 06/26/18: Sinus rhythm Left anterior fascicular block No significant change since last tracing [02/14/13] Confirmed by Ward, Cyril Mourning [phone]) on 06/26/2018 1:40:57 AM   CV: Echo 06/26/18: Study Conclusions - Left ventricle: The cavity size was normal. Wall thickness was   increased in a pattern of moderate LVH. Systolic function was   vigorous. The estimated ejection fraction was in the range of 65%   to 70%. Wall motion was normal; there were no regional wall   motion abnormalities. Doppler parameters are  consistent with   abnormal left ventricular relaxation (grade 1 diastolic   dysfunction). The E/e&'&' ratio is <8, suggesting normal LV filling   pressure. - Left atrium: The atrium was normal in size. - Inferior vena cava: The vessel was normal in size.  The   respirophasic diameter changes were in the normal range (= 50%),   consistent with normal central venous pressure. Impressions: - LVEF 65-70%, moderate LVH, normal wall motion, grade 1 DD, normal   LV filling pressure, normal LA size, normal IVC.   Past Medical History:  Diagnosis Date  . ADD (attention deficit disorder)   . Depression     Past Surgical History:  Procedure Laterality Date  . CHOLECYSTECTOMY    . GALLBLADDER SURGERY  2003  . SKIN BIOPSY  2019    MEDICATIONS: . amphetamine-dextroamphetamine (ADDERALL) 15 MG tablet  . amphetamine-dextroamphetamine (ADDERALL) 15 MG tablet  . amphetamine-dextroamphetamine (ADDERALL) 15 MG tablet  . aspirin EC 81 MG tablet  . celecoxib (CELEBREX) 200 MG capsule  . cyclobenzaprine (FLEXERIL) 10 MG tablet  . cyclobenzaprine (FLEXERIL) 10 MG tablet  . diclofenac sodium (VOLTAREN) 1 % GEL  . FLUoxetine (PROZAC) 20 MG tablet  . gabapentin (NEURONTIN) 300 MG capsule  . Menthol (ICY HOT) 5 % PTCH  . naproxen (NAPROSYN) 500 MG tablet   No current facility-administered medications for this encounter.   Holding ASA 7 days prior to surgery.    Myra Gianotti, PA-C Surgical Short Stay/Anesthesiology [facility] Phone [phone] [facility] Phone [phone] 02/10/2019 3:11 PM

## 2019-02-11 ENCOUNTER — Telehealth: Payer: Self-pay

## 2019-02-11 ENCOUNTER — Telehealth (INDEPENDENT_AMBULATORY_CARE_PROVIDER_SITE_OTHER): Payer: BC Managed Care – PPO | Admitting: Family Medicine

## 2019-02-11 DIAGNOSIS — F331 Major depressive disorder, recurrent, moderate: Secondary | ICD-10-CM

## 2019-02-11 DIAGNOSIS — Z79899 Other long term (current) drug therapy: Secondary | ICD-10-CM

## 2019-02-11 DIAGNOSIS — F988 Other specified behavioral and emotional disorders with onset usually occurring in childhood and adolescence: Secondary | ICD-10-CM

## 2019-02-11 MED ORDER — AMPHETAMINE-DEXTROAMPHETAMINE 15 MG PO TABS: 15.0000 mg | ORAL_TABLET | Freq: Every day | ORAL | 0 refills | Status: DC

## 2019-02-11 MED ORDER — FLUOXETINE HCL 40 MG PO CAPS: 40.0000 mg | ORAL_CAPSULE | Freq: Every day | ORAL | 5 refills | Status: DC

## 2019-02-11 NOTE — Telephone Encounter (Signed)
Can you call mike at [employer] to see if he has seen him yet thx

## 2019-02-11 NOTE — Telephone Encounter (Signed)
Pt needs a 6 months appt

## 2019-02-11 NOTE — Progress Notes (Signed)
Virtual Visit Note  I connected with patient on 02/11/19 at 423pm by phone and verified that I am speaking with the correct person using two identifiers. Louis Zimmerman is currently located at home and patient is currently with them during visit. The provider, Rutherford Guys, MD is located in their office at time of visit.  I discussed the limitations, risks, security and privacy concerns of performing an evaluation and management service by telephone and the availability of in person appointments. I also discussed with the patient that there may be a patient responsible charge related to this service. The patient expressed understanding and agreed to proceed.   CC: med refill  HPI ? 58 yo Male with PMH of ADD and depression who presents for medication refill  Last OV May 2020 Has upcoming surgery for LEFT rotator cuff repair Has been out of work since May, was fired once he went for [employer], he is doing ok Daughter living with him  Depression and ADD doing well on current medications Denies any side effects Taking prozac 40mg  once a day Takes adderral 7.5 mg BID pmp renewed  Depression screen [facility] 2/9 02/11/2019 12/18/2018 12/10/2018 05/28/2018 03/28/2018  Decreased Interest 0 0 0 0 0  Down, Depressed, Hopeless 0 0 0 0 0  PHQ - 2 Score 0 0 0 0 0  Altered sleeping - - - - -  Tired, decreased energy - - - - -  Change in appetite - - - - -  Feeling bad or failure about yourself  - - - - -  Trouble concentrating - - - - -  Moving slowly or fidgety/restless - - - - -  Suicidal thoughts - - - - -  PHQ-9 Score - - - - -  Difficult doing work/chores - - - - -    Allergies  Allergen Reactions  . Hydrocodone Anaphylaxis    Prior to Admission medications   Medication Sig Start Date End Date Taking? Authorizing Provider  amphetamine-dextroamphetamine (ADDERALL) 15 MG tablet Take 1 tablet by mouth daily before breakfast. Patient taking differently: Take 7.5 mg by mouth 2  (two) times daily.  01/28/19 02/27/19  Rutherford Guys, MD  amphetamine-dextroamphetamine (ADDERALL) 15 MG tablet Take 1 tablet by mouth daily before breakfast. Of to fill 60 days after signature 01/28/19 02/27/19  Rutherford Guys, MD  amphetamine-dextroamphetamine (ADDERALL) 15 MG tablet Take 1 tablet by mouth daily before breakfast. Ok to fill 30 days after signature 01/28/19 02/27/19  Rutherford Guys, MD  aspirin EC 81 MG tablet Take 1 tablet (81 mg total) by mouth daily. Patient not taking: Reported on 02/05/2019 06/26/18   Oswald Hillock, MD  celecoxib (CELEBREX) 200 MG capsule 1 po bid Patient not taking: Reported on 02/05/2019 12/11/18   Meredith Pel, MD  cyclobenzaprine (FLEXERIL) 10 MG tablet Take 1 tablet (10 mg total) by mouth at bedtime. Patient not taking: Reported on 02/05/2019 12/10/18   Rutherford Guys, MD  cyclobenzaprine (FLEXERIL) 10 MG tablet 1 po q 8 hrs prn Patient not taking: Reported on 02/05/2019 12/11/18   Meredith Pel, MD  diclofenac sodium (VOLTAREN) 1 % GEL Apply 2 g topically 3 (three) times daily as needed (shoulder pain).    [provider]  FLUoxetine (PROZAC) 20 MG tablet Take 2 tablets (40 mg total) by mouth daily. 01/13/19   Rutherford Guys, MD  gabapentin (NEURONTIN) 300 MG capsule Take 1 capsule (300 mg total) by mouth every  morning AND 2 capsules (600 mg total) at bedtime. Patient taking differently: Take 600 mg by mouth at bedtime 01/13/19   Rutherford Guys, MD  Menthol (ICY HOT) 5 % [facility] Place 1 patch onto the skin daily as needed (shoulder pain).    [provider]  naproxen (NAPROSYN) 500 MG tablet Take 1 tablet (500 mg total) by mouth 2 (two) times daily with a meal. Patient not taking: Reported on 02/05/2019 09/07/18   Rutherford Guys, MD    Past Medical History:  Diagnosis Date  . ADD (attention deficit disorder)   . Depression     Past Surgical History:  Procedure Laterality Date  . CHOLECYSTECTOMY    . GALLBLADDER SURGERY   2003  . SKIN BIOPSY  2019    Social History   Tobacco Use  . Smoking status: Current Every Day Smoker    Packs/day: 2.00    Types: Cigarettes  . Smokeless tobacco: Never Used  Substance Use Topics  . Alcohol use: No    Family History  Problem Relation Age of Onset  . Cancer Mother   . Dementia Mother   . Diabetes Mother   . Heart Problems Mother   . Liver cancer Father   . Colon cancer Father   . Alcohol abuse Maternal Grandfather   . Alcohol abuse Paternal Grandfather   . Colon polyps Sister   . Colon polyps Sister     ROS Per hpi  Objective  Vitals as reported by the patient: none   ASSESSMENT and PLAN  1. Attention deficit disorder (ADD) in adult  2. Moderate episode of recurrent major depressive disorder ([place])  3. Medication management - ToxASSURE Select 13 (MW), Urine; Future  Controlled. Continue current regime. meds refilled for aug, sept, oct.  Patient to call in next set of refill.  Other orders - FLUoxetine (PROZAC) 40 MG capsule; Take 1 capsule (40 mg total) by mouth daily. - amphetamine-dextroamphetamine (ADDERALL) 15 MG tablet; Take 1 tablet by mouth daily before breakfast. - amphetamine-dextroamphetamine (ADDERALL) 15 MG tablet; Take 1 tablet by mouth daily before breakfast. - amphetamine-dextroamphetamine (ADDERALL) 15 MG tablet; Take 1 tablet by mouth daily before breakfast.  FOLLOW-UP: 6 months   The above assessment and management plan was discussed with the patient. The patient verbalized understanding of and has agreed to the management plan. Patient is aware to call the clinic if symptoms persist or worsen. Patient is aware when to return to the clinic for a follow-up visit. Patient educated on when it is appropriate to go to the emergency department.    I provided 15 minutes of non-face-to-face time during this encounter.  Rutherford Guys, MD [facility] [place], [place] [address] Ph.  [phone] Fax  [phone]

## 2019-02-11 NOTE — Progress Notes (Signed)
Pt is needing a refill on his adderall 15mg , he is having no other medical concerns today. Completed phq9, score 0. Has a tox 13 scheduled for future lab

## 2019-02-12 ENCOUNTER — Other Ambulatory Visit (HOSPITAL_COMMUNITY)
Admission: RE | Admit: 2019-02-12 | Discharge: 2019-02-12 | Disposition: A | Discharge status: Home / Self Care | Payer: BC Managed Care – PPO | Source: Ambulatory Visit | Attending: Orthopedic Surgery | Admitting: Orthopedic Surgery

## 2019-02-12 DIAGNOSIS — Z1159 Encounter for screening for other viral diseases: Secondary | ICD-10-CM | POA: Diagnosis not present

## 2019-02-12 LAB — SARS CORONAVIRUS 2 (TAT 6-24 HRS): SARS Coronavirus 2: NEGATIVE

## 2019-02-12 NOTE — Telephone Encounter (Signed)
grerat

## 2019-02-12 NOTE — Telephone Encounter (Signed)
IC patient did get shoulder brace

## 2019-02-16 ENCOUNTER — Ambulatory Visit (HOSPITAL_COMMUNITY): Payer: BC Managed Care – PPO | Admitting: Vascular Surgery

## 2019-02-16 ENCOUNTER — Observation Stay (HOSPITAL_COMMUNITY)
Admission: RE | Admit: 2019-02-16 | Discharge: 2019-02-17 | Disposition: A | Discharge status: Home / Self Care | Payer: Self-pay | Attending: Orthopedic Surgery | Admitting: Orthopedic Surgery

## 2019-02-16 ENCOUNTER — Encounter (HOSPITAL_COMMUNITY): Payer: Self-pay | Admitting: Certified Registered Nurse Anesthetist

## 2019-02-16 ENCOUNTER — Other Ambulatory Visit: Payer: Self-pay

## 2019-02-16 ENCOUNTER — Ambulatory Visit (HOSPITAL_COMMUNITY): Payer: BC Managed Care – PPO | Admitting: Certified Registered Nurse Anesthetist

## 2019-02-16 ENCOUNTER — Encounter (HOSPITAL_COMMUNITY)
Admission: RE | Disposition: A | Discharge status: Home / Self Care | Payer: Self-pay | Source: Home / Self Care | Attending: Orthopedic Surgery

## 2019-02-16 ENCOUNTER — Encounter: Payer: Self-pay | Admitting: Family Medicine

## 2019-02-16 DIAGNOSIS — M12812 Other specific arthropathies, not elsewhere classified, left shoulder: Secondary | ICD-10-CM

## 2019-02-16 DIAGNOSIS — Z20828 Contact with and (suspected) exposure to other viral communicable diseases: Secondary | ICD-10-CM | POA: Insufficient documentation

## 2019-02-16 DIAGNOSIS — Z79899 Other long term (current) drug therapy: Secondary | ICD-10-CM | POA: Insufficient documentation

## 2019-02-16 DIAGNOSIS — M7522 Bicipital tendinitis, left shoulder: Secondary | ICD-10-CM

## 2019-02-16 DIAGNOSIS — F17210 Nicotine dependence, cigarettes, uncomplicated: Secondary | ICD-10-CM | POA: Insufficient documentation

## 2019-02-16 DIAGNOSIS — W19XXXA Unspecified fall, initial encounter: Secondary | ICD-10-CM | POA: Insufficient documentation

## 2019-02-16 DIAGNOSIS — M7521 Bicipital tendinitis, right shoulder: Secondary | ICD-10-CM

## 2019-02-16 DIAGNOSIS — S46012A Strain of muscle(s) and tendon(s) of the rotator cuff of left shoulder, initial encounter: Principal | ICD-10-CM | POA: Insufficient documentation

## 2019-02-16 DIAGNOSIS — F329 Major depressive disorder, single episode, unspecified: Secondary | ICD-10-CM | POA: Insufficient documentation

## 2019-02-16 DIAGNOSIS — M12819 Other specific arthropathies, not elsewhere classified, unspecified shoulder: Secondary | ICD-10-CM | POA: Diagnosis present

## 2019-02-16 DIAGNOSIS — F988 Other specified behavioral and emotional disorders with onset usually occurring in childhood and adolescence: Secondary | ICD-10-CM | POA: Insufficient documentation

## 2019-02-16 HISTORY — PX: SHOULDER ARTHROSCOPY WITH DEBRIDEMENT AND BICEP TENDON REPAIR: SHX5690

## 2019-02-16 HISTORY — PX: SHOULDER ARTHROSCOPY: SHX128

## 2019-02-16 SURGERY — SHOULDER ARTHROSCOPY WITH DEBRIDEMENT AND BICEP TENDON REPAIR
Anesthesia: General | Site: Shoulder | Laterality: Left

## 2019-02-16 MED ORDER — ONDANSETRON HCL 4 MG/2ML IJ SOLN: 4.0000 mg | Freq: Once | INTRAMUSCULAR | Status: DC | PRN

## 2019-02-16 MED ORDER — FENTANYL CITRATE (PF) 250 MCG/5ML IJ SOLN
INTRAMUSCULAR | Status: AC
  Filled 2019-02-16: qty 5

## 2019-02-16 MED ORDER — GLYCOPYRROLATE PF 0.2 MG/ML IJ SOSY
PREFILLED_SYRINGE | INTRAMUSCULAR | Status: AC
  Filled 2019-02-16: qty 1

## 2019-02-16 MED ORDER — FENTANYL CITRATE (PF) 100 MCG/2ML IJ SOLN
INTRAMUSCULAR | Status: AC
  Administered 2019-02-16: 50 ug via INTRAVENOUS
  Filled 2019-02-16: qty 2

## 2019-02-16 MED ORDER — 0.9 % SODIUM CHLORIDE (POUR BTL) OPTIME
TOPICAL | Status: DC | PRN
  Administered 2019-02-16 (×3): 1000 mL

## 2019-02-16 MED ORDER — GLYCOPYRROLATE 0.2 MG/ML IJ SOLN
INTRAMUSCULAR | Status: DC | PRN
  Administered 2019-02-16: 0.1 mg via INTRAVENOUS
  Administered 2019-02-16: 0.2 mg via INTRAVENOUS
  Administered 2019-02-16: 0.1 mg via INTRAVENOUS

## 2019-02-16 MED ORDER — DOCUSATE SODIUM 100 MG PO CAPS
100.0000 mg | ORAL_CAPSULE | Freq: Two times a day (BID) | ORAL | Status: DC
  Administered 2019-02-16 – 2019-02-17 (×2): 100 mg via ORAL
  Filled 2019-02-16 (×2): qty 1

## 2019-02-16 MED ORDER — LIDOCAINE 2% (20 MG/ML) 5 ML SYRINGE
INTRAMUSCULAR | Status: AC
  Filled 2019-02-16: qty 5

## 2019-02-16 MED ORDER — ACETAMINOPHEN 325 MG PO TABS: 325.0000 mg | ORAL_TABLET | Freq: Four times a day (QID) | ORAL | Status: DC | PRN

## 2019-02-16 MED ORDER — CEFAZOLIN SODIUM-DEXTROSE 2-4 GM/100ML-% IV SOLN
2.0000 g | INTRAVENOUS | Status: AC
  Administered 2019-02-16 (×2): 2 g via INTRAVENOUS

## 2019-02-16 MED ORDER — PHENYLEPHRINE 40 MCG/ML (10ML) SYRINGE FOR IV PUSH (FOR BLOOD PRESSURE SUPPORT)
PREFILLED_SYRINGE | INTRAVENOUS | Status: DC | PRN
  Administered 2019-02-16: 80 ug via INTRAVENOUS

## 2019-02-16 MED ORDER — ONDANSETRON HCL 4 MG/2ML IJ SOLN: 4.0000 mg | Freq: Four times a day (QID) | INTRAMUSCULAR | Status: DC | PRN

## 2019-02-16 MED ORDER — PHENOL 1.4 % MT LIQD: 1.0000 | OROMUCOSAL | Status: DC | PRN

## 2019-02-16 MED ORDER — FENTANYL CITRATE (PF) 100 MCG/2ML IJ SOLN
25.0000 ug | INTRAMUSCULAR | Status: DC | PRN
  Administered 2019-02-16 (×2): 50 ug via INTRAVENOUS

## 2019-02-16 MED ORDER — PROPOFOL 10 MG/ML IV BOLUS
INTRAVENOUS | Status: AC
  Filled 2019-02-16: qty 20

## 2019-02-16 MED ORDER — EPHEDRINE 5 MG/ML INJ
INTRAVENOUS | Status: AC
  Filled 2019-02-16: qty 10

## 2019-02-16 MED ORDER — SODIUM CHLORIDE 0.9 % IV SOLN
INTRAVENOUS | Status: DC | PRN
  Administered 2019-02-16: 13:00:00 45 ug/min via INTRAVENOUS

## 2019-02-16 MED ORDER — FENTANYL CITRATE (PF) 100 MCG/2ML IJ SOLN
50.0000 ug | Freq: Once | INTRAMUSCULAR | Status: AC
  Administered 2019-02-16: 11:00:00 50 ug via INTRAVENOUS

## 2019-02-16 MED ORDER — PHENYLEPHRINE 40 MCG/ML (10ML) SYRINGE FOR IV PUSH (FOR BLOOD PRESSURE SUPPORT)
PREFILLED_SYRINGE | INTRAVENOUS | Status: AC
  Filled 2019-02-16: qty 10

## 2019-02-16 MED ORDER — FENTANYL CITRATE (PF) 100 MCG/2ML IJ SOLN
INTRAMUSCULAR | Status: AC
  Filled 2019-02-16: qty 2

## 2019-02-16 MED ORDER — EPINEPHRINE PF 1 MG/ML IJ SOLN
INTRAMUSCULAR | Status: AC
  Filled 2019-02-16: qty 4

## 2019-02-16 MED ORDER — ONDANSETRON HCL 4 MG PO TABS: 4.0000 mg | ORAL_TABLET | Freq: Four times a day (QID) | ORAL | Status: DC | PRN

## 2019-02-16 MED ORDER — BUPIVACAINE HCL (PF) 0.25 % IJ SOLN
INTRAMUSCULAR | Status: DC | PRN
  Administered 2019-02-16: 15 mL

## 2019-02-16 MED ORDER — ONDANSETRON HCL 4 MG/2ML IJ SOLN
INTRAMUSCULAR | Status: AC
  Filled 2019-02-16: qty 4

## 2019-02-16 MED ORDER — METOCLOPRAMIDE HCL 5 MG/ML IJ SOLN: 5.0000 mg | Freq: Three times a day (TID) | INTRAMUSCULAR | Status: DC | PRN

## 2019-02-16 MED ORDER — MIDAZOLAM HCL 2 MG/2ML IJ SOLN
1.0000 mg | Freq: Once | INTRAMUSCULAR | Status: AC
  Administered 2019-02-16: 1 mg via INTRAVENOUS

## 2019-02-16 MED ORDER — CEFAZOLIN SODIUM-DEXTROSE 2-4 GM/100ML-% IV SOLN
INTRAVENOUS | Status: AC
  Filled 2019-02-16: qty 100

## 2019-02-16 MED ORDER — EPINEPHRINE PF 1 MG/ML IJ SOLN
INTRAMUSCULAR | Status: DC | PRN
  Administered 2019-02-16 (×2): 1 mg

## 2019-02-16 MED ORDER — CEFAZOLIN SODIUM-DEXTROSE 2-4 GM/100ML-% IV SOLN
2.0000 g | Freq: Four times a day (QID) | INTRAVENOUS | Status: AC
  Administered 2019-02-16 – 2019-02-17 (×2): 2 g via INTRAVENOUS
  Filled 2019-02-16 (×2): qty 100

## 2019-02-16 MED ORDER — SUGAMMADEX SODIUM 200 MG/2ML IV SOLN
INTRAVENOUS | Status: DC | PRN
  Administered 2019-02-16: 200 mg via INTRAVENOUS

## 2019-02-16 MED ORDER — CLONIDINE HCL (ANALGESIA) 100 MCG/ML EP SOLN
EPIDURAL | Status: DC | PRN
  Administered 2019-02-16: 100 ug

## 2019-02-16 MED ORDER — MORPHINE SULFATE (PF) 4 MG/ML IV SOLN
INTRAVENOUS | Status: AC
  Filled 2019-02-16: qty 2

## 2019-02-16 MED ORDER — LIDOCAINE 2% (20 MG/ML) 5 ML SYRINGE
INTRAMUSCULAR | Status: DC | PRN
  Administered 2019-02-16: 60 mg via INTRAVENOUS

## 2019-02-16 MED ORDER — FENTANYL CITRATE (PF) 250 MCG/5ML IJ SOLN
INTRAMUSCULAR | Status: DC | PRN
  Administered 2019-02-16: 100 ug via INTRAVENOUS
  Administered 2019-02-16 (×2): 50 ug via INTRAVENOUS

## 2019-02-16 MED ORDER — MIDAZOLAM HCL 2 MG/2ML IJ SOLN
INTRAMUSCULAR | Status: AC
  Administered 2019-02-16: 11:00:00 1 mg via INTRAVENOUS
  Filled 2019-02-16: qty 2

## 2019-02-16 MED ORDER — HYDROMORPHONE HCL 2 MG PO TABS
2.0000 mg | ORAL_TABLET | ORAL | Status: DC | PRN
  Administered 2019-02-16 – 2019-02-17 (×4): 2 mg via ORAL
  Filled 2019-02-16 (×4): qty 1

## 2019-02-16 MED ORDER — DEXAMETHASONE SODIUM PHOSPHATE 10 MG/ML IJ SOLN
INTRAMUSCULAR | Status: AC
  Filled 2019-02-16: qty 1

## 2019-02-16 MED ORDER — MIDAZOLAM HCL 2 MG/2ML IJ SOLN
INTRAMUSCULAR | Status: AC
  Filled 2019-02-16: qty 2

## 2019-02-16 MED ORDER — ROCURONIUM BROMIDE 10 MG/ML (PF) SYRINGE
PREFILLED_SYRINGE | INTRAVENOUS | Status: DC | PRN
  Administered 2019-02-16: 50 mg via INTRAVENOUS

## 2019-02-16 MED ORDER — EPHEDRINE SULFATE-NACL 50-0.9 MG/10ML-% IV SOSY
PREFILLED_SYRINGE | INTRAVENOUS | Status: DC | PRN
  Administered 2019-02-16 (×2): 10 mg via INTRAVENOUS

## 2019-02-16 MED ORDER — CHLORHEXIDINE GLUCONATE 4 % EX LIQD: 60.0000 mL | Freq: Once | CUTANEOUS | Status: DC

## 2019-02-16 MED ORDER — ONDANSETRON HCL 4 MG/2ML IJ SOLN
INTRAMUSCULAR | Status: DC | PRN
  Administered 2019-02-16: 4 mg via INTRAVENOUS

## 2019-02-16 MED ORDER — ROCURONIUM BROMIDE 10 MG/ML (PF) SYRINGE
PREFILLED_SYRINGE | INTRAVENOUS | Status: AC
  Filled 2019-02-16: qty 10

## 2019-02-16 MED ORDER — MENTHOL 3 MG MT LOZG: 1.0000 | LOZENGE | OROMUCOSAL | Status: DC | PRN

## 2019-02-16 MED ORDER — CELECOXIB 200 MG PO CAPS
200.0000 mg | ORAL_CAPSULE | Freq: Two times a day (BID) | ORAL | Status: DC
  Administered 2019-02-16 – 2019-02-17 (×2): 200 mg via ORAL
  Filled 2019-02-16 (×2): qty 1

## 2019-02-16 MED ORDER — BUPIVACAINE HCL (PF) 0.25 % IJ SOLN
INTRAMUSCULAR | Status: AC
  Filled 2019-02-16: qty 30

## 2019-02-16 MED ORDER — SODIUM CHLORIDE 0.9 % IR SOLN
Status: DC | PRN
  Administered 2019-02-16 (×2): 3000 mL

## 2019-02-16 MED ORDER — ONDANSETRON HCL 4 MG/2ML IJ SOLN
INTRAMUSCULAR | Status: AC
  Filled 2019-02-16: qty 2

## 2019-02-16 MED ORDER — ASPIRIN EC 81 MG PO TBEC
81.0000 mg | DELAYED_RELEASE_TABLET | Freq: Every day | ORAL | Status: DC
  Administered 2019-02-17: 81 mg via ORAL
  Filled 2019-02-16: qty 1

## 2019-02-16 MED ORDER — DEXAMETHASONE SODIUM PHOSPHATE 10 MG/ML IJ SOLN
INTRAMUSCULAR | Status: DC | PRN
  Administered 2019-02-16: 10 mg via INTRAVENOUS

## 2019-02-16 MED ORDER — GLYCOPYRROLATE PF 0.2 MG/ML IJ SOSY
PREFILLED_SYRINGE | INTRAMUSCULAR | Status: AC
  Filled 2019-02-16: qty 3

## 2019-02-16 MED ORDER — METOCLOPRAMIDE HCL 5 MG PO TABS: 5.0000 mg | ORAL_TABLET | Freq: Three times a day (TID) | ORAL | Status: DC | PRN

## 2019-02-16 MED ORDER — MORPHINE SULFATE (PF) 4 MG/ML IV SOLN: INTRAVENOUS | Status: DC | PRN

## 2019-02-16 MED ORDER — CLONIDINE HCL (ANALGESIA) 100 MCG/ML EP SOLN
EPIDURAL | Status: AC
  Filled 2019-02-16: qty 10

## 2019-02-16 MED ORDER — PROPOFOL 10 MG/ML IV BOLUS
INTRAVENOUS | Status: DC | PRN
  Administered 2019-02-16: 150 mg via INTRAVENOUS

## 2019-02-16 MED ORDER — LACTATED RINGERS IV SOLN
INTRAVENOUS | Status: DC
  Administered 2019-02-16 (×3): via INTRAVENOUS

## 2019-02-16 SURGICAL SUPPLY — 102 items
ANCHOR SUT BIO SW 4.75X19.1 (Anchor) ×4 IMPLANT
BLADE CUTTER GATOR 3.5 (BLADE) IMPLANT
BLADE GREAT WHITE 4.2 (BLADE) IMPLANT
BLADE SURG 10 STRL SS (BLADE) ×1 IMPLANT
BLADE SURG 11 STRL SS (BLADE) IMPLANT
BLADE SURG 15 STRL LF DISP TIS (BLADE) IMPLANT
BLADE SURG 15 STRL SS (BLADE) ×2
BUR OVAL 6.0 (BURR) IMPLANT
CLSR STERI-STRIP ANTIMIC 1/2X4 (GAUZE/BANDAGES/DRESSINGS) ×1 IMPLANT
COVER SURGICAL LIGHT HANDLE (MISCELLANEOUS) ×1 IMPLANT
COVER WAND RF STERILE (DRAPES) ×2 IMPLANT
DISSECTOR 4.0MM X 13CM (MISCELLANEOUS) ×1 IMPLANT
DRAPE INCISE IOBAN 66X45 STRL (DRAPES) ×2 IMPLANT
DRAPE STERI 35X30 U-POUCH (DRAPES) ×2 IMPLANT
DRAPE U-SHAPE 47X51 STRL (DRAPES) ×4 IMPLANT
DRSG AQUACEL AG ADV 3.5X 6 (GAUZE/BANDAGES/DRESSINGS) ×1 IMPLANT
DRSG AQUACEL AG ADV 3.5X10 (GAUZE/BANDAGES/DRESSINGS) ×1 IMPLANT
DRSG TEGADERM 2-3/8X2-3/4 SM (GAUZE/BANDAGES/DRESSINGS) ×2 IMPLANT
DRSG TEGADERM 4X4.75 (GAUZE/BANDAGES/DRESSINGS) ×5 IMPLANT
DRSG XEROFORM 1X8 (GAUZE/BANDAGES/DRESSINGS) ×1 IMPLANT
DURAPREP 26ML APPLICATOR (WOUND CARE) ×3 IMPLANT
ELECT REM PT RETURN 9FT ADLT (ELECTROSURGICAL) ×2
ELECTRODE REM PT RTRN 9FT ADLT (ELECTROSURGICAL) ×1 IMPLANT
FILTER STRAW FLUID ASPIR (MISCELLANEOUS) ×2 IMPLANT
GAUZE SPONGE 4X4 12PLY STRL (GAUZE/BANDAGES/DRESSINGS) ×2 IMPLANT
GAUZE SPONGE 4X4 12PLY STRL LF (GAUZE/BANDAGES/DRESSINGS) ×1 IMPLANT
GAUZE XEROFORM 1X8 LF (GAUZE/BANDAGES/DRESSINGS) ×2 IMPLANT
GLOVE BIO SURGEON STRL SZ 6.5 (GLOVE) ×1 IMPLANT
GLOVE BIO SURGEON STRL SZ7.5 (GLOVE) ×1 IMPLANT
GLOVE BIOGEL PI IND STRL 7.5 (GLOVE) IMPLANT
GLOVE BIOGEL PI IND STRL 8 (GLOVE) ×1 IMPLANT
GLOVE BIOGEL PI INDICATOR 7.5 (GLOVE) ×2
GLOVE BIOGEL PI INDICATOR 8 (GLOVE) ×1
GLOVE ECLIPSE 7.0 STRL STRAW (GLOVE) ×2 IMPLANT
GLOVE INDICATOR 7.5 STRL GRN (GLOVE) ×2 IMPLANT
GLOVE SURG ORTHO 8.0 STRL STRW (GLOVE) ×3 IMPLANT
GLOVE SURG SS PI 7.0 STRL IVOR (GLOVE) ×1 IMPLANT
GOWN STRL REUS W/ TWL LRG LVL3 (GOWN DISPOSABLE) ×3 IMPLANT
GOWN STRL REUS W/TWL LRG LVL3 (GOWN DISPOSABLE) ×3
GRAFT ACHILLES CALC BNE BLCK (Bone Implant) IMPLANT
GRAFT ACHILLES TENDON (Bone Implant) ×2 IMPLANT
JET LAVAGE IRRISEPT WOUND (IRRIGATION / IRRIGATOR) ×2
KIT BASIN OR (CUSTOM PROCEDURE TRAY) ×2 IMPLANT
KIT BEACH CHAIR TRIMANO (MISCELLANEOUS) ×1 IMPLANT
KIT TURNOVER KIT B (KITS) ×2 IMPLANT
LAVAGE JET IRRISEPT WOUND (IRRIGATION / IRRIGATOR) IMPLANT
MANIFOLD NEPTUNE II (INSTRUMENTS) ×2 IMPLANT
NDL 18GX1X1/2 (RX/OR ONLY) (NEEDLE) IMPLANT
NDL FILTER BLUNT 18X1 1/2 (NEEDLE) IMPLANT
NDL HYPO 25X1 1.5 SAFETY (NEEDLE) IMPLANT
NDL SCORPION MULTI FIRE (NEEDLE) IMPLANT
NDL SPNL 18GX3.5 QUINCKE PK (NEEDLE) ×1 IMPLANT
NDL SUT 6 .5 CRC .975X.05 MAYO (NEEDLE) ×1 IMPLANT
NEEDLE 18GX1X1/2 (RX/OR ONLY) (NEEDLE) ×4 IMPLANT
NEEDLE 22X1 1/2 (OR ONLY) (NEEDLE) ×2 IMPLANT
NEEDLE FILTER BLUNT 18X 1/2SAF (NEEDLE) ×1
NEEDLE FILTER BLUNT 18X1 1/2 (NEEDLE) ×1 IMPLANT
NEEDLE HYPO 25X1 1.5 SAFETY (NEEDLE) IMPLANT
NEEDLE MAYO TAPER (NEEDLE) ×1
NEEDLE SCORPION MULTI FIRE (NEEDLE) ×4 IMPLANT
NEEDLE SPNL 18GX3.5 QUINCKE PK (NEEDLE) ×2 IMPLANT
NS IRRIG 1000ML POUR BTL (IV SOLUTION) ×2 IMPLANT
PACK SHOULDER (CUSTOM PROCEDURE TRAY) ×2 IMPLANT
PAD ARMBOARD 7.5X6 YLW CONV (MISCELLANEOUS) ×4 IMPLANT
PORT APPOLLO RF 90DEGREE MULTI (SURGICAL WAND) ×1 IMPLANT
SLING ARM IMMOBILIZER MED (SOFTGOODS) IMPLANT
SPONGE LAP 18X18 X RAY DECT (DISPOSABLE) ×2 IMPLANT
SPONGE LAP 4X18 RFD (DISPOSABLE) ×4 IMPLANT
STRIP CLOSURE SKIN 1/2X4 (GAUZE/BANDAGES/DRESSINGS) ×2 IMPLANT
SUCTION FRAZIER HANDLE 10FR (MISCELLANEOUS)
SUCTION TUBE FRAZIER 10FR DISP (MISCELLANEOUS) IMPLANT
SUT 0 FIBERLOOP 38 BLUE TPR ND (SUTURE) ×6
SUT ETHILON 3 0 PS 1 (SUTURE) ×3 IMPLANT
SUT FIBERWIRE 2-0 18 17.9 3/8 (SUTURE)
SUT MNCRL 3 0 RB1 (SUTURE) ×1 IMPLANT
SUT MNCRL AB 3-0 PS2 18 (SUTURE) ×2 IMPLANT
SUT MON AB 2-0 CT1 36 (SUTURE) ×1 IMPLANT
SUT MONOCRYL 3 0 RB1 (SUTURE) ×1
SUT VIC AB 0 CT1 27 (SUTURE) ×3
SUT VIC AB 0 CT1 27XBRD ANBCTR (SUTURE) ×1 IMPLANT
SUT VIC AB 1 CT1 27 (SUTURE) ×3
SUT VIC AB 1 CT1 27XBRD ANBCTR (SUTURE) IMPLANT
SUT VIC AB 2-0 CT1 27 (SUTURE) ×2
SUT VIC AB 2-0 CT1 TAPERPNT 27 (SUTURE) ×1 IMPLANT
SUT VIC AB 2-0 UR6 27 (SUTURE) ×1 IMPLANT
SUT VICRYL 0 UR6 27IN ABS (SUTURE) ×10 IMPLANT
SUTURE 0 FIBERLP 38 BLU TPR ND (SUTURE) IMPLANT
SUTURE FIBERWR 2-0 18 17.9 3/8 (SUTURE) IMPLANT
SUTURE TAPE 1.3 40 TPR END (SUTURE) IMPLANT
SUTURE TAPE 1.3 FIBERLOP 20 ST (SUTURE) IMPLANT
SUTURE TAPE TIGERLINK 1.3MM BL (SUTURE) IMPLANT
SUTURETAPE 1.3 40 TPR END (SUTURE) ×4
SUTURETAPE 1.3 FIBERLOOP 20 ST (SUTURE) ×4
SUTURETAPE TIGERLINK 1.3MM BL (SUTURE) ×6
SYR 20ML LL LF (SYRINGE) ×2 IMPLANT
SYR 3ML LL SCALE MARK (SYRINGE) ×5 IMPLANT
SYR TB 1ML LUER SLIP (SYRINGE) ×2 IMPLANT
TOWEL GREEN STERILE (TOWEL DISPOSABLE) ×2 IMPLANT
TOWEL GREEN STERILE FF (TOWEL DISPOSABLE) ×2 IMPLANT
TUBING ARTHROSCOPY IRRIG 16FT (MISCELLANEOUS) ×2 IMPLANT
WAND HAND CNTRL MULTIVAC 90 (MISCELLANEOUS) IMPLANT
WATER STERILE IRR 1000ML POUR (IV SOLUTION) ×2 IMPLANT

## 2019-02-16 NOTE — OR Nursing (Signed)
Care of patient assumed at 1732.

## 2019-02-16 NOTE — Anesthesia Procedure Notes (Signed)
Procedure Name: Intubation Date/Time: 02/16/2019 1:14 PM Performed by: Julieta Bellini, CRNA Pre-anesthesia Checklist: Patient identified, Emergency Drugs available, Suction available and Patient being monitored Patient Re-evaluated:Patient Re-evaluated prior to induction Oxygen Delivery Method: Circle system utilized Preoxygenation: Pre-oxygenation with 100% oxygen Induction Type: IV induction Ventilation: Mask ventilation without difficulty and Oral airway inserted - appropriate to patient size Laryngoscope Size: Sabra Heck and 2 Grade View: Grade I Tube type: Oral Tube size: 7.5 mm Number of attempts: 1 Airway Equipment and Method: Stylet and Oral airway Placement Confirmation: ETT inserted through vocal cords under direct vision,  positive ETCO2 and breath sounds checked- equal and bilateral Secured at: 22 cm Tube secured with: Tape Dental Injury: Teeth and Oropharynx as per pre-operative assessment

## 2019-02-16 NOTE — Anesthesia Procedure Notes (Signed)
Anesthesia Regional Block: Interscalene brachial plexus block   Pre-Anesthetic Checklist: ,, timeout performed, Correct Patient, Correct Site, Correct Laterality, Correct Procedure, Correct Position, site marked, Risks and benefits discussed,  Surgical consent,  Pre-op evaluation,  At surgeon's request and post-op pain management  Laterality: Left  Prep: chloraprep       Needles:  Injection technique: Single-shot  Needle Type: Stimulator Needle - 40      Needle Gauge: 22     Additional Needles:   Procedures:, nerve stimulator,,,,,,,  Narrative:  Start time: 02/16/2019 10:30 AM End time: 02/16/2019 10:40 AM Injection made incrementally with aspirations every 5 mL.  Performed by: Personally  Anesthesiologist: Roberts Gaudy, MD  Additional Notes: 15 cc 0.5% Bupivacaine and 10 cc 1.3% Exparel injected easily

## 2019-02-16 NOTE — Brief Op Note (Signed)
   02/16/2019  5:40 PM  PATIENT:  Marcha Dutton  58 y.o. male  PRE-OPERATIVE DIAGNOSIS:  left shouldercomplete rotator cuff tear, initial encounter  POST-OPERATIVE DIAGNOSIS:  left shouldercomplete rotator cuff tear, initial encounter  PROCEDURE:  Procedure(s): LEFT SHOULDER ARTHROSCOPY LOWER TRAPEZIUS TENDON TRANSFER, BICEPS TENODESIS  SURGEON:  Surgeon(s): [name], Tonna Corner, MD  ASSISTANT: Annie Main, PA  ANESTHESIA:   general  EBL: 45 ml    Total I/O In: 1000 [I.V.:1000] Out: -   BLOOD ADMINISTERED: none  DRAINS: none   LOCAL MEDICATIONS USED: Marcaine clonidine  SPECIMEN:  No Specimen  COUNTS:  YES  TOURNIQUET:  * No tourniquets in log *  DICTATION: .Other Dictation: Dictation Number [id number]  PLAN OF CARE: Discharge to home after PACU  PATIENT DISPOSITION:  PACU - hemodynamically stable

## 2019-02-16 NOTE — Anesthesia Postprocedure Evaluation (Signed)
Anesthesia Post Note  Patient: Louis Zimmerman  Procedure(s) Performed: LEFT SHOULDER ARTHROSCOPY LOWER TRAPEZIUS TENDON TRANSFER, BICEPS TENODESIS (Left Shoulder)     Patient location during evaluation: PACU Anesthesia Type: General Level of consciousness: awake and alert Pain management: pain level controlled Vital Signs Assessment: post-procedure vital signs reviewed and stable Respiratory status: spontaneous breathing, nonlabored ventilation, respiratory function stable and patient connected to nasal cannula oxygen Cardiovascular status: blood pressure returned to baseline and stable Postop Assessment: no apparent nausea or vomiting Anesthetic complications: no    Last Vitals:  Vitals:   02/16/19 1755 02/16/19 1810  BP: 95/71 104/78  Pulse: 83 66  Resp: 16 11  Temp:    SpO2: 99% 97%    Last Pain:  Vitals:   02/16/19 1810  TempSrc:   PainSc: 4                  Delano Scardino COKER

## 2019-02-16 NOTE — Op Note (Signed)
NAME: Louis Zimmerman, Louis Zimmerman MEDICAL RECORD [id number] ACCOUNT [id number] DATE OF BIRTH:May 15, 1961 FACILITY: [facility] LOCATION: [facility] PHYSICIAN:GREGORY Randel Pigg, MD  OPERATIVE REPORT  DATE OF PROCEDURE:  02/16/2019  PREOPERATIVE DIAGNOSIS:  Left shoulder supraspinatus and infraspinatus rotator cuff tear with biceps tendinitis.  POSTOPERATIVE DIAGNOSIS:  Left shoulder supraspinatus and infraspinatus rotator cuff tear with biceps tendinitis.  PROCEDURE:  Left shoulder arthroscopy with release of the biceps tendon and limited debridement of the superior labrum followed by attempted rotator cuff repair of the infraspinatus and supraspinatus without success, followed by open biceps tenodesis and  lower trapezius tendon transfer utilizing Achilles allograft.  SURGEON:  Meredith Pel, MD  ASSISTANT:  Lurena Joiner Magnet, [place]  INDICATIONS:  This is a patient with a several month history of left shoulder pain.  This occurred after a fall.  MRI scan showed retracted rotator cuff tear to the glenoid rim.  He presents now for operative management after explanation of risks and  benefits.  PROCEDURE IN DETAIL:  The patient was brought to the operating room where general anesthetic was induced.  Preoperative antibiotics administered.  Time-out was called.  The patient was placed in the beach-chair position with the head in neutral position.   Left arm was prescrubbed with hydrogen peroxide, alcohol and Betadine, which was allowed to air dry, then prepped with DuraPrep solution and draped in a sterile manner.  [name] was used to cover the operative field.  The patient had pretty good preop  range of motion.  No instability.  Posterior portal was created 2 cm medial and inferior to the posterolateral margin of acromion.  Anterior portal created under direct visualization.  Diagnostic arthroscopy was performed.  The patient had a significant  rotator cuff tendon tear which appeared more chronic than acute.  There  was retraction.  The tear itself was not particularly mobile.  Biceps tendon was released.  Significant tendinosis was present.  Subscap tendon was intact.  At this time, after  debriding the superior labrum and releasing the biceps tendon, instruments were removed.  An incision off the anterolateral margin of the acromion was made.  Skin and subcutaneous tissue were sharply divided.  Deltoid split a measured distance of 4 cm  and marked with a #1 Vicryl.  Bursectomy was performed.  Subacromial decompression was performed with a rasp.  Preservation of the CA ligament was done.  An attempt was made to mobilize the rotator cuff.  Stay sutures were placed.  We were not able to  mobilize the tendon to the medial aspect of the footprint despite release of the coracohumeral ligament as well as mobilization of the rotator interval slide.  After this, the decision was made to proceed with allograft lower trapezius tendon transfer.   At this time, thorough irrigation of the shoulder joint was performed, and the biceps were tenodesed to the transverse humeral ligament and then later into the initial anchor medially for the lower trapezius tendon transfer.  An incision was made  horizontally over the corner of the scapula.  Skin and subcutaneous tissue were sharply divided.  Fat was mobilized superiorly and inferiorly.  The large trapezius tendon was identified.  Its lateral border was identified.  Then it was detached from the  scapular spine, extending medially but not beyond the medial border of the scapular spine.  Care was taken to avoid injury to the neurovascular spinal accessory nerve bundle.  At this time, good tendon excursion was achieved.  Attention was then directed  towards the allograft.  The allograft was prepared on the back table, placing 2 FiberTape sutures in Truxton fashion through the end.  The tendon was then passed into the shoulder joint.  The infraspinatus fascia was split, and this facilitated  transfer  of the allograft tendon.  It was fixed in the native footprint of the supraspinatus and infraspinatus using 4 Arthrex SwiveLocks.  This gave very good fixation to a prepared footprint.  The rotator cuff tendon was then attached with the traction sutures  to the tendon.  We were able to achieve near watertight closure with the allograft as well as with the native rotator cuff tendon brought over the humeral head.  At this time, the arm was placed in 60 degrees of adduction and 60 degrees of external  rotation.  The Achilles tendon allograft was then passed through the lower latissimus tendon.  Then this was appropriately tensioned using a combination of #1 Vicryl suture as well as FiberTape sutures.  Overall, very secure repair was achieved.  Arm was  taken through a range of motion and was found to be not too tight.  At this time, thorough irrigation of the anterior and posterior incisions was made.  The deltoid split was closed using #1 Vicryl suture followed by interrupted inverted 0 Vicryl  suture, 2-0 Vicryl suture and a 3-0 Monocryl.  The posterior incision was closed using #1 Vicryl suture, 0 Vicryl suture, 2-0 Vicryl suture, and a 3-0 Monocryl.  Impervious dressings were placed and the patient's postoperative splint was applied.  A  postoperative brace was applied, placing the arm in about 60 degrees of abduction and 60 degrees of external rotation.  The patient tolerated the procedure well without immediate complications, transferred to the recovery room in stable condition.   Luke's help was required at all times during the case for graft preparation, opening and closing, retraction.  His assistance was a medical necessity.  LN/NUANCE  D:02/16/2019 T:02/16/2019 JOB:[id number]

## 2019-02-16 NOTE — Transfer of Care (Signed)
Immediate Anesthesia Transfer of Care Note  Patient: Louis Zimmerman  Procedure(s) Performed: LEFT SHOULDER ARTHROSCOPY LOWER TRAPEZIUS TENDON TRANSFER, BICEPS TENODESIS (Left Shoulder)  Patient Location: PACU  Anesthesia Type:General and GA combined with regional for post-op pain  Level of Consciousness: awake, alert  and oriented  Airway & Oxygen Therapy: Patient Spontanous Breathing  Post-op Assessment: Report given to RN and Post -op Vital signs reviewed and stable  Post vital signs: Reviewed and stable  Last Vitals:  Vitals Value Taken Time  BP    Temp    Pulse 66 02/16/19 1737  Resp 18 02/16/19 1740  SpO2 85 % 02/16/19 1737  Vitals shown include unvalidated device data.  Last Pain:  Vitals:   02/16/19 1030  TempSrc:   PainSc: 0-No pain      Patients Stated Pain Goal: 2 (68/25/74 9355)  Complications: No apparent anesthesia complications

## 2019-02-16 NOTE — Progress Notes (Signed)
1835 Received pt from PACU, A&O x4. Left arm on a shoulder brace, fingers are warm, numbness noted but pt c/o pain to incision sites. Dressings to left shoulder dry and intact.

## 2019-02-16 NOTE — H&P (Signed)
Louis Zimmerman is an 58 y.o. male.   Chief Complaint: Left shoulder pain HPI: Louis Zimmerman is a 58 year old patient with left shoulder pain.  He describes weakness as well as pain.  MRI scan shows retracted rotator cuff pathology involving supraspinatus and infraspinatus with tearing and retraction back to the glenoid rim.  Patient reports weakness and pain in that left shoulder.  He is unable to do full physical type of work that he is used to doing.  He presents now for operative management after explanation risk and benefits.  No personal or family history of DVT or pulmonary embolism.  Past Medical History:  Diagnosis Date  . ADD (attention deficit disorder)   . Depression     Past Surgical History:  Procedure Laterality Date  . CHOLECYSTECTOMY    . GALLBLADDER SURGERY  2003  . SKIN BIOPSY  2019    Family History  Problem Relation Age of Onset  . Cancer Mother   . Dementia Mother   . Diabetes Mother   . Heart Problems Mother   . Liver cancer Father   . Colon cancer Father   . Alcohol abuse Maternal Grandfather   . Alcohol abuse Paternal Grandfather   . Colon polyps Sister   . Colon polyps Sister    Social History:  reports that he has been smoking cigarettes. He has been smoking about 2.00 packs per day. He has never used smokeless tobacco. He reports that he does not drink alcohol or use drugs.  Allergies:  Allergies  Allergen Reactions  . Hydrocodone Anaphylaxis    Medications Prior to Admission  Medication Sig Dispense Refill  . [START ON 04/30/2019] amphetamine-dextroamphetamine (ADDERALL) 15 MG tablet Take 1 tablet by mouth daily before breakfast. 30 tablet 0  . diclofenac sodium (VOLTAREN) 1 % GEL Apply 2 g topically 3 (three) times daily as needed (shoulder pain).    Marland Kitchen FLUoxetine (PROZAC) 40 MG capsule Take 1 capsule (40 mg total) by mouth daily. 30 capsule 5  . gabapentin (NEURONTIN) 300 MG capsule Take 1 capsule (300 mg total) by mouth every morning AND 2 capsules  (600 mg total) at bedtime. (Patient taking differently: Take 600 mg by mouth at bedtime) 90 capsule 3  . Menthol (ICY HOT) 5 % [facility] Place 1 patch onto the skin daily as needed (shoulder pain).    Derrill Memo ON 03/31/2019] amphetamine-dextroamphetamine (ADDERALL) 15 MG tablet Take 1 tablet by mouth daily before breakfast. 30 tablet 0  . [START ON 02/28/2019] amphetamine-dextroamphetamine (ADDERALL) 15 MG tablet Take 1 tablet by mouth daily before breakfast. 30 tablet 0    No results found for this or any previous visit (from the past 48 hour(s)). No results found.  Review of Systems  Musculoskeletal: Positive for joint pain.  All other systems reviewed and are negative.   Blood pressure 119/70, pulse 65, temperature 98.4 F (36.9 C), temperature source Oral, resp. rate 17, height 6\' 1"  (1.854 m), weight 86.2 kg, SpO2 97 %. Physical Exam  Constitutional: He appears well-developed.  HENT:  Head: Normocephalic.  Eyes: Pupils are equal, round, and reactive to light.  Neck: Normal range of motion.  Cardiovascular: Normal rate.  Respiratory: Effort normal.  Neurological: He is alert.  Skin: Skin is warm.  Psychiatric: He has a normal mood and affect.     Assessment/Pla Impression is massive posterior superior rotator cuff tear with pain has a secondary complaint and strength loss as a primary complaint.  Patient does do physical work.  After  discussion with the patient the plan is for arthroscopy with biceps tendon release evaluation of the rotator cuff repair ability followed by likely lower trapezius tendon transfer.  The risk benefits are discussed including but not limited to infection nerve vessel damage incomplete restoration of function as well as incomplete pain relief.  Patient understands the risk and benefits.  All questions answered.  Anticipate overnight hospitalization.  He also has been fitted with an appropriate brace for this problem.  Coralee Rud, MD 02/16/2019, 11:48 AM

## 2019-02-17 ENCOUNTER — Encounter (HOSPITAL_COMMUNITY): Payer: Self-pay | Admitting: General Practice

## 2019-02-17 MED ORDER — CELECOXIB 200 MG PO CAPS: 200.0000 mg | ORAL_CAPSULE | Freq: Two times a day (BID) | ORAL | 0 refills | Status: DC

## 2019-02-17 MED ORDER — HYDROMORPHONE HCL 2 MG PO TABS: 2.0000 mg | ORAL_TABLET | ORAL | 0 refills | Status: DC | PRN

## 2019-02-17 MED ORDER — METHOCARBAMOL 500 MG PO TABS: 500.0000 mg | ORAL_TABLET | Freq: Three times a day (TID) | ORAL | 0 refills | Status: DC | PRN

## 2019-02-17 NOTE — Evaluation (Signed)
Occupational Therapy Evaluation Patient Details Name: Louis Zimmerman MRN: [id number] DOB: 07-18-1961 Today's Date: 02/17/2019    History of Present Illness Pt is a 58 y/o male s/p Left shoulder arthroscopy with release of the biceps tendon and limited debridement of the superior labrum followed by attempted rotator cuff repair of the infraspinatus and supraspinatus without success, followed by open biceps tenodesis and lower trapezius tendon transfer utilizing Achilles allograft.  has a past medical history of ADD (attention deficit disorder) and Depression.  has a past surgical history that includes Gallbladder surgery (2003).   Clinical Impression   PTA Pt independent in ADL and mobility. Pt is currently independent in mobility. Conservative Shoulder precautions (handout provided and explained differences between total shoulder and what he had done): Educated patient on don doff shirt with brace,washing under arms with cloth, never to wash directly on incision site, avoid shoulder movement by staying in brace at all times. positioning with pillows in chair for comfort, sleeping positioning (recommend recliner) pt educated on dressing care during bathing/ dressing.  Home exercise program as stated below (indicated by MD).  Of note: Pt's brace very ill fitting at time of evaluation, significant pressure on triceps and improperly aligned, called in additional therapist for attempted re-alignment without success. [employer] called and spoke with Ronalee Belts. Dr [name] came in at end of session and aware of situation and need for adjustment.       Follow Up Recommendations  Follow surgeon's recommendation for DC plan and follow-up therapies;Supervision - Intermittent    Equipment Recommendations  None recommended by OT    Recommendations for Other Services       Precautions / Restrictions Precautions Precautions: Shoulder Type of Shoulder Precautions: conservative Shoulder Interventions: At all  times(shoulder Abduction brace) Precaution Booklet Issued: Yes (comment) Precaution Comments: reviewed but explained differences with his sx Required Braces or Orthoses: Other Brace Other Brace: shoulder Abduction brace Restrictions Weight Bearing Restrictions: Yes LUE Weight Bearing: Non weight bearing Other Position/Activity Restrictions: stay in brace at all times for at least the first week      Mobility Bed Mobility Overal bed mobility: Modified Independent                Transfers Overall transfer level: Modified independent                    Balance Overall balance assessment: Independent                                         ADL either performed or assessed with clinical judgement   ADL                                         General ADL Comments: see shoulder section below     Vision Baseline Vision/History: Wears glasses Wears Glasses: At all times Patient Visual Report: No change from baseline       Perception     Praxis      Pertinent Vitals/Pain Pain Assessment: No/denies pain(block still in place)     Hand Dominance Right   Extremity/Trunk Assessment Upper Extremity Assessment Upper Extremity Assessment: LUE deficits/detail LUE Deficits / Details: block still in place, arm in abduction brace - improper fit LUE: Unable to fully assess due to immobilization LUE Sensation:  decreased light touch(block still in place) LUE Coordination: decreased gross motor   Lower Extremity Assessment Lower Extremity Assessment: Overall WFL for tasks assessed   Cervical / Trunk Assessment Cervical / Trunk Assessment: Normal   Communication Communication Communication: No difficulties   Cognition Arousal/Alertness: Awake/alert Behavior During Therapy: WFL for tasks assessed/performed Overall Cognitive Status: Within Functional Limits for tasks assessed                                      General Comments  brace is improperly fit    Exercises Exercises: Shoulder Shoulder Exercises Wrist Flexion: AROM;Left Wrist Extension: AROM;Left Digit Composite Flexion: AROM;Left Composite Extension: AROM;Left   Shoulder Instructions Shoulder Instructions Donning/doffing shirt without moving shoulder: Maximal assistance Method for sponge bathing under operated UE: Moderate assistance(stay in sling for first week) Donning/doffing sling/immobilizer: Maximal assistance Correct positioning of sling/immobilizer: Maximal assistance ROM for elbow, wrist and digits of operated UE: Supervision/safety(fingers, wrist) Sling wearing schedule (on at all times/off for ADL's): Independent(all the time at least for first week)    Home Living Family/patient expects to be discharged to:: Private residence Living Arrangements: Children Available Help at Discharge: Family;Available 24 hours/day Type of Home: House Home Access: Stairs to enter [employer] of Steps: 3   Home Layout: One level     Bathroom Shower/Tub: Teacher, early years/pre: Standard     Home Equipment: None          Prior Functioning/Environment Level of Independence: Independent                 OT Problem List: Decreased knowledge of use of DME or AE;Decreased knowledge of precautions;Impaired sensation;Impaired UE functional use;Pain      OT Treatment/Interventions:      OT Goals(Current goals can be found in the care plan section) Acute Rehab OT Goals Patient Stated Goal: "get this brace fitted right" OT Goal Formulation: With patient Time For Goal Achievement: 03/03/19 Potential to Achieve Goals: Good  OT Frequency:     Barriers to D/C:            Co-evaluation              AM-PAC OT "6 Clicks" Daily Activity     Outcome Measure Help from another person eating meals?: A Little Help from another person taking care of personal grooming?: A Little Help from another  person toileting, which includes using toliet, bedpan, or urinal?: A Little Help from another person bathing (including washing, rinsing, drying)?: A Lot Help from another person to put on and taking off regular upper body clothing?: A Lot Help from another person to put on and taking off regular lower body clothing?: A Little 6 Click Score: 16   End of Session Equipment Utilized During Treatment: Other (comment)(brace) Nurse Communication: Mobility status(Pt will go to [facility] and get brace re-fitted after dc)  Activity Tolerance: Patient tolerated treatment well Patient left: in bed  OT Visit Diagnosis: Pain Pain - Right/Left: Left Pain - part of body: Shoulder                Time: 8101-7510 OT Time Calculation (min): 27 min Charges:  OT General Charges $OT Visit: 1 Visit OT Evaluation $OT Eval Moderate Complexity: 1 Mod OT Treatments $Self Care/Home Management : 8-22 mins  Hulda Humphrey OTR/L Acute Rehabilitation Services Pager: [phone] Office: [place] 02/17/2019, 5:56 PM

## 2019-02-17 NOTE — Progress Notes (Signed)
Pt with c/o of LUE  ortho device, feeling very uncomfortable on the back of upper arm triceps area. Assessed area, noted hardware from device was pressed tightly against the area the patient had mentioned. Called out to [employer]. Manny at the bedside, stated he was not familiar with that particular device. Pt medicated for pain, call out to covering MD of advice. Dr. Lorin Mercy with a call back, okay to place a 4X4 gauze square  For relief.  Monitoring continued.

## 2019-02-17 NOTE — Plan of Care (Signed)
  Problem: Skin Integrity: Goal: Risk for impaired skin integrity will decrease Outcome: Progressing   Problem: Pain Managment: Goal: General experience of comfort will improve Outcome: Progressing   

## 2019-02-17 NOTE — Progress Notes (Signed)
Patient stable. Pain controlled. Plan at this time is to discharge home today.  He has been seen by occupational therapy.  I will have him not take shower until I see him back next week.  Dressings are waterproof.  He is going by to see Ronalee Belts at [place] to get the brace adjusted to 60 degrees of abduction and 60 degrees of external rotation. He is going to use Dilaudid on a as needed basis due to his other allergies.

## 2019-02-17 NOTE — Progress Notes (Signed)
  Subjective: Pt is a 58 y.o. Male s/p Lt Lower trapezius tendon transfer.  He is POD1.  Pt is resting in his brace but complains that it does not fit well.  Additionally, his arm keeps falling out of 60 degrees of abduction.  He notes numbness and tingling in his fingers and at his trapezius incision, that has improved since yesterday.  His pain is under control and he states that he wants to go home today.    Objective: Vital signs in last 24 hours: Temp:  [97.2 F (36.2 C)-98.4 F (36.9 C)] 98 F (36.7 C) (07/22 0509) Pulse Rate:  [62-83] 66 (07/22 0509) Resp:  [11-18] 16 (07/22 0509) BP: (95-160)/(64-91) 147/91 (07/22 0509) SpO2:  [96 %-100 %] 100 % (07/22 0509) Weight:  [86.2 kg] 86.2 kg (07/21 0947)  Intake/Output from previous day: 07/21 0701 - 07/22 0700 In: 2137.1 [P.O.:840; I.V.:1097.1; IV Piggyback:200] Out: -  Intake/Output this shift: No intake/output data recorded.  Exam:  2+ radial pulse of the LUE Sensation intact but diminished in the L hand Able to extend thumb, cross fingers, and give "ok" sign No gross drainage or blood visible in dressings Brace is ill-fitting in some areas, especially around the waist on the Rt side and around the elbow on his Lt side Brace allows arm to fall out of 60 degrees of abduction   Labs: No results for input(s): HGB in the last 72 hours. No results for input(s): WBC, RBC, HCT, PLT in the last 72 hours. No results for input(s): NA, K, CL, CO2, BUN, CREATININE, GLUCOSE, CALCIUM in the last 72 hours. No results for input(s): LABPT, INR in the last 72 hours.  Assessment/Plan: Pt is a 58 y.o. Male is POD1 s/p Lt Lower trapezius tendon transfer.  -Contacted [employer] to take a look at his brace  -Plan for discharge today  -Answered patient's questions to his satisfaction  -Okay to shower, dressing is waterproof.  Recommend someone help him hold his arm in position during showering.  -F/u with Dr. [name] in clinic in 2 weeks    Donella Stade 02/17/2019, 9:43 AM

## 2019-02-17 NOTE — Progress Notes (Signed)
RN gave patient discharge instructions and pt stated understanding and did not have any questions. PT IV have been removed and he packed all his belongings. Prescriptions have been escribed

## 2019-02-18 ENCOUNTER — Encounter (HOSPITAL_COMMUNITY): Payer: Self-pay | Admitting: Orthopedic Surgery

## 2019-02-18 ENCOUNTER — Encounter: Payer: Self-pay | Admitting: Orthopedic Surgery

## 2019-02-18 NOTE — Telephone Encounter (Signed)
Ok to rf pain meds on 7/27

## 2019-02-22 ENCOUNTER — Other Ambulatory Visit: Payer: Self-pay | Admitting: Orthopedic Surgery

## 2019-02-22 MED ORDER — HYDROMORPHONE HCL 2 MG PO TABS: 2.0000 mg | ORAL_TABLET | ORAL | 0 refills | Status: DC | PRN

## 2019-02-22 NOTE — Telephone Encounter (Signed)
Ok to rf pls call thx

## 2019-02-22 NOTE — Telephone Encounter (Signed)
Please advise. Thanks.  

## 2019-02-22 NOTE — Telephone Encounter (Signed)
Can you please submit electronically.  

## 2019-02-22 NOTE — Telephone Encounter (Signed)
This is a GD pt 

## 2019-02-24 ENCOUNTER — Encounter: Payer: Self-pay | Admitting: Orthopedic Surgery

## 2019-02-24 ENCOUNTER — Other Ambulatory Visit: Payer: Self-pay | Admitting: Surgical

## 2019-02-24 ENCOUNTER — Ambulatory Visit (INDEPENDENT_AMBULATORY_CARE_PROVIDER_SITE_OTHER): Payer: Self-pay | Admitting: Orthopedic Surgery

## 2019-02-24 ENCOUNTER — Telehealth: Payer: Self-pay | Admitting: Orthopedic Surgery

## 2019-02-24 ENCOUNTER — Other Ambulatory Visit: Payer: Self-pay

## 2019-02-24 ENCOUNTER — Telehealth: Payer: Self-pay

## 2019-02-24 DIAGNOSIS — S46012A Strain of muscle(s) and tendon(s) of the rotator cuff of left shoulder, initial encounter: Secondary | ICD-10-CM

## 2019-02-24 MED ORDER — OXYCODONE-ACETAMINOPHEN 5-325 MG PO TABS: 1.0000 | ORAL_TABLET | Freq: Three times a day (TID) | ORAL | 0 refills | Status: DC | PRN

## 2019-02-24 NOTE — Telephone Encounter (Signed)
Submitted

## 2019-02-24 NOTE — Telephone Encounter (Signed)
Patient here for [facility] F/U. Stated no one has ever asked him for [facility] info. He has none. No claim info on file. Does not know who/how to find out.

## 2019-02-24 NOTE — Progress Notes (Signed)
   Post-Op Visit Note   Patient: Louis Zimmerman           Date of Birth: 21-Jul-1961           MRN: [id number] Visit Date: 02/24/2019 PCP: Rutherford Guys, MD   Assessment & Plan:  Chief Complaint:  Chief Complaint  Patient presents with  . Left Shoulder - Routine Post Op   Visit Diagnoses:  1. Traumatic complete tear of left rotator cuff, initial encounter     Plan: Louis Zimmerman is a patient is now about 9 days out left shoulder lower trapezius transfer.  He is in a brace.  He is having the brace adjusted several times.  Taking pain medicine every 6 to 8 hours.  I am to refill him Percocet to be changed from Dilaudid.  Sounds like he is sleeping out of the brace.  Come back in 2 weeks for clinical recheck.  I will likely start him on some physical therapy at that time.  Follow-Up Instructions: Return in about 2 weeks (around 03/10/2019).   Orders:  No orders of the defined types were placed in this encounter.  No orders of the defined types were placed in this encounter.   Imaging: No results found.  PMFS History: Patient Active Problem List   Diagnosis Date Noted  . Rotator cuff arthropathy 02/16/2019  . FHx: colon cancer 12/18/2018  . Vasculitis ([place]) 06/26/2018  . AKI (acute kidney injury) ([place]) 06/26/2018  . 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 Attention deficit disorder (ADD) in adult 11/10/2017  . Moderate episode of recurrent major depressive disorder ([place]) 08/31/2017  . 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 Disturbed concentration 08/31/2017  . Tobacco use disorder 08/31/2017  . History of substance abuse ([name]) 02/23/2017  . Depression 02/02/2013   Past Medical History:  Diagnosis Date  . ADD (attention deficit disorder)   . Depression     Family History  Problem Relation Age of Onset  . Cancer Mother   . Dementia Mother   . Diabetes Mother   . Heart Problems Mother   . Liver cancer Father   . Colon cancer Father   . Alcohol abuse Maternal  Grandfather   . Alcohol abuse Paternal Grandfather   . Colon polyps Sister   . Colon polyps Sister     Past Surgical History:  Procedure Laterality Date  . CHOLECYSTECTOMY    . GALLBLADDER SURGERY  2003  . SHOULDER ARTHROSCOPY Left 02/16/2019   LEFT SHOULDER ARTHROSCOPY LOWER TRAPEZIUS TENDON TRANSFER, BICEPS TENODESIS  . SHOULDER ARTHROSCOPY WITH DEBRIDEMENT AND BICEP TENDON REPAIR Left 02/16/2019   Procedure: LEFT SHOULDER ARTHROSCOPY LOWER TRAPEZIUS TENDON TRANSFER, BICEPS TENODESIS;  Surgeon: Meredith Pel, MD;  Location: [place];  Service: Orthopedics;  Laterality: Left;  . SKIN BIOPSY  2019   Social History   Occupational History  . Not on file  Tobacco Use  . Smoking status: Current Every Day Smoker    Packs/day: 2.00    Types: Cigarettes  . Smokeless tobacco: Never Used  Substance and Sexual Activity  . Alcohol use: No  . Drug use: No    Frequency: 7.0 times per week    Types: IV    Comment: history of Rx opoid dependency  . Sexual activity: Not on file

## 2019-02-24 NOTE — Telephone Encounter (Signed)
Patient seen by Dr [name] for post op check today He needs pain meds submitted to his pharmacy electronically. [facility] on [address] 1 po q 8 hrs prn pain #30 to be filled on 03/02/19.

## 2019-02-25 DIAGNOSIS — M7521 Bicipital tendinitis, right shoulder: Secondary | ICD-10-CM

## 2019-02-25 DIAGNOSIS — M7522 Bicipital tendinitis, left shoulder: Secondary | ICD-10-CM

## 2019-02-28 NOTE — Discharge Summary (Signed)
Physician Discharge Summary      Patient ID: Louis Zimmerman MRN: [id number] DOB/AGE: 1961-03-17 58 y.o.  Admit date: 02/16/2019 Discharge date: 02/17/2019  admission Diagnoses:  Active Problems:   Rotator cuff arthropathy   Biceps tendinitis of left upper extremity   Discharge Diagnoses:  Same  Surgeries: Procedure(s): LEFT SHOULDER ARTHROSCOPY LOWER TRAPEZIUS TENDON TRANSFER, BICEPS TENODESIS on 02/16/2019   Consultants:   Discharged Condition: Stable  Hospital Course: IZEN PETZ is an 58 y.o. male who was admitted 02/16/2019 with a chief complaint of left shoulder pain and weakness, and found to have a diagnosis of massive unrepairable rotator cuff tear.  They were brought to the operating room on 02/16/2019 and underwent the above named procedures patient tolerated procedure well without immediate complication.  He was fitted with a postoperative brace for left shoulder abduction external rotation.  He will continue with his brace and follow-up with [place] in about 10 days.  Discharged home in good condition with pain medicine muscle relaxers and aspirin for DVT prophylaxis.    Antibiotics given:  Anti-infectives (From admission, onward)   Start     Dose/Rate Route Frequency Ordered Stop   02/16/19 1930  ceFAZolin (ANCEF) IVPB 2g/100 mL premix     2 g 200 mL/hr over 30 Minutes Intravenous Every 6 hours 02/16/19 1838 02/17/19 0230   02/16/19 0945  ceFAZolin (ANCEF) IVPB 2g/100 mL premix     2 g 200 mL/hr over 30 Minutes Intravenous On call to O.R. 02/16/19 0941 02/16/19 1705   02/16/19 0944  ceFAZolin (ANCEF) 2-4 GM/100ML-% IVPB    Note to Pharmacy: Lorne Skeens   : cabinet override      02/16/19 0944 02/16/19 1302    .  Recent vital signs:  Vitals:   02/16/19 2202 02/17/19 0509  BP: 110/74 (!) 147/91  Pulse: 72 66  Resp: 16 16  Temp: 98.2 F (36.8 C) 98 F (36.7 C)  SpO2: 98% 100%    Recent laboratory studies:  Results for orders placed or performed during  the hospital encounter of 02/12/19  SARS Coronavirus 2 (Performed in [place] hospital lab)   Specimen: Nasal Swab  Result Value Ref Range   SARS Coronavirus 2 NEGATIVE NEGATIVE    Discharge Medications:   Allergies as of 02/17/2019      Reactions   Hydrocodone Anaphylaxis      Medication List    STOP taking these medications   diclofenac sodium 1 % Gel Commonly known as: VOLTAREN   Icy Hot 5 % Ptch Generic drug: Menthol     TAKE these medications   amphetamine-dextroamphetamine 15 MG tablet Commonly known as: Adderall Take 1 tablet by mouth daily before breakfast. What changed: Another medication with the same name was removed. Continue taking this medication, and follow the directions you see here.   celecoxib 200 MG capsule Commonly known as: CELEBREX Take 1 capsule (200 mg total) by mouth 2 (two) times daily.   FLUoxetine 40 MG capsule Commonly known as: PROZAC Take 1 capsule (40 mg total) by mouth daily.   gabapentin 300 MG capsule Commonly known as: NEURONTIN Take 1 capsule (300 mg total) by mouth every morning AND 2 capsules (600 mg total) at bedtime. What changed: See the new instructions.   methocarbamol 500 MG tablet Commonly known as: ROBAXIN Take 1 tablet (500 mg total) by mouth every 8 (eight) hours as needed for muscle spasms.       Diagnostic Studies: No results found.  Disposition:  Discharge Instructions    Call MD / Call 911   Complete by: As directed    If you experience chest pain or shortness of breath, CALL 911 and be transported to the hospital emergency room.  If you develope a fever above 101 F, pus (white drainage) or increased drainage or redness at the wound, or calf pain, call your surgeon's office.   Constipation Prevention   Complete by: As directed    Drink plenty of fluids.  Prune juice may be helpful.  You may use a stool softener, such as Colace (over the counter) 100 mg twice a day.  Use MiraLax (over the counter) for  constipation as needed.   Diet - low sodium heart healthy   Complete by: As directed    Discharge instructions   Complete by: As directed    Keep left arm in brace Okay to to get the dressings wet with a sponge bath.  They will waterproof Dilaudid and Robaxin for pain and muscle spasm Follow-up with me next week   Increase activity slowly as tolerated   Complete by: As directed          Signed: Anderson Malta 02/28/2019, 12:11 PM

## 2019-03-03 ENCOUNTER — Inpatient Hospital Stay: Payer: BC Managed Care – PPO | Admitting: Orthopedic Surgery

## 2019-03-10 ENCOUNTER — Encounter: Payer: Self-pay | Admitting: Orthopedic Surgery

## 2019-03-10 ENCOUNTER — Ambulatory Visit (INDEPENDENT_AMBULATORY_CARE_PROVIDER_SITE_OTHER): Payer: Self-pay | Admitting: Orthopedic Surgery

## 2019-03-10 DIAGNOSIS — S46012A Strain of muscle(s) and tendon(s) of the rotator cuff of left shoulder, initial encounter: Secondary | ICD-10-CM

## 2019-03-10 NOTE — Progress Notes (Signed)
Post-Op Visit Note   Patient: Louis Zimmerman           Date of Birth: 1960/10/21           MRN: [id number] Visit Date: 03/10/2019 PCP: Rutherford Guys, MD   Assessment & Plan:  Chief Complaint:  Chief Complaint  Patient presents with  . Left Shoulder - Follow-up   Visit Diagnoses: No diagnosis found.  Plan: Patient is a 58 year old male who presents s/p left shoulder scope with lower trapezius tendon transfer on 02/16/2019.  Patient is doing well with little pain today.  He has not been taking oxycodone at all recently.  Patient does complain of pain in his neck and right scapular pain that is likely due to his positioning due to his brace.  He has been using topical Voltaren and Celebrex for pain.  Patient's brace is fitting much better than it was at his past visit, but it still becomes loose every couple days.  He has a small ulcer in his posterior left arm but it does not seem to be infected.  Will re-eval at next office visit.  His incisions have healed well.  Patient will begin outpatient therapy; patient given a protocol for lower trapezius tendon transfer to give to his physical therapist.  Patient can discontinue his brace in 2 weeks, which will mark 6 weeks since his surgery.  He will follow-up with the office in 4 weeks.  Follow-Up Instructions: No follow-ups on file.   Orders:  No orders of the defined types were placed in this encounter.  No orders of the defined types were placed in this encounter.   Imaging: No results found.  PMFS History: Patient Active Problem List   Diagnosis Date Noted  . Biceps tendinitis of left upper extremity   . Rotator cuff arthropathy 02/16/2019  . FHx: colon cancer 12/18/2018  . Vasculitis ([place]) 06/26/2018  . AKI (acute kidney injury) ([place]) 06/26/2018  . 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 Attention deficit disorder (ADD) in adult 11/10/2017  . Moderate episode of recurrent major depressive disorder ([place]) 08/31/2017  . Other  mixed anxiety disorders 08/31/2017  . Disturbed concentration 08/31/2017  . Tobacco use disorder 08/31/2017  . History of substance abuse ([place]) 02/23/2017  . Depression 02/02/2013   Past Medical History:  Diagnosis Date  . ADD (attention deficit disorder)   . Depression     Family History  Problem Relation Age of Onset  . Cancer Mother   . Dementia Mother   . Diabetes Mother   . Heart Problems Mother   . Liver cancer Father   . Colon cancer Father   . Alcohol abuse Maternal Grandfather   . Alcohol abuse Paternal Grandfather   . Colon polyps Sister   . Colon polyps Sister     Past Surgical History:  Procedure Laterality Date  . CHOLECYSTECTOMY    . GALLBLADDER SURGERY  2003  . SHOULDER ARTHROSCOPY Left 02/16/2019   LEFT SHOULDER ARTHROSCOPY LOWER TRAPEZIUS TENDON TRANSFER, BICEPS TENODESIS  . SHOULDER ARTHROSCOPY WITH DEBRIDEMENT AND BICEP TENDON REPAIR Left 02/16/2019   Procedure: LEFT SHOULDER ARTHROSCOPY LOWER TRAPEZIUS TENDON TRANSFER, BICEPS TENODESIS;  Surgeon: Meredith Pel, MD;  Location: [place];  Service: Orthopedics;  Laterality: Left;  . SKIN BIOPSY  2019   Social History   Occupational History  . Not on file  Tobacco Use  . Smoking status: Current Every Day Smoker    Packs/day: 2.00    Types: Cigarettes  . Smokeless  tobacco: Never Used  Substance and Sexual Activity  . Alcohol use: No  . Drug use: No    Frequency: 7.0 times per week    Types: IV    Comment: history of Rx opoid dependency  . Sexual activity: Not on file

## 2019-03-12 ENCOUNTER — Encounter: Payer: Self-pay | Admitting: Orthopedic Surgery

## 2019-03-22 ENCOUNTER — Other Ambulatory Visit: Payer: Self-pay | Admitting: Orthopedic Surgery

## 2019-03-22 ENCOUNTER — Other Ambulatory Visit: Payer: Self-pay

## 2019-03-23 MED ORDER — OXYCODONE-ACETAMINOPHEN 5-325 MG PO TABS: 1.0000 | ORAL_TABLET | Freq: Three times a day (TID) | ORAL | 0 refills | Status: DC | PRN

## 2019-03-23 MED ORDER — CELECOXIB 200 MG PO CAPS: 200.0000 mg | ORAL_CAPSULE | Freq: Two times a day (BID) | ORAL | 0 refills | Status: DC

## 2019-03-23 MED ORDER — METHOCARBAMOL 500 MG PO TABS: 500.0000 mg | ORAL_TABLET | Freq: Three times a day (TID) | ORAL | 0 refills | Status: DC | PRN

## 2019-03-23 NOTE — Telephone Encounter (Signed)
Dean pt 

## 2019-03-23 NOTE — Telephone Encounter (Signed)
Can you please submit electronically.  

## 2019-03-23 NOTE — Telephone Encounter (Signed)
y

## 2019-03-23 NOTE — Telephone Encounter (Signed)
Ok to rf? 

## 2019-04-07 ENCOUNTER — Encounter: Payer: Self-pay | Admitting: Orthopedic Surgery

## 2019-04-07 ENCOUNTER — Other Ambulatory Visit: Payer: Self-pay

## 2019-04-07 ENCOUNTER — Ambulatory Visit (INDEPENDENT_AMBULATORY_CARE_PROVIDER_SITE_OTHER): Payer: Self-pay | Admitting: Orthopedic Surgery

## 2019-04-07 DIAGNOSIS — M898X1 Other specified disorders of bone, shoulder: Secondary | ICD-10-CM

## 2019-04-07 MED ORDER — OXYCODONE-ACETAMINOPHEN 5-325 MG PO TABS: 1.0000 | ORAL_TABLET | Freq: Every evening | ORAL | 0 refills | Status: DC | PRN

## 2019-04-07 NOTE — Progress Notes (Signed)
Post-Op Visit Note   Patient: Louis Zimmerman           Date of Birth: 1961-05-22           MRN: [id number] Visit Date: 04/07/2019 PCP: Rutherford Guys, MD   Assessment & Plan:  Chief Complaint:  Chief Complaint  Patient presents with  . Left Shoulder - Follow-up   Visit Diagnoses: No diagnosis found.  Plan: Patient is a 58 year old male who presents s/p left lower trapezius tendon transfer on 02/16/2019.  Patient states that his left shoulder is doing very well but his right shoulder, specifically the inferior aspect of the right shoulder blade, is causing him significant pain.  As for the left shoulder, his incisions are healing very well.  He has 5/5 motor strength in the plane of the infraspinatus tendon with the use of his new tendon transfer.  He is still having some difficulty with forward flexion of the left shoulder.  However it is early and he still needs significant amount of therapy in order to train his muscle to move his left shoulder in this plane.  Outpatient physical therapy was too expensive for the patient as he is unemployed at this time.  Provided patient with a therapy protocol and walked him through all the exercises.  Patient was prescribed oxycodone to take at night as needed for sleep.  An ultrasound guided cortisone injection was administered to the right inferior shoulder blade.  He will follow-up in 6 weeks for clinical recheck.  Follow-Up Instructions: No follow-ups on file.   Orders:  No orders of the defined types were placed in this encounter.  Meds ordered this encounter  Medications  . oxyCODONE-acetaminophen (PERCOCET/ROXICET) 5-325 MG tablet    Sig: Take 1 tablet by mouth at bedtime as needed for severe pain.    Dispense:  30 tablet    Refill:  0    Imaging: No results found.  PMFS History: Patient Active Problem List   Diagnosis Date Noted  . Biceps tendinitis of left upper extremity   . Rotator cuff arthropathy 02/16/2019  . FHx:  colon cancer 12/18/2018  . Vasculitis ([place]) 06/26/2018  . AKI (acute kidney injury) ([place]) 06/26/2018  . 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 Attention deficit disorder (ADD) in adult 11/10/2017  . Moderate episode of recurrent major depressive disorder ([place]) 08/31/2017  . 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 Disturbed concentration 08/31/2017  . Tobacco use disorder 08/31/2017  . History of substance abuse ([place]) 02/23/2017  . Depression 02/02/2013   Past Medical History:  Diagnosis Date  . ADD (attention deficit disorder)   . Depression     Family History  Problem Relation Age of Onset  . Cancer Mother   . Dementia Mother   . Diabetes Mother   . Heart Problems Mother   . Liver cancer Father   . Colon cancer Father   . Alcohol abuse Maternal Grandfather   . Alcohol abuse Paternal Grandfather   . Colon polyps Sister   . Colon polyps Sister     Past Surgical History:  Procedure Laterality Date  . CHOLECYSTECTOMY    . GALLBLADDER SURGERY  2003  . SHOULDER ARTHROSCOPY Left 02/16/2019   LEFT SHOULDER ARTHROSCOPY LOWER TRAPEZIUS TENDON TRANSFER, BICEPS TENODESIS  . SHOULDER ARTHROSCOPY WITH DEBRIDEMENT AND BICEP TENDON REPAIR Left 02/16/2019   Procedure: LEFT SHOULDER ARTHROSCOPY LOWER TRAPEZIUS TENDON TRANSFER, BICEPS TENODESIS;  Surgeon: Meredith Pel, MD;  Location: [facility];  Service: Orthopedics;  Laterality:  Left;  . SKIN BIOPSY  2019   Social History   Occupational History  . Not on file  Tobacco Use  . Smoking status: Current Every Day Smoker    Packs/day: 2.00    Types: Cigarettes  . Smokeless tobacco: Never Used  Substance and Sexual Activity  . Alcohol use: No  . Drug use: No    Frequency: 7.0 times per week    Types: IV    Comment: history of Rx opoid dependency  . Sexual activity: Not on file

## 2019-04-09 ENCOUNTER — Encounter: Payer: Self-pay | Admitting: Orthopedic Surgery

## 2019-04-09 NOTE — Progress Notes (Signed)
   Procedure Note  Patient: Louis Zimmerman             Date of Birth: 12/22/1960           MRN: [id number]             Visit Date: 04/07/2019  Procedures: Visit Diagnoses: No diagnosis found.  Large Joint Inj (Inferior tip of Right scapula) on 04/09/2019 5:47 PM Indications: diagnostic evaluation and pain Details: 25 G 1.5 in needle, ultrasound-guided posterior approach  Arthrogram: No  Medications: 2 mL lidocaine 1 %; 13.33 mg methylPREDNISolone acetate 40 MG/ML; 0.66 mL bupivacaine 0.25 % Outcome: tolerated well, no immediate complications Procedure, treatment alternatives, risks and benefits explained, specific risks discussed. Consent was given by the patient. Immediately prior to procedure a time out was called to verify the correct patient, procedure, equipment, support staff and site/side marked as required. Patient was prepped and draped in the usual sterile fashion.

## 2019-04-12 ENCOUNTER — Encounter: Payer: Self-pay | Admitting: Orthopedic Surgery

## 2019-04-12 MED ORDER — METHYLPREDNISOLONE ACETATE 40 MG/ML IJ SUSP
13.3300 mg | INTRAMUSCULAR | Status: AC | PRN
  Administered 2019-04-09: 13.33 mg via INTRA_ARTICULAR

## 2019-04-12 MED ORDER — LIDOCAINE HCL 1 % IJ SOLN
2.0000 mL | INTRAMUSCULAR | Status: AC | PRN
  Administered 2019-04-09: 2 mL

## 2019-04-12 MED ORDER — BUPIVACAINE HCL 0.25 % IJ SOLN
0.6600 mL | INTRAMUSCULAR | Status: AC | PRN
  Administered 2019-04-09: .66 mL via INTRA_ARTICULAR

## 2019-04-22 ENCOUNTER — Other Ambulatory Visit: Payer: Self-pay | Admitting: Orthopedic Surgery

## 2019-04-22 ENCOUNTER — Other Ambulatory Visit: Payer: Self-pay

## 2019-04-22 MED ORDER — OXYCODONE-ACETAMINOPHEN 5-325 MG PO TABS: 1.0000 | ORAL_TABLET | Freq: Every evening | ORAL | 0 refills | Status: DC | PRN

## 2019-04-22 MED ORDER — CELECOXIB 200 MG PO CAPS: 200.0000 mg | ORAL_CAPSULE | Freq: Two times a day (BID) | ORAL | 0 refills | Status: DC

## 2019-04-22 NOTE — Telephone Encounter (Signed)
Dean patient 

## 2019-04-22 NOTE — Telephone Encounter (Signed)
Ok to rf? 

## 2019-05-19 ENCOUNTER — Other Ambulatory Visit: Payer: Self-pay

## 2019-05-19 ENCOUNTER — Encounter: Payer: Self-pay | Admitting: Orthopedic Surgery

## 2019-05-19 ENCOUNTER — Telehealth: Payer: Self-pay | Admitting: Orthopedic Surgery

## 2019-05-19 ENCOUNTER — Ambulatory Visit (INDEPENDENT_AMBULATORY_CARE_PROVIDER_SITE_OTHER): Payer: Self-pay | Admitting: Orthopedic Surgery

## 2019-05-19 DIAGNOSIS — S46012A Strain of muscle(s) and tendon(s) of the rotator cuff of left shoulder, initial encounter: Secondary | ICD-10-CM

## 2019-05-19 NOTE — Telephone Encounter (Signed)
Received vm from Doylene Canning w/ [employer] checking status of request. I called her back [phone] and advised their request was process by [facility]. I gave her their phone number.

## 2019-05-19 NOTE — Progress Notes (Signed)
Post-Op Visit Note   Patient: Louis Zimmerman           Date of Birth: 03/05/1961           MRN: [id number] Visit Date: 05/19/2019 PCP: Rutherford Guys, MD   Assessment & Plan:  Chief Complaint:  Chief Complaint  Patient presents with  . Follow-up   Visit Diagnoses: No diagnosis found.  Plan: [name] is now about 3 months out left shoulder lower trapezius transfer.  He has been doing well.  On exam he has excellent strength 5- out of 5 on the left 5+ out of 5 on the right to external rotation.  Forward flexion abduction both above 90 and generally symmetric with the right-hand side.  No real coarse grinding or crepitus with passive range of motion.  Trapezius muscle fires with external rotation.  He does carry that left shoulder slightly higher than the right-hand side but it is only about slightly less than a centimeter.  No scapular winging.  Plan at this time is to be very careful with this well-functioning tendon transfer.  I will see him back in 3 months for final check.  He is doing therapy on his own.  Follow-Up Instructions: No follow-ups on file.   Orders:  No orders of the defined types were placed in this encounter.  No orders of the defined types were placed in this encounter.   Imaging: No results found.  PMFS History: Patient Active Problem List   Diagnosis Date Noted  . Biceps tendinitis of left upper extremity   . Rotator cuff arthropathy 02/16/2019  . FHx: colon cancer 12/18/2018  . Vasculitis ([place]) 06/26/2018  . AKI (acute kidney injury) ([place]) 06/26/2018  . 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 Attention deficit disorder (ADD) in adult 11/10/2017  . Moderate episode of recurrent major depressive disorder ([place]) 08/31/2017  . 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 Disturbed concentration 08/31/2017  . Tobacco use disorder 08/31/2017  . History of substance abuse ([place]) 02/23/2017  . Depression 02/02/2013   Past Medical History:  Diagnosis Date  .  ADD (attention deficit disorder)   . Depression     Family History  Problem Relation Age of Onset  . Cancer Mother   . Dementia Mother   . Diabetes Mother   . Heart Problems Mother   . Liver cancer Father   . Colon cancer Father   . Alcohol abuse Maternal Grandfather   . Alcohol abuse Paternal Grandfather   . Colon polyps Sister   . Colon polyps Sister     Past Surgical History:  Procedure Laterality Date  . CHOLECYSTECTOMY    . GALLBLADDER SURGERY  2003  . SHOULDER ARTHROSCOPY Left 02/16/2019   LEFT SHOULDER ARTHROSCOPY LOWER TRAPEZIUS TENDON TRANSFER, BICEPS TENODESIS  . SHOULDER ARTHROSCOPY WITH DEBRIDEMENT AND BICEP TENDON REPAIR Left 02/16/2019   Procedure: LEFT SHOULDER ARTHROSCOPY LOWER TRAPEZIUS TENDON TRANSFER, BICEPS TENODESIS;  Surgeon: Meredith Pel, MD;  Location: [facility];  Service: Orthopedics;  Laterality: Left;  . SKIN BIOPSY  2019   Social History   Occupational History  . Not on file  Tobacco Use  . Smoking status: Current Every Day Smoker    Packs/day: 2.00    Types: Cigarettes  . Smokeless tobacco: Never Used  Substance and Sexual Activity  . Alcohol use: No  . Drug use: No    Frequency: 7.0 times per week    Types: IV    Comment: history of Rx opoid dependency  .  Sexual activity: Not on file

## 2019-08-02 ENCOUNTER — Other Ambulatory Visit: Payer: Self-pay | Admitting: Family Medicine

## 2019-08-02 NOTE — Telephone Encounter (Signed)
Requested medication (s) are due for refill today  yes  Requested medication (s) are on the active medication list: yes  Last refill: 02/11/2019  #30  0 refills  Future visit scheduled Yes  Notes to clinic:not delegated  Requested Prescriptions  Pending Prescriptions Disp Refills   amphetamine-dextroamphetamine (ADDERALL) 15 MG tablet [Pharmacy Med Name: Amphetamine-Dextroamphetamine Oral Tablet 15 MG] 30 tablet 0    Sig: TAKE ONE TABLET BY MOUTH DAILY BEFORE BREAKFAST      Not Delegated - Psychiatry:  Stimulants/ADHD Failed - 08/02/2019  4:27 PM      Failed - This refill cannot be delegated      Failed - Urine Drug Screen completed in last 360 days.      Failed - Valid encounter within last 3 months    Recent Outpatient Visits           5 months ago Attention deficit disorder (ADD) in adult   [facility], Lilia Argue, MD   7 months ago Screen for colon cancer   [facility], Lilia Argue, MD   7 months ago Chronic left shoulder pain   [facility], Lilia Argue, MD   10 months ago Attention deficit disorder (ADD) in adult   [facility], Lilia Argue, MD   1 year ago Attention deficit disorder (ADD) in adult   [facility], Lilia Argue, MD       Future Appointments             In 2 weeks Rutherford Guys, MD [facility] [place], [facility]   In 2 weeks [name], Tonna Corner, MD [facility]

## 2019-08-05 NOTE — Telephone Encounter (Signed)
pmp reviewd, appropriate meds refilled Has routine app on jan 21st

## 2019-08-19 ENCOUNTER — Ambulatory Visit (INDEPENDENT_AMBULATORY_CARE_PROVIDER_SITE_OTHER): Payer: Self-pay | Admitting: Family Medicine

## 2019-08-19 ENCOUNTER — Other Ambulatory Visit: Payer: Self-pay

## 2019-08-19 ENCOUNTER — Encounter: Payer: Self-pay | Admitting: Family Medicine

## 2019-08-19 ENCOUNTER — Ambulatory Visit: Payer: Self-pay | Admitting: Orthopedic Surgery

## 2019-08-19 VITALS — BP 110/75 | HR 79 | Temp 97.9°F | Ht 73.0 in | Wt 188.6 lb

## 2019-08-19 DIAGNOSIS — M12812 Other specific arthropathies, not elsewhere classified, left shoulder: Secondary | ICD-10-CM

## 2019-08-19 DIAGNOSIS — F331 Major depressive disorder, recurrent, moderate: Secondary | ICD-10-CM

## 2019-08-19 DIAGNOSIS — F988 Other specified behavioral and emotional disorders with onset usually occurring in childhood and adolescence: Secondary | ICD-10-CM

## 2019-08-19 MED ORDER — SERTRALINE HCL 50 MG PO TABS: 50.0000 mg | ORAL_TABLET | Freq: Every day | ORAL | 3 refills | Status: DC

## 2019-08-19 MED ORDER — AMPHETAMINE-DEXTROAMPHETAMINE 15 MG PO TABS: 15.0000 mg | ORAL_TABLET | Freq: Every day | ORAL | 0 refills | Status: DC

## 2019-08-19 MED ORDER — GABAPENTIN 300 MG PO CAPS: ORAL_CAPSULE | ORAL | 3 refills | Status: DC

## 2019-08-19 NOTE — Patient Instructions (Signed)
° ° ° °  If you have lab work done today you will be contacted with your lab results within the next 2 weeks.  If you have not heard from us then please contact us. The fastest way to get your results is to register for My Chart. ° ° °IF you received an x-ray today, you will receive an invoice from [facility]. Please contact [facility] at [phone] with questions or concerns regarding your invoice.  ° °IF you received labwork today, you will receive an invoice from [facility]. Please contact [facility] at [phone] with questions or concerns regarding your invoice.  ° °Our billing staff will not be able to assist you with questions regarding bills from these companies. ° °You will be contacted with the lab results as soon as they are available. The fastest way to get your results is to activate your My Chart account. Instructions are located on the last page of this paperwork. If you have not heard from us regarding the results in 2 weeks, please contact this office. °  ° ° ° °

## 2019-08-19 NOTE — Progress Notes (Signed)
1/21/20218:14 AM  Louis Zimmerman Sep 09, 1960, 59 y.o., male [id number]  Chief Complaint  Patient presents with  . ADD    needing refill on ADD med    HPI:   Patient is a 59 y.o. male with past medical history significant for ADD and depression who presents today for routine followup  Last OV july 2020 - telemedicine Being seen by ortho for traumatic complete LEFT rotator cuff tear s/p repair, last OV oct 2020 - good ROM and function, still has pain  Patient reports that he has overall been doing well  He has only been taking adderall for about 2 months due to not having insurance His depression was getting worse so he tried to increase his prozac but unsure of what dose He is currently off prozac He would like to try to change SSRI as he feels it in general had stopped taking it He has been on adderall for years, he does not tend to take during the weekend He also had to stop taking gabapentin due to finances - requesting refill He however is back to work but still has no health insurance pmp reviewed  He has lost his insurance as he was fired due to his left shoulder therefore unable to have colonoscopy done  He has a workmens comp case coming up in feb  He stopped having nightsweats once he stopped prozac and gabapentin  Depression screen [facility] 2/9 08/19/2019 02/11/2019 12/18/2018  Decreased Interest 0 0 0  Down, Depressed, Hopeless 0 0 0  PHQ - 2 Score 0 0 0  Altered sleeping - - -  Tired, decreased energy - - -  Change in appetite - - -  Feeling bad or failure about yourself  - - -  Trouble concentrating - - -  Moving slowly or fidgety/restless - - -  Suicidal thoughts - - -  PHQ-9 Score - - -  Difficult doing work/chores - - -    Fall Risk  08/19/2019 12/18/2018 12/10/2018 05/28/2018 03/28/2018  Falls in the past year? 0 0 0 No No  Number falls in past yr: 0 0 0 - -  Injury with Fall? 0 0 0 - -     Allergies  Allergen Reactions  . Hydrocodone Anaphylaxis     Prior to Admission medications   Medication Sig Start Date End Date Taking? Authorizing Provider  amphetamine-dextroamphetamine (ADDERALL) 15 MG tablet TAKE ONE TABLET BY MOUTH DAILY BEFORE BREAKFAST  08/05/19  Yes Rutherford Guys, MD  FLUoxetine (PROZAC) 40 MG capsule Take 1 capsule (40 mg total) by mouth daily. 02/11/19  Yes Rutherford Guys, MD  gabapentin (NEURONTIN) 300 MG capsule Take 1 capsule (300 mg total) by mouth every morning AND 2 capsules (600 mg total) at bedtime. Patient taking differently: Take 600 mg by mouth at bedtime 01/13/19  Yes Rutherford Guys, MD    Past Medical History:  Diagnosis Date  . ADD (attention deficit disorder)   . Depression     Past Surgical History:  Procedure Laterality Date  . CHOLECYSTECTOMY    . GALLBLADDER SURGERY  2003  . SHOULDER ARTHROSCOPY Left 02/16/2019   LEFT SHOULDER ARTHROSCOPY LOWER TRAPEZIUS TENDON TRANSFER, BICEPS TENODESIS  . SHOULDER ARTHROSCOPY WITH DEBRIDEMENT AND BICEP TENDON REPAIR Left 02/16/2019   Procedure: LEFT SHOULDER ARTHROSCOPY LOWER TRAPEZIUS TENDON TRANSFER, BICEPS TENODESIS;  Surgeon: Meredith Pel, MD;  Location: [facility];  Service: Orthopedics;  Laterality: Left;  . SKIN BIOPSY  2019    Social  History   Tobacco Use  . Smoking status: Current Every Day Smoker    Packs/day: 2.00    Types: Cigarettes  . Smokeless tobacco: Never Used  Substance Use Topics  . Alcohol use: No    Family History  Problem Relation Age of Onset  . Cancer Mother   . Dementia Mother   . Diabetes Mother   . Heart Problems Mother   . Liver cancer Father   . Colon cancer Father   . Alcohol abuse Maternal Grandfather   . Alcohol abuse Paternal Grandfather   . Colon polyps Sister   . Colon polyps Sister     ROS Per hpi  OBJECTIVE:  Today's Vitals   08/19/19 0809  BP: 110/75  Pulse: 79  Temp: 97.9 F (36.6 C)  SpO2: 99%  Weight: 188 lb 9.6 oz (85.5 kg)  Height: 6\' 1"  (1.854 m)   Body mass index is 24.88 kg/m.    Physical Exam Vitals and nursing note reviewed.  Constitutional:      Appearance: He is well-developed.  HENT:     Head: Normocephalic and atraumatic.  Eyes:     Conjunctiva/sclera: Conjunctivae normal.     Pupils: Pupils are equal, round, and reactive to light.  Pulmonary:     Effort: Pulmonary effort is normal.  Musculoskeletal:     Cervical back: Neck supple.  Skin:    General: Skin is warm and dry.  Neurological:     Mental Status: He is alert and oriented to person, place, and time.     No results found for this or any previous visit (from the past 24 hour(s)).  No results found.   ASSESSMENT and PLAN  1. Attention deficit disorder (ADD) in adult Controlled. Continue current regime.   2. Moderate episode of recurrent major depressive disorder ([place]) Not controlled. Off medications, no SI. Changing SSRIs is reasonable, trial of sertraline. Consider duloxetine given pain, however currently limited by lack of insurance. Reviewed new meds r/se/b. Mindful of diaphoresis as it might have been a side effect of prozac.   3. Rotator cuff arthropathy of left shoulder S/p repair. Gabapentin for pain, reviewed r/se/b.  Other orders - amphetamine-dextroamphetamine (ADDERALL) 15 MG tablet; Take 1 tablet by mouth daily before breakfast. - amphetamine-dextroamphetamine (ADDERALL) 15 MG tablet; Take 1 tablet by mouth daily before breakfast. - amphetamine-dextroamphetamine (ADDERALL) 15 MG tablet; Take 1 tablet by mouth daily before breakfast. - sertraline (ZOLOFT) 50 MG tablet; Take 1 tablet (50 mg total) by mouth daily. - gabapentin (NEURONTIN) 300 MG capsule; Take 1 capsule (300 mg total) by mouth every morning AND 2 capsules (600 mg total) at bedtime.  Return in about 4 weeks (around 09/16/2019) for depression, virtual visit ok.    Rutherford Guys, MD Primary Care at [facility] [place], [place] [address] Ph.  [phone] Fax [phone]

## 2019-08-25 ENCOUNTER — Other Ambulatory Visit: Payer: Self-pay

## 2019-08-25 ENCOUNTER — Encounter: Payer: Self-pay | Admitting: Orthopedic Surgery

## 2019-08-25 ENCOUNTER — Ambulatory Visit (INDEPENDENT_AMBULATORY_CARE_PROVIDER_SITE_OTHER): Payer: Self-pay | Admitting: Orthopedic Surgery

## 2019-08-25 VITALS — Ht 73.0 in | Wt 185.0 lb

## 2019-08-25 DIAGNOSIS — S46012A Strain of muscle(s) and tendon(s) of the rotator cuff of left shoulder, initial encounter: Secondary | ICD-10-CM

## 2019-08-29 ENCOUNTER — Encounter: Payer: Self-pay | Admitting: Orthopedic Surgery

## 2019-08-29 DIAGNOSIS — S46012A Strain of muscle(s) and tendon(s) of the rotator cuff of left shoulder, initial encounter: Secondary | ICD-10-CM

## 2019-08-29 MED ORDER — BUPIVACAINE HCL 0.5 % IJ SOLN
2.0000 mL | INTRAMUSCULAR | Status: AC | PRN
  Administered 2019-08-29: 12:00:00 2 mL

## 2019-08-29 MED ORDER — LIDOCAINE HCL 1 % IJ SOLN
3.0000 mL | INTRAMUSCULAR | Status: AC | PRN
  Administered 2019-08-29: 12:00:00 3 mL

## 2019-08-29 MED ORDER — TRIAMCINOLONE ACETONIDE 40 MG/ML IJ SUSP
30.0000 mg | INTRAMUSCULAR | Status: AC | PRN
  Administered 2019-08-29: 12:00:00 30 mg via INTRAMUSCULAR

## 2019-08-29 NOTE — Progress Notes (Signed)
Office Visit Note   Patient: Louis Zimmerman           Date of Birth: 1961/07/16           MRN: [id number] Visit Date: 08/25/2019 Requested by: Rutherford Guys, MD [address] [place],  [place] [id number] PCP: Rutherford Guys, MD  Subjective: Chief Complaint  Patient presents with  . Left Shoulder - Follow-up    02/16/2019 Left Shoulder Arthroscopy Lower Trapezius Tendon Transfer, Biceps Tenodesis  . Right Shoulder - Pain    HPI: Louis Zimmerman is 6 months out left shoulder arthroscopy with lower trapezius tendon transfer and biceps tenodesis.  Doing reasonably well.  He is having some right-sided shoulder pain which is really around his trapezial region.  This is not the affected side.  He has had 1 prior injection in that region which did help.  Denies much in the way of weakness but does report pretty significant pain between the spine and the medial border of the scapula on the right              ROS: All systems reviewed are negative as they relate to the chief complaint within the history of present illness.  Patient denies  fevers or chills.   Assessment & Plan: Visit Diagnoses:  1. Traumatic complete tear of left rotator cuff, initial encounter     Plan: Impression is well-functioning trapezial tendon transfer on the left with good range of motion and strength and functioning.  He is having some muscle spasm on the right trapezial region.  That area is injected under ultrasound today for final time with 0.75 cc of Depo-Medrol and 1 cc of Marcaine.  Come back as needed.  No real surgical intervention for that scapular problem.  Follow-Up Instructions: No follow-ups on file.   Orders:  No orders of the defined types were placed in this encounter.  No orders of the defined types were placed in this encounter.     Procedures: Trigger Point Inj  Date/Time: 08/29/2019 12:15 PM Performed by: Meredith Pel, MD Authorized by: Meredith Pel, MD   Consent Given by:   Patient Indications:  Pain and muscle spasm Total # of Trigger Points:  1 Location: back   Needle Size:  25 G Approach:  Dorsal Medications #1:  3 mL lidocaine 1 %; 30 mg triamcinolone acetonide 40 MG/ML; 2 mL bupivacaine 0.5 %     Clinical Data: No additional findings.  Objective: Vital Signs: Ht 6\' 1"  (1.854 m)   Wt 185 lb (83.9 kg)   BMI 24.41 kg/m   Physical Exam:   Constitutional: Patient appears well-developed HEENT:  Head: Normocephalic Eyes:EOM are normal Neck: Normal range of motion Cardiovascular: Normal rate Pulmonary/chest: Effort normal Neurologic: Patient is alert Skin: Skin is warm Psychiatric: Patient has normal mood and affect    Ortho Exam: Ortho exam demonstrates full active and passive range of motion of the right shoulder.  Does have some tenderness between the medial border of the scapula and this spine.  Motor sensory function hand is intact.  On the left-hand side the patient has good forward flexion abduction above 90 degrees bilaterally.  No masses lymphadenopathy or skin changes noted in that shoulder girdle region.  All incisions are well-healed and his external rotation strength is excellent.  Specialty Comments:  No specialty comments available.  Imaging: No results found.   PMFS History: Patient Active Problem List   Diagnosis Date Noted  . Biceps tendinitis of  left upper extremity   . Rotator cuff arthropathy 02/16/2019  . FHx: colon cancer 12/18/2018  . Vasculitis ([place]) 06/26/2018  . AKI (acute kidney injury) ([place]) 06/26/2018  . 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 Attention deficit disorder (ADD) in adult 11/10/2017  . Moderate episode of recurrent major depressive disorder ([place]) 08/31/2017  . 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 Disturbed concentration 08/31/2017  . Tobacco use disorder 08/31/2017  . History of substance abuse ([place]) 02/23/2017  . Depression 02/02/2013   Past Medical History:  Diagnosis Date  . ADD  (attention deficit disorder)   . Depression     Family History  Problem Relation Age of Onset  . Cancer Mother   . Dementia Mother   . Diabetes Mother   . Heart Problems Mother   . Liver cancer Father   . Colon cancer Father   . Alcohol abuse Maternal Grandfather   . Alcohol abuse Paternal Grandfather   . Colon polyps Sister   . Colon polyps Sister     Past Surgical History:  Procedure Laterality Date  . CHOLECYSTECTOMY    . GALLBLADDER SURGERY  2003  . SHOULDER ARTHROSCOPY Left 02/16/2019   LEFT SHOULDER ARTHROSCOPY LOWER TRAPEZIUS TENDON TRANSFER, BICEPS TENODESIS  . SHOULDER ARTHROSCOPY WITH DEBRIDEMENT AND BICEP TENDON REPAIR Left 02/16/2019   Procedure: LEFT SHOULDER ARTHROSCOPY LOWER TRAPEZIUS TENDON TRANSFER, BICEPS TENODESIS;  Surgeon: Meredith Pel, MD;  Location: [facility];  Service: Orthopedics;  Laterality: Left;  . SKIN BIOPSY  2019   Social History   Occupational History  . Not on file  Tobacco Use  . Smoking status: Current Every Day Smoker    Packs/day: 2.00    Types: Cigarettes  . Smokeless tobacco: Never Used  Substance and Sexual Activity  . Alcohol use: No  . Drug use: No    Frequency: 7.0 times per week    Types: IV    Comment: history of Rx opoid dependency  . Sexual activity: Not on file

## 2019-09-16 ENCOUNTER — Other Ambulatory Visit: Payer: Self-pay

## 2019-09-16 ENCOUNTER — Telehealth: Payer: Self-pay | Admitting: Family Medicine

## 2019-10-26 ENCOUNTER — Encounter: Payer: Self-pay | Admitting: Family Medicine

## 2019-10-26 ENCOUNTER — Other Ambulatory Visit: Payer: Self-pay

## 2019-10-26 ENCOUNTER — Telehealth (INDEPENDENT_AMBULATORY_CARE_PROVIDER_SITE_OTHER): Payer: Self-pay | Admitting: Family Medicine

## 2019-10-26 DIAGNOSIS — M25512 Pain in left shoulder: Secondary | ICD-10-CM

## 2019-10-26 DIAGNOSIS — F331 Major depressive disorder, recurrent, moderate: Secondary | ICD-10-CM

## 2019-10-26 DIAGNOSIS — F988 Other specified behavioral and emotional disorders with onset usually occurring in childhood and adolescence: Secondary | ICD-10-CM

## 2019-10-26 DIAGNOSIS — G8929 Other chronic pain: Secondary | ICD-10-CM

## 2019-10-26 MED ORDER — GABAPENTIN 300 MG PO CAPS: ORAL_CAPSULE | ORAL | 3 refills | Status: DC

## 2019-10-26 MED ORDER — AMPHETAMINE-DEXTROAMPHETAMINE 15 MG PO TABS: 15.0000 mg | ORAL_TABLET | Freq: Every day | ORAL | 0 refills | Status: DC

## 2019-10-26 MED ORDER — SERTRALINE HCL 50 MG PO TABS: 50.0000 mg | ORAL_TABLET | Freq: Every day | ORAL | 3 refills | Status: DC

## 2019-10-26 NOTE — Progress Notes (Signed)
Virtual Visit Note  I connected with patient on 10/26/19 at 239pm by phone per patient's preference and verified that I am speaking with the correct person using two identifiers. Louis Zimmerman is currently located at work and patient is currently with them during visit. The provider, Rutherford Guys, MD is located in their office at time of visit.  I discussed the limitations, risks, security and privacy concerns of performing an evaluation and management service by telephone and the availability of in person appointments. I also discussed with the patient that there may be a patient responsible charge related to this service. The patient expressed understanding and agreed to proceed.   I provided 14 minutes of non-face-to-face time during this encounter.  CC:  Chief Complaint  Patient presents with  . Depression    pt is currently at work, having a hard time hearing each other. Says he will be able to step out at appt time.    HPI ? Last OV 2 months ago Started sertraline for depression  Has been taking sertraline 50mg  every day, tolerating well, doing really on it, feels his mood is very stable, he denies being apathetic/blunted, he feels that he does not need to spend a lot of mental energy to accomplish daily tasks  ADD very well controlled with adderall  Does not take during the weekends pmp reviewed  He is back on gabapentin BID Helps with his mood and pain, right shoulder Will take ibuprofen at bedtime    Allergies  Allergen Reactions  . Hydrocodone Anaphylaxis    Prior to Admission medications   Medication Sig Start Date End Date Taking? Authorizing Provider  amphetamine-dextroamphetamine (ADDERALL) 15 MG tablet Take 1 tablet by mouth daily before breakfast. 08/19/19 09/18/19  Rutherford Guys, MD  amphetamine-dextroamphetamine (ADDERALL) 15 MG tablet Take 1 tablet by mouth daily before breakfast. 09/18/19 10/18/19  Rutherford Guys, MD    amphetamine-dextroamphetamine (ADDERALL) 15 MG tablet Take 1 tablet by mouth daily before breakfast. 10/18/19 11/17/19  Rutherford Guys, MD  gabapentin (NEURONTIN) 300 MG capsule Take 1 capsule (300 mg total) by mouth every morning AND 2 capsules (600 mg total) at bedtime. 08/19/19   Rutherford Guys, MD  sertraline (ZOLOFT) 50 MG tablet Take 1 tablet (50 mg total) by mouth daily. 08/19/19   Rutherford Guys, MD    Past Medical History:  Diagnosis Date  . ADD (attention deficit disorder)   . Depression     Past Surgical History:  Procedure Laterality Date  . CHOLECYSTECTOMY    . GALLBLADDER SURGERY  2003  . SHOULDER ARTHROSCOPY Left 02/16/2019   LEFT SHOULDER ARTHROSCOPY LOWER TRAPEZIUS TENDON TRANSFER, BICEPS TENODESIS  . SHOULDER ARTHROSCOPY WITH DEBRIDEMENT AND BICEP TENDON REPAIR Left 02/16/2019   Procedure: LEFT SHOULDER ARTHROSCOPY LOWER TRAPEZIUS TENDON TRANSFER, BICEPS TENODESIS;  Surgeon: Meredith Pel, MD;  Location: [facility];  Service: Orthopedics;  Laterality: Left;  . SKIN BIOPSY  2019    Social History   Tobacco Use  . Smoking status: Current Every Day Smoker    Packs/day: 2.00    Types: Cigarettes  . Smokeless tobacco: Never Used  Substance Use Topics  . Alcohol use: No    Family History  Problem Relation Age of Onset  . Cancer Mother   . Dementia Mother   . Diabetes Mother   . Heart Problems Mother   . Liver cancer Father   . Colon cancer Father   . Alcohol abuse Maternal Grandfather   .  Alcohol abuse Paternal Grandfather   . Colon polyps Sister   . Colon polyps Sister     ROS Per hpi  Objective  Vitals as reported by the patient: none   ASSESSMENT and PLAN  1. Moderate episode of recurrent major depressive disorder ([name]) Controlled. Doing well on sertraline. Con current regime  2. Attention deficit disorder (ADD) in adult Controlled. Continue current regime. pmp reviewed. Refilled for 3 months  3. Chronic left shoulder pain Stable. Cont  current regime of gabapentin and ibuprofen  Other orders - amphetamine-dextroamphetamine (ADDERALL) 15 MG tablet; Take 1 tablet by mouth daily before breakfast. - amphetamine-dextroamphetamine (ADDERALL) 15 MG tablet; Take 1 tablet by mouth daily before breakfast. - amphetamine-dextroamphetamine (ADDERALL) 15 MG tablet; Take 1 tablet by mouth daily before breakfast. - gabapentin (NEURONTIN) 300 MG capsule; Take 1 capsule (300 mg total) by mouth every morning AND 2 capsules (600 mg total) at bedtime. - sertraline (ZOLOFT) 50 MG tablet; Take 1 tablet (50 mg total) by mouth daily.  FOLLOW-UP: 3 months   The above assessment and management plan was discussed with the patient. The patient verbalized understanding of and has agreed to the management plan. Patient is aware to call the clinic if symptoms persist or worsen. Patient is aware when to return to the clinic for a follow-up visit. Patient educated on when it is appropriate to go to the emergency department.     Rutherford Guys, MD Primary Care at [facility] [place],  [address] Ph.  [phone] Fax [phone]

## 2020-02-10 ENCOUNTER — Ambulatory Visit (INDEPENDENT_AMBULATORY_CARE_PROVIDER_SITE_OTHER): Payer: Self-pay

## 2020-02-10 ENCOUNTER — Other Ambulatory Visit: Payer: Self-pay

## 2020-02-10 ENCOUNTER — Encounter: Payer: Self-pay | Admitting: Family Medicine

## 2020-02-10 ENCOUNTER — Ambulatory Visit (INDEPENDENT_AMBULATORY_CARE_PROVIDER_SITE_OTHER): Payer: Self-pay | Admitting: Family Medicine

## 2020-02-10 VITALS — BP 129/80 | HR 80 | Temp 98.0°F | Ht 73.0 in | Wt 192.8 lb

## 2020-02-10 DIAGNOSIS — M79672 Pain in left foot: Secondary | ICD-10-CM

## 2020-02-10 DIAGNOSIS — R202 Paresthesia of skin: Secondary | ICD-10-CM

## 2020-02-10 DIAGNOSIS — F988 Other specified behavioral and emotional disorders with onset usually occurring in childhood and adolescence: Secondary | ICD-10-CM

## 2020-02-10 DIAGNOSIS — F331 Major depressive disorder, recurrent, moderate: Secondary | ICD-10-CM

## 2020-02-10 DIAGNOSIS — Z79899 Other long term (current) drug therapy: Secondary | ICD-10-CM

## 2020-02-10 MED ORDER — SERTRALINE HCL 100 MG PO TABS: 100.0000 mg | ORAL_TABLET | Freq: Every day | ORAL | 5 refills | Status: DC

## 2020-02-10 MED ORDER — GABAPENTIN 300 MG PO CAPS: ORAL_CAPSULE | ORAL | 3 refills | Status: DC

## 2020-02-10 MED ORDER — AMPHETAMINE-DEXTROAMPHETAMINE 15 MG PO TABS: 15.0000 mg | ORAL_TABLET | Freq: Every day | ORAL | 0 refills | Status: DC

## 2020-02-10 MED ORDER — SERTRALINE HCL 100 MG PO TABS: 1000.0000 mg | ORAL_TABLET | Freq: Every day | ORAL | 5 refills | Status: DC

## 2020-02-10 NOTE — Progress Notes (Signed)
7/15/20219:13 AM  Louis Zimmerman [birth year], 59 y.o., male [id number]  Chief Complaint  Patient presents with  . Depression    HPI:   Patient is a 59 y.o. male with past medical history significant for ADD, depression, chronic L shoulder pain who presents today for routine followup  Last OV march 2021 - no changes  Overall doing ok.  Reports he is not feeling sad anymore but still struggles with motivation and enjoyment Starts work at 530am, goes to bed around [employer] 4 days a week, on days off he has trouble getting things accomplished, but really due to fatigue He is not finding joy in things that he would before, such as refinishing his hardwood floor Chore to make plans with family, friends Having pain, numbness and swelling in his toes, big toes worse x months, despite 900mg  gabapentin at bedtime and ibuprofen No back pain No increase in thirst or urination Reports ADD remains well controlled on adderrall He denies any insomnia, decreased appetite, palpitations, CP pmp reviewed  Depression screen [facility] 2/9 02/10/2020 08/19/2019 02/11/2019  Decreased Interest 1 0 0  Down, Depressed, Hopeless 0 0 0  PHQ - 2 Score 1 0 0  Altered sleeping 0 - -  Tired, decreased energy 1 - -  Change in appetite 0 - -  Feeling bad or failure about yourself  0 - -  Trouble concentrating 0 - -  Moving slowly or fidgety/restless 0 - -  Suicidal thoughts 0 - -  PHQ-9 Score 2 - -  Difficult doing work/chores Somewhat difficult - -    Fall Risk  02/10/2020 08/19/2019 12/18/2018 12/10/2018 05/28/2018  Falls in the past year? 0 0 0 0 No  Number falls in past yr: 0 0 0 0 -  Injury with Fall? 0 0 0 0 -     Allergies  Allergen Reactions  . Hydrocodone Anaphylaxis    Prior to Admission medications   Medication Sig Start Date End Date Taking? Authorizing Provider  amphetamine-dextroamphetamine (ADDERALL) 15 MG tablet Take 1 tablet by mouth daily before breakfast. 10/26/19 02/10/20 Yes  Rutherford Guys, MD  amphetamine-dextroamphetamine (ADDERALL) 15 MG tablet Take 1 tablet by mouth daily before breakfast. 10/26/19 02/10/20 Yes Rutherford Guys, MD  amphetamine-dextroamphetamine (ADDERALL) 15 MG tablet Take 1 tablet by mouth daily before breakfast. 10/26/19 02/10/20 Yes Rutherford Guys, MD  gabapentin (NEURONTIN) 300 MG capsule Take 1 capsule (300 mg total) by mouth every morning AND 2 capsules (600 mg total) at bedtime. 10/26/19  Yes Rutherford Guys, MD  sertraline (ZOLOFT) 50 MG tablet Take 1 tablet (50 mg total) by mouth daily. 10/26/19  Yes Rutherford Guys, MD    Past Medical History:  Diagnosis Date  . ADD (attention deficit disorder)   . Depression     Past Surgical History:  Procedure Laterality Date  . CHOLECYSTECTOMY    . GALLBLADDER SURGERY  2003  . SHOULDER ARTHROSCOPY Left 02/16/2019   LEFT SHOULDER ARTHROSCOPY LOWER TRAPEZIUS TENDON TRANSFER, BICEPS TENODESIS  . SHOULDER ARTHROSCOPY WITH DEBRIDEMENT AND BICEP TENDON REPAIR Left 02/16/2019   Procedure: LEFT SHOULDER ARTHROSCOPY LOWER TRAPEZIUS TENDON TRANSFER, BICEPS TENODESIS;  Surgeon: Meredith Pel, MD;  Location: [facility];  Service: Orthopedics;  Laterality: Left;  . SKIN BIOPSY  2019    Social History   Tobacco Use  . Smoking status: Current Every Day Smoker    Packs/day: 2.00    Types: Cigarettes  . Smokeless tobacco: Never Used  Substance Use  Topics  . Alcohol use: No    Family History  Problem Relation Age of Onset  . Cancer Mother   . Dementia Mother   . Diabetes Mother   . Heart Problems Mother   . Liver cancer Father   . Colon cancer Father   . Alcohol abuse Maternal Grandfather   . Alcohol abuse Paternal Grandfather   . Colon polyps Sister   . Colon polyps Sister     ROS Per hpi  OBJECTIVE:  Today's Vitals   02/10/20 0902  BP: 129/80  Pulse: 80  Temp: 98 F (36.7 C)  SpO2: 96%  Weight: 192 lb 12.8 oz (87.5 kg)  Height: 6\' 1"  (1.854 m)   Body mass index is 25.44  kg/m.    Physical Exam Vitals and nursing note reviewed.  Constitutional:      Appearance: He is well-developed.  HENT:     Head: Normocephalic and atraumatic.  Eyes:     Conjunctiva/sclera: Conjunctivae normal.     Pupils: Pupils are equal, round, and reactive to light.  Cardiovascular:     Rate and Rhythm: Normal rate and regular rhythm.     Pulses:          Dorsalis pedis pulses are 1+ on the right side and 1+ on the left side.       Posterior tibial pulses are 1+ on the right side and 1+ on the left side.     Heart sounds: No murmur heard.  No friction rub. No gallop.   Pulmonary:     Effort: Pulmonary effort is normal.     Breath sounds: Normal breath sounds. No wheezing or rales.  Musculoskeletal:     Cervical back: Neck supple.     Right lower leg: No edema.     Left lower leg: No edema.     Right foot: Normal range of motion. Prominent metatarsal heads present.     Left foot: Normal range of motion. Prominent metatarsal heads present.  Feet:     Right foot:     Skin integrity: Callus and dry skin present. No erythema or warmth.     Left foot:     Skin integrity: Callus and dry skin present. No erythema or warmth.  Skin:    General: Skin is warm and dry.  Neurological:     Mental Status: He is alert and oriented to person, place, and time.     No results found for this or any previous visit (from the past 24 hour(s)).  DG Foot Complete Left  Result Date: 02/10/2020 CLINICAL DATA:  Foot pain and swelling. EXAM: LEFT FOOT - COMPLETE 3+ VIEW COMPARISON:  None. FINDINGS: Moderate to advanced degenerative changes at the first MTP joint with joint space narrowing, spurring, bony eburnation and subchondral cystic change. No obvious erosions. Bipartite medial sesamoid of the great toe is noted. The interphalangeal joints and other MTP joints are maintained. The midfoot joint spaces are maintained. No acute bony abnormality. IMPRESSION: 1. 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 2. No acute bony findings. Electronically Signed   By: Marijo Sanes M.D.   On: 02/10/2020 09:51    Diabetic Foot Form - Detailed   Diabetic Foot Exam - detailed Pulse Foot Exam completed.: Yes  Sensory Foot Exam Completed.: Yes Semmes-Weinstein Monofilament Test   Comments: Pt has some slight decrease in sensation, some small spots of scalling and dry skin otherwise normal exam.      ASSESSMENT and PLAN  1. Attention deficit disorder (ADD) in adult 2. Medication management Controlled. Continue current regime. pmp reviewed. UDS done today - ToxASSURE Select 13 (MW), Urine  3. Moderate episode of recurrent major depressive disorder ([place]) Not controlled. Increasing sertraline. Discussed behavior activation  4. Left foot pain - DG Foot Complete Left - OA, cont ibuprofen prn, reviewed r/se/b  5. Paresthesia of foot, bilateral Cont gabapentin 900mg  at bedtime. Labs pending to r/o organic causes - Comprehensive metabolic panel - Hemoglobin A1c - TSH - Vitamin B12  Other orders - amphetamine-dextroamphetamine (ADDERALL) 15 MG tablet; Take 1 tablet by mouth daily before breakfast. - amphetamine-dextroamphetamine (ADDERALL) 15 MG tablet; Take 1 tablet by mouth daily before breakfast. - amphetamine-dextroamphetamine (ADDERALL) 15 MG tablet; Take 1 tablet by mouth daily before breakfast. - gabapentin (NEURONTIN) 300 MG capsule; Take 1 capsule (300 mg total) by mouth every morning AND 2 capsules (600 mg total) at bedtime. - sertraline (ZOLOFT) 100 MG tablet; Take 1 tablet (100 mg total) by mouth daily.  Return in about 4 weeks (around 03/09/2020).    Rutherford Guys, MD Primary Care at [place], [place] [address] Ph.  [phone] Fax [phone]

## 2020-02-10 NOTE — Patient Instructions (Signed)
° ° ° °  If you have lab work done today you will be contacted with your lab results within the next 2 weeks.  If you have not heard from us then please contact us. The fastest way to get your results is to register for My Chart. ° ° °IF you received an x-ray today, you will receive an invoice from [facility]. Please contact [facility] at [phone] with questions or concerns regarding your invoice.  ° °IF you received labwork today, you will receive an invoice from [facility]. Please contact [facility] at [phone] with questions or concerns regarding your invoice.  ° °Our billing staff will not be able to assist you with questions regarding bills from these companies. ° °You will be contacted with the lab results as soon as they are available. The fastest way to get your results is to activate your My Chart account. Instructions are located on the last page of this paperwork. If you have not heard from us regarding the results in 2 weeks, please contact this office. °  ° ° ° °

## 2020-02-11 LAB — COMPREHENSIVE METABOLIC PANEL
ALT: 26 IU/L (ref 0–44)
AST: 19 IU/L (ref 0–40)
Albumin/Globulin Ratio: 1.9 (ref 1.2–2.2)
Albumin: 4.5 g/dL (ref 3.8–4.9)
Alkaline Phosphatase: 124 IU/L — ABNORMAL HIGH (ref 48–121)
BUN/Creatinine Ratio: 8 — ABNORMAL LOW (ref 9–20)
BUN: 8 mg/dL (ref 6–24)
Bilirubin Total: 0.2 mg/dL (ref 0.0–1.2)
CO2: 17 mmol/L — ABNORMAL LOW (ref 20–29)
Calcium: 9.7 mg/dL (ref 8.7–10.2)
Chloride: 104 mmol/L (ref 96–106)
Creatinine, Ser: 1 mg/dL (ref 0.76–1.27)
GFR calc Af Amer: 95 mL/min/{1.73_m2} (ref >59)
GFR calc non Af Amer: 83 mL/min/{1.73_m2} (ref >59)
Globulin, Total: 2.4 g/dL (ref 1.5–4.5)
Glucose: 90 mg/dL (ref 65–99)
Potassium: 4.4 mmol/L (ref 3.5–5.2)
Sodium: 138 mmol/L (ref 134–144)
Total Protein: 6.9 g/dL (ref 6.0–8.5)

## 2020-02-11 LAB — TSH: TSH: 4.34 u[IU]/mL (ref 0.450–4.500)

## 2020-02-11 LAB — VITAMIN B12: Vitamin B-12: 1701 pg/mL — ABNORMAL HIGH (ref 232–1245)

## 2020-02-11 LAB — HEMOGLOBIN A1C
Est. average glucose Bld gHb Est-mCnc: 120 mg/dL
Hgb A1c MFr Bld: 5.8 % — ABNORMAL HIGH (ref 4.8–5.6)

## 2020-02-14 LAB — TOXASSURE SELECT 13 (MW), URINE

## 2020-03-09 ENCOUNTER — Ambulatory Visit: Payer: Self-pay | Admitting: Family Medicine

## 2020-03-10 ENCOUNTER — Other Ambulatory Visit: Payer: Self-pay

## 2020-03-10 ENCOUNTER — Telehealth (INDEPENDENT_AMBULATORY_CARE_PROVIDER_SITE_OTHER): Payer: Self-pay | Admitting: Family Medicine

## 2020-03-10 ENCOUNTER — Encounter: Payer: Self-pay | Admitting: Family Medicine

## 2020-03-10 VITALS — Ht 72.0 in | Wt 190.0 lb

## 2020-03-10 DIAGNOSIS — Z20822 Contact with and (suspected) exposure to covid-19: Secondary | ICD-10-CM

## 2020-03-10 DIAGNOSIS — F331 Major depressive disorder, recurrent, moderate: Secondary | ICD-10-CM

## 2020-03-10 DIAGNOSIS — R7303 Prediabetes: Secondary | ICD-10-CM

## 2020-03-10 MED ORDER — CITALOPRAM HYDROBROMIDE 40 MG PO TABS
40.0000 mg | ORAL_TABLET | Freq: Every day | ORAL | 3 refills | Status: DC
Start: 2020-03-10 — End: 2020-04-28

## 2020-03-10 NOTE — Progress Notes (Signed)
Virtual Visit Note  I connected with patient on 03/10/20 at 530pm by phone and verified that I am speaking with the correct person using two identifiers. Louis Zimmerman is currently located at home and patient is currently with them during visit. The provider, Rutherford Guys, MD is located in their office at time of visit.  I discussed the limitations, risks, security and privacy concerns of performing an evaluation and management service by telephone and the availability of in person appointments. I also discussed with the patient that there may be a patient responsible charge related to this service. The patient expressed understanding and agreed to proceed.   I provided 30 minutes of non-face-to-face time during this encounter.  Chief Complaint  Patient presents with  . Depression    f/u with medication changes and refill   . Blood work follow up    HPI ? Last OV July 2021 - increased sertraline to 100mg  daily due to anhedonia Very sedating, waking up with "hangover effect" Not having any improvement in anhedonia He suffers from both depression and anxiety Past meds tried: wellbutrin 300mg  (2018) - no effect Duloxetine (2014) - started after quitting drugs prozac 40mg  (2020) - stopped working remeron (2014) - started after quitting drugs abilify (2014) - started after quitting drugs seroquel (2014) - started after quitting drugs lamictal (2014) - started after quitting drugs  Labs from July reviewed  He thinks that that he might have had covid towards beginning of covid pandemia He has not had covid vaccine  Depression screen [facility] 2/9 03/10/2020 02/10/2020 08/19/2019 02/11/2019 12/18/2018  Decreased Interest 0 1 0 0 0  Down, Depressed, Hopeless 1 0 0 0 0  PHQ - 2 Score 1 1 0 0 0  Altered sleeping 0 0 - - -  Tired, decreased energy 0 1 - - -  Change in appetite 0 0 - - -  Feeling bad or failure about yourself  0 0 - - -  Trouble concentrating 0 0 - - -  Moving slowly  or fidgety/restless 0 0 - - -  Suicidal thoughts 0 0 - - -  PHQ-9 Score 1 2 - - -  Difficult doing work/chores Somewhat difficult Somewhat difficult - - -    Allergies  Allergen Reactions  . Hydrocodone Anaphylaxis    Prior to Admission medications   Medication Sig Start Date End Date Taking? Authorizing Provider  amphetamine-dextroamphetamine (ADDERALL) 15 MG tablet Take 1 tablet by mouth daily before breakfast. 02/10/20 03/11/20  Rutherford Guys, MD  amphetamine-dextroamphetamine (ADDERALL) 15 MG tablet Take 1 tablet by mouth daily before breakfast. 02/10/20 03/11/20  Rutherford Guys, MD  amphetamine-dextroamphetamine (ADDERALL) 15 MG tablet Take 1 tablet by mouth daily before breakfast. 02/10/20 03/11/20  Rutherford Guys, MD  gabapentin (NEURONTIN) 300 MG capsule Take 1 capsule (300 mg total) by mouth every morning AND 2 capsules (600 mg total) at bedtime. 02/10/20   Rutherford Guys, MD  sertraline (ZOLOFT) 100 MG tablet Take 1 tablet (100 mg total) by mouth daily. 02/10/20   Rutherford Guys, MD    Past Medical History:  Diagnosis Date  . ADD (attention deficit disorder)   . Depression     Past Surgical History:  Procedure Laterality Date  . CHOLECYSTECTOMY    . GALLBLADDER SURGERY  2003  . SHOULDER ARTHROSCOPY Left 02/16/2019   LEFT SHOULDER ARTHROSCOPY LOWER TRAPEZIUS TENDON TRANSFER, BICEPS TENODESIS  . SHOULDER ARTHROSCOPY WITH DEBRIDEMENT AND BICEP TENDON REPAIR Left 02/16/2019  Procedure: LEFT SHOULDER ARTHROSCOPY LOWER TRAPEZIUS TENDON TRANSFER, BICEPS TENODESIS;  Surgeon: Meredith Pel, MD;  Location: [facility];  Service: Orthopedics;  Laterality: Left;  . SKIN BIOPSY  2019    Social History   Tobacco Use  . Smoking status: Current Every Day Smoker    Packs/day: 2.00    Types: Cigarettes  . Smokeless tobacco: Never Used  Substance Use Topics  . Alcohol use: No    Family History  Problem Relation Age of Onset  . Cancer Mother   . Dementia Mother   .  Diabetes Mother   . Heart Problems Mother   . Liver cancer Father   . Colon cancer Father   . Alcohol abuse Maternal Grandfather   . Alcohol abuse Paternal Grandfather   . Colon polyps Sister   . Colon polyps Sister     ROS Per hpi  Objective  Vitals as reported by the patient: none   ASSESSMENT and PLAN  1. Moderate episode of recurrent major depressive disorder ([name]) Not tolerating sertraline, change to celexa. reviewed r/se/b, discussed cutting back on caffeine and behavior activation  2. Prediabetes Discussed LFM  3. Encounter for laboratory testing for COVID-19 virus - SAR CoV2 Serology (COVID 19)AB(IGG)IA  Other orders - citalopram (CELEXA) 40 MG tablet; Take 1 tablet (40 mg total) by mouth daily.  FOLLOW-UP: 4 weeks   The above assessment and management plan was discussed with the patient. The patient verbalized understanding of and has agreed to the management plan. Patient is aware to call the clinic if symptoms persist or worsen. Patient is aware when to return to the clinic for a follow-up visit. Patient educated on when it is appropriate to go to the emergency department.     Rutherford Guys, MD Primary Care at [place], [place] [address] Ph.  [phone] Fax [phone]

## 2020-03-17 ENCOUNTER — Other Ambulatory Visit: Payer: Self-pay

## 2020-03-17 ENCOUNTER — Ambulatory Visit: Payer: Self-pay

## 2020-03-18 LAB — SAR COV2 SEROLOGY (COVID19)AB(IGG),IA: DiaSorin SARS-CoV-2 Ab, IgG: NEGATIVE

## 2020-03-23 ENCOUNTER — Ambulatory Visit: Payer: Self-pay | Admitting: Family Medicine

## 2020-04-17 ENCOUNTER — Other Ambulatory Visit: Payer: Self-pay

## 2020-04-17 ENCOUNTER — Telehealth: Payer: Self-pay | Admitting: Family Medicine

## 2020-04-18 ENCOUNTER — Encounter: Payer: Self-pay | Admitting: Family Medicine

## 2020-04-18 ENCOUNTER — Telehealth: Payer: Self-pay | Admitting: Family Medicine

## 2020-04-18 NOTE — Telephone Encounter (Signed)
Called PCP  at 5:15 on 9/20 for his appointment, he couldn't reach anyone. 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 He feels he was there for his appointment and we missed it with him. He does not want to pay the fee.

## 2020-04-18 NOTE — Telephone Encounter (Signed)
Fee will be waived

## 2020-04-18 NOTE — Telephone Encounter (Signed)
Pt missed his appointment a virtual at 5:20 on 04/17/20. He is stating he

## 2020-04-28 ENCOUNTER — Encounter: Payer: Self-pay | Admitting: Family Medicine

## 2020-04-28 ENCOUNTER — Other Ambulatory Visit: Payer: Self-pay

## 2020-04-28 ENCOUNTER — Ambulatory Visit (INDEPENDENT_AMBULATORY_CARE_PROVIDER_SITE_OTHER): Payer: Self-pay | Admitting: Family Medicine

## 2020-04-28 VITALS — BP 103/68 | HR 90 | Temp 98.2°F | Resp 16 | Ht 72.0 in | Wt 192.4 lb

## 2020-04-28 DIAGNOSIS — F331 Major depressive disorder, recurrent, moderate: Secondary | ICD-10-CM

## 2020-04-28 DIAGNOSIS — M79672 Pain in left foot: Secondary | ICD-10-CM

## 2020-04-28 MED ORDER — MELOXICAM 15 MG PO TABS: 15.0000 mg | ORAL_TABLET | Freq: Every day | ORAL | 3 refills | Status: DC

## 2020-04-28 MED ORDER — GABAPENTIN 300 MG PO CAPS
ORAL_CAPSULE | ORAL | 3 refills | Status: DC
Start: 2020-04-28 — End: 2020-09-29

## 2020-04-28 MED ORDER — CITALOPRAM HYDROBROMIDE 40 MG PO TABS
40.0000 mg | ORAL_TABLET | Freq: Every day | ORAL | 5 refills | Status: DC
Start: 2020-04-28 — End: 2020-11-24

## 2020-04-28 NOTE — Progress Notes (Signed)
10/1/20213:53 PM  Louis Zimmerman 1960/10/19, 59 y.o., male [id number]  Chief Complaint  Patient presents with  . Depression    pt reports doing pretty well, no concerns or side effects at this time   . Arthritis    pt reports arthritis pain in feet intensifying especially walking at work, concerned hes taking too uch tylenol.     HPI:   Patient is a 59 y.o. male with past medical history significant for ADD, depression, chronic L shoulder pain who presents today for routine followup  Last OV aug 2021 - changed sertraline to celexa due to intolerance of higher dose and not well controlled  Patient reports that depression is sign better on celexa, anhedonia resolved He had no issues with transition Cont to have bilateral feet pain Left foot xray July 2021 -- mod to advance 1st mtp joint oa Doing APAP 650mg  2 tabs TID Averaging walking 3 miles a day at work on concrete  Depression screen [facility] 2/9 04/28/2020 03/10/2020 02/10/2020  Decreased Interest 0 0 1  Down, Depressed, Hopeless 1 1 0  PHQ - 2 Score 1 1 1   Altered sleeping 0 0 0  Tired, decreased energy 0 0 1  Change in appetite 0 0 0  Feeling bad or failure about yourself  0 0 0  Trouble concentrating 0 0 0  Moving slowly or fidgety/restless 0 0 0  Suicidal thoughts 0 0 0  PHQ-9 Score 1 1 2   Difficult doing work/chores - Somewhat difficult Somewhat difficult  Some recent data might be hidden    Fall Risk  04/28/2020 03/10/2020 02/10/2020 08/19/2019 12/18/2018  Falls in the past year? 0 0 0 0 0  Number falls in past yr: 0 0 0 0 0  Injury with Fall? 0 0 0 0 0  Risk for fall due to : No Fall Risks - - - -  Follow up Falls evaluation completed Falls evaluation completed - - -     Allergies  Allergen Reactions  . Hydrocodone Anaphylaxis  . Sertraline     sedating    Prior to Admission medications   Medication Sig Start Date End Date Taking? Authorizing Provider  citalopram (CELEXA) 40 MG tablet Take 1 tablet (40 mg  total) by mouth daily. 03/10/20  Yes Rutherford Guys, MD  gabapentin (NEURONTIN) 300 MG capsule Take 1 capsule (300 mg total) by mouth every morning AND 2 capsules (600 mg total) at bedtime. 02/10/20  Yes Rutherford Guys, MD  amphetamine-dextroamphetamine (ADDERALL) 15 MG tablet Take 1 tablet by mouth daily before breakfast. 02/10/20 03/11/20  Rutherford Guys, MD  amphetamine-dextroamphetamine (ADDERALL) 15 MG tablet Take 1 tablet by mouth daily before breakfast. 02/10/20 03/11/20  Rutherford Guys, MD  amphetamine-dextroamphetamine (ADDERALL) 15 MG tablet Take 1 tablet by mouth daily before breakfast. 02/10/20 03/11/20  Rutherford Guys, MD    Past Medical History:  Diagnosis Date  . ADD (attention deficit disorder)   . Depression     Past Surgical History:  Procedure Laterality Date  . CHOLECYSTECTOMY    . GALLBLADDER SURGERY  2003  . SHOULDER ARTHROSCOPY Left 02/16/2019   LEFT SHOULDER ARTHROSCOPY LOWER TRAPEZIUS TENDON TRANSFER, BICEPS TENODESIS  . SHOULDER ARTHROSCOPY WITH DEBRIDEMENT AND BICEP TENDON REPAIR Left 02/16/2019   Procedure: LEFT SHOULDER ARTHROSCOPY LOWER TRAPEZIUS TENDON TRANSFER, BICEPS TENODESIS;  Surgeon: Meredith Pel, MD;  Location: [place];  Service: Orthopedics;  Laterality: Left;  . SKIN BIOPSY  2019    Social History  Tobacco Use  . Smoking status: Current Every Day Smoker    Packs/day: 2.00    Types: Cigarettes  . Smokeless tobacco: Never Used  Substance Use Topics  . Alcohol use: No    Family History  Problem Relation Age of Onset  . Cancer Mother   . Dementia Mother   . Diabetes Mother   . Heart Problems Mother   . Liver cancer Father   . Colon cancer Father   . Alcohol abuse Maternal Grandfather   . Alcohol abuse Paternal Grandfather   . Colon polyps Sister   . Colon polyps Sister     ROS Per hpi  OBJECTIVE:  Today's Vitals   04/28/20 1532  BP: 103/68  Pulse: 90  Resp: 16  Temp: 98.2 F (36.8 C)  TempSrc: Temporal  SpO2: 98%   Weight: 192 lb 6.4 oz (87.3 kg)  Height: 6' (1.829 m)   Body mass index is 26.09 kg/m.   Physical Exam Vitals and nursing note reviewed.  Constitutional:      Appearance: He is well-developed.  HENT:     Head: Normocephalic and atraumatic.  Eyes:     Conjunctiva/sclera: Conjunctivae normal.     Pupils: Pupils are equal, round, and reactive to light.  Pulmonary:     Effort: Pulmonary effort is normal.  Musculoskeletal:     Cervical back: Neck supple.  Skin:    General: Skin is warm and dry.  Neurological:     Mental Status: He is alert and oriented to person, place, and time.       No results found for this or any previous visit (from the past 24 hour(s)).  No results found.   ASSESSMENT and PLAN  1. Left foot pain Patient with know OA. Adding meloxicam, reviewed r/se/b. Discussed APAP dosing. Check uric acid to r/o gout contributing pain.  - Uric Acid  2. Moderate episode of recurrent major depressive disorder ([name]) Controlled. Continue current regime.   Other orders - meloxicam (MOBIC) 15 MG tablet; Take 1 tablet (15 mg total) by mouth daily. - citalopram (CELEXA) 40 MG tablet; Take 1 tablet (40 mg total) by mouth daily. - gabapentin (NEURONTIN) 300 MG capsule; Take 1 capsule (300 mg total) by mouth every morning AND 2 capsules (600 mg total) at bedtime.  Return for 3-4 months TOC.    Rutherford Guys, MD [facility] [place], [place] [address] Ph.  [phone] Fax [phone]

## 2020-04-28 NOTE — Patient Instructions (Signed)
° ° ° °  If you have lab work done today you will be contacted with your lab results within the next 2 weeks.  If you have not heard from us then please contact us. The fastest way to get your results is to register for My Chart. ° ° °IF you received an x-ray today, you will receive an invoice from [facility]. Please contact [facility] at [phone] with questions or concerns regarding your invoice.  ° °IF you received labwork today, you will receive an invoice from [facility]. Please contact [facility] at [phone] with questions or concerns regarding your invoice.  ° °Our billing staff will not be able to assist you with questions regarding bills from these companies. ° °You will be contacted with the lab results as soon as they are available. The fastest way to get your results is to activate your My Chart account. Instructions are located on the last page of this paperwork. If you have not heard from us regarding the results in 2 weeks, please contact this office. °  ° ° ° °

## 2020-04-29 LAB — URIC ACID: Uric Acid: 6.1 mg/dL (ref 3.8–8.4)

## 2020-05-24 ENCOUNTER — Telehealth: Payer: Self-pay | Admitting: Family Medicine

## 2020-05-24 NOTE — Telephone Encounter (Signed)
What is the name of the medication?amphetamine-dextroamphetamine (ADDERALL) 15 MG tablet [[id number]]  And gabapentin (NEURONTIN) 300 MG capsule [[id number]] he would like 1 refill on.   Have you contacted your pharmacy to request a refill?he is under the impression Pamella Pert was going to write him this script for month 10,11, 12 and 1. He has an upcoming TOC on 08/18/20.  Which pharmacy would you like this sent to? Pharmacy  [facility] [address] [place]  [address] [place] [address]  Phone:  [phone] Fax:  [phone]       Patient notified that their request is being sent to the clinical staff for review and that they should receive a call once it is complete. If they do not receive a call within 72 hours they can check with their pharmacy or our office.

## 2020-05-25 MED ORDER — AMPHETAMINE-DEXTROAMPHETAMINE 15 MG PO TABS: 15.0000 mg | ORAL_TABLET | Freq: Every day | ORAL | 0 refills | Status: DC

## 2020-05-25 NOTE — Telephone Encounter (Signed)
Patient is requesting a refill of the following medications: Requested Prescriptions    No prescriptions requested or ordered in this encounter  Adderrall 15   Date of patient request: 05/25/2020 Last office visit: 04/28/2020 Date of last refill: 02/10/2020 (fill 60 days after Rx) Last refill amount: 30 tab  Follow up time period per chart: 3-4 months    Pt has TOC with you 08/26/2019 [place] pt

## 2020-05-25 NOTE — Telephone Encounter (Signed)
Prior patient of Dr. Pamella Pert, medications discussed in July. Controlled substance database reviewed with last prescription filled on September 24. No concerns. Consistent UDS on July 15. Meds refilled, recheck in January with me as scheduled.

## 2020-06-16 ENCOUNTER — Other Ambulatory Visit: Payer: Self-pay

## 2020-06-16 ENCOUNTER — Ambulatory Visit: Payer: Self-pay

## 2020-06-16 ENCOUNTER — Ambulatory Visit (INDEPENDENT_AMBULATORY_CARE_PROVIDER_SITE_OTHER): Payer: BC Managed Care – PPO | Admitting: Surgical

## 2020-06-16 DIAGNOSIS — R200 Anesthesia of skin: Secondary | ICD-10-CM | POA: Diagnosis not present

## 2020-06-16 DIAGNOSIS — M898X1 Other specified disorders of bone, shoulder: Secondary | ICD-10-CM | POA: Diagnosis not present

## 2020-06-16 DIAGNOSIS — M79601 Pain in right arm: Secondary | ICD-10-CM

## 2020-06-17 ENCOUNTER — Encounter: Payer: Self-pay | Admitting: Surgical

## 2020-06-17 DIAGNOSIS — M898X1 Other specified disorders of bone, shoulder: Secondary | ICD-10-CM | POA: Diagnosis not present

## 2020-06-17 DIAGNOSIS — M79601 Pain in right arm: Secondary | ICD-10-CM | POA: Diagnosis not present

## 2020-06-17 MED ORDER — LIDOCAINE HCL 1 % IJ SOLN
3.0000 mL | INTRAMUSCULAR | Status: AC | PRN
  Administered 2020-06-17: 3 mL

## 2020-06-17 MED ORDER — METHYLPREDNISOLONE ACETATE 40 MG/ML IJ SUSP
40.0000 mg | INTRAMUSCULAR | Status: AC | PRN
  Administered 2020-06-17: 40 mg via INTRAMUSCULAR

## 2020-06-17 MED ORDER — BUPIVACAINE HCL 0.5 % IJ SOLN
2.0000 mL | INTRAMUSCULAR | Status: AC | PRN
  Administered 2020-06-17: 2 mL

## 2020-06-17 NOTE — Progress Notes (Signed)
Office Visit Note   Patient: Louis Zimmerman           Date of Birth: Aug 04, 1960           MRN: [id number] Visit Date: 06/16/2020 Requested by: No referring provider defined for this encounter. PCP: Rutherford Guys, MD (Inactive)  Subjective: Chief Complaint  Patient presents with  . right arm pain    HPI: Louis Zimmerman is a 59 y.o. male who presents to the office complaining of right shoulder pain.  Patient complains of chronic right shoulder pain in the posterior shoulder blade/trapezius region.  He has had pain here for years that was exacerbated by his left shoulder surgery last year.  He notes trapezial pain and medial shoulder blade pain.  He also has numbness and tingling in all 10 of his fingers "almost all the time".  He denies any weakness in his right or left shoulder.  He is doing well following his left shoulder lower trapezius tendon transfer in 2020.  He had 100% relief with the trigger point injection to the shoulder blade/trapezial region on 08/25/2019 by Dr. [name].  Does not feel it is coming from his neck..                ROS: All systems reviewed are negative as they relate to the chief complaint within the history of present illness.  Patient denies fevers or chills.  Assessment & Plan: Visit Diagnoses:  1. Right arm pain   2. Hand numbness     Plan: Patient is a 59 year old male presents complaint of right shoulder pain.  Has a history of pain at a certain trigger point along the medial scapula in the trapezius.  He had 100% relief with trigger point injection in January 2021 but recently pain is returned.  Radiographs of the right shoulder are unremarkable with radiographs of the cervical spine showing mild progression of moderate degenerative changes from the last radiographs 1.5 years ago.  Despite the radiographic findings, patient does not feel this is coming from his neck.  He does not have any tenderness over the axial cervical spine or any pain with  cervical spine range of motion.  He does have tenderness over specific point in the trapezius.  3 cc of lidocaine was administered at this trigger point and after 5 to 10 minutes, he states his pain had resolved.  A cortisone injection consisting of 0.75 cc of Depo and 1 cc of Marcaine was administered in the same location.  Patient tolerated procedure well.  Additionally, plan to order nerve conduction study of bilateral upper extremities with the hand numbness and tingling that he has been experiencing for several months.  Follow-up after nerve study.  Follow-Up Instructions: No follow-ups on file.   Orders:  Orders Placed This Encounter  Procedures  . XR Cervical Spine 2 or 3 views  . XR Shoulder Right  . Ambulatory referral to Physical Medicine Rehab   No orders of the defined types were placed in this encounter.     Procedures: Trigger Point Inj  Date/Time: 06/17/2020 11:29 AM Performed by: Donella Stade, PA-C Authorized by: Donella Stade, PA-C   Consent Given by:  Patient Indications:  Pain Total # of Trigger Points:  1 Location: back   Needle Size:  18 G Medications #1:  3 mL lidocaine 1 %; 40 mg methylPREDNISolone acetate 40 MG/ML; 2 mL bupivacaine 0.5 % Comments: 0.75cc Depo with 1cc bupivacaine for therapeutic injection.  3  cc lidocaine for pre-injection anesthesia     Clinical Data: No additional findings.  Objective: Vital Signs: There were no vitals taken for this visit.  Physical Exam:  Constitutional: Patient appears well-developed HEENT:  Head: Normocephalic Eyes:EOM are normal Neck: Normal range of motion Cardiovascular: Normal rate Pulmonary/chest: Effort normal Neurologic: Patient is alert Skin: Skin is warm Psychiatric: Patient has normal mood and affect  Ortho Exam: Ortho exam demonstrates right shoulder with excellent range of motion and 5/5 motor strength of the supraspinatus, infraspinatus, subscapularis.  No crepitus felt on exam.   No tenderness over the [facility] joint or the bicipital groove.  Decreased sensation over the dorsal and plantar aspect of both hands for all 5 fingers.  Sensation intact through all dermatomes of the upper arm and forearm.  Negative Tinel's/Phalen's test bilaterally.  Positive Froment sign of the right hand.  Increased numbness and tingling sensation in the right fourth and fifth fingers with tapping over the area of the ulnar nerve.  No subluxing ulnar nerve noted on exam bilaterally.  Incisions well-healed from prior left shoulder surgery.  Excellent right rotational strength of the left shoulder with 5/5 motor strength of abduction and external rotation.  No tenderness over the axial cervical spine.  No pain with cervical spine range of motion.  Negative Spurling sign.  Specialty Comments:  No specialty comments available.  Imaging: No results found.   PMFS History: Patient Active Problem List   Diagnosis Date Noted  . Biceps tendinitis of left upper extremity   . Rotator cuff arthropathy 02/16/2019  . FHx: colon cancer 12/18/2018  . Vasculitis ([place]) 06/26/2018  . AKI (acute kidney injury) ([place]) 06/26/2018  . 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 Attention deficit disorder (ADD) in adult 11/10/2017  . Moderate episode of recurrent major depressive disorder ([place]) 08/31/2017  . 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 Disturbed concentration 08/31/2017  . Tobacco use disorder 08/31/2017  . History of substance abuse ([place]) 02/23/2017  . Depression 02/02/2013   Past Medical History:  Diagnosis Date  . ADD (attention deficit disorder)   . Depression     Family History  Problem Relation Age of Onset  . Cancer Mother   . Dementia Mother   . Diabetes Mother   . Heart Problems Mother   . Liver cancer Father   . Colon cancer Father   . Alcohol abuse Maternal Grandfather   . Alcohol abuse Paternal Grandfather   . Colon polyps Sister   . Colon polyps Sister     Past Surgical History:    Procedure Laterality Date  . CHOLECYSTECTOMY    . GALLBLADDER SURGERY  2003  . SHOULDER ARTHROSCOPY Left 02/16/2019   LEFT SHOULDER ARTHROSCOPY LOWER TRAPEZIUS TENDON TRANSFER, BICEPS TENODESIS  . SHOULDER ARTHROSCOPY WITH DEBRIDEMENT AND BICEP TENDON REPAIR Left 02/16/2019   Procedure: LEFT SHOULDER ARTHROSCOPY LOWER TRAPEZIUS TENDON TRANSFER, BICEPS TENODESIS;  Surgeon: Meredith Pel, MD;  Location: [place];  Service: Orthopedics;  Laterality: Left;  . SKIN BIOPSY  2019   Social History   Occupational History  . Not on file  Tobacco Use  . Smoking status: Current Every Day Smoker    Packs/day: 2.00    Types: Cigarettes  . Smokeless tobacco: Never Used  Vaping Use  . Vaping Use: Former  Substance and Sexual Activity  . Alcohol use: No  . Drug use: No    Frequency: 7.0 times per week    Types: IV    Comment: history of Rx opoid  dependency  . Sexual activity: Not on file

## 2020-07-26 ENCOUNTER — Ambulatory Visit: Payer: Self-pay

## 2020-07-26 ENCOUNTER — Telehealth: Payer: Self-pay

## 2020-07-26 ENCOUNTER — Ambulatory Visit: Payer: BC Managed Care – PPO | Admitting: Surgical

## 2020-07-26 DIAGNOSIS — M25562 Pain in left knee: Secondary | ICD-10-CM

## 2020-07-26 DIAGNOSIS — M19072 Primary osteoarthritis, left ankle and foot: Secondary | ICD-10-CM | POA: Diagnosis not present

## 2020-07-26 DIAGNOSIS — R200 Anesthesia of skin: Secondary | ICD-10-CM | POA: Diagnosis not present

## 2020-07-26 DIAGNOSIS — M25561 Pain in right knee: Secondary | ICD-10-CM

## 2020-07-26 DIAGNOSIS — R202 Paresthesia of skin: Secondary | ICD-10-CM

## 2020-07-26 MED ORDER — MELOXICAM 15 MG PO TABS: 15.0000 mg | ORAL_TABLET | Freq: Every day | ORAL | 0 refills | Status: DC

## 2020-07-26 NOTE — Telephone Encounter (Signed)
Scheduled patient

## 2020-07-26 NOTE — Telephone Encounter (Signed)
Patient is being seen today he is still waiting to be scheduled for his nerve test with Dr.Newton patient stated he doesn't answer phone calls that he doesn't know he stated the best time to call would be after 3:30. [phone]

## 2020-07-29 ENCOUNTER — Encounter: Payer: Self-pay | Admitting: Surgical

## 2020-07-29 NOTE — Progress Notes (Signed)
Office Visit Note   Patient: Louis Zimmerman           Date of Birth: 01/03/1961           MRN: [id number] Visit Date: 07/26/2020 Requested by: No referring provider defined for this encounter. PCP: Jacelyn Pi, Lilia Argue, MD  Subjective: Chief Complaint  Patient presents with  . Right Knee - Pain  . Left Knee - Pain    HPI: Louis Zimmerman is a 60 y.o. male who presents to the office complaining of bilateral knee pain.  Patient denies any history of injury to his knees.  He notes intermittent bilateral knee pain that equally affects his right and left knees.  Mostly feels pain under his kneecap.  It is worse with ascending and especially descending stairs when it comes on.  He also notes pain with kneeling and using a ladder.  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  Pain never wakes him up at night.  Denies any mechanical symptoms or instability of the knee.  He has no significant pain today.  Denies any history of knee surgery.  Denies any groin pain, radicular pain, low back pain.  Does not have to take any medications for his pain or use any braces.  Additionally, patient complains of continued numbness and tingling in his feet.  This has been going on for 6 months.  He has prediabetes with last A1c 5.8.  Denies any history of alcohol abuse recently.  He reports pain in the first MTP joint of his left foot.  He notes a particularly bothersome when he is walking and he has to extend the joint during his gait.  At his last office visit he reported numbness and tingling of both hands but now he is complaining primarily of numbness and tingling in his feet.  Right shoulder blade trigger point injection from last office visit provided 85% relief.  He does note some continued numbness and tingling and "nerve pain" in the trapezius on the right but overall he is satisfied with the pain relief.  Denies any neck pain itself..                ROS: All systems  reviewed are negative as they relate to the chief complaint within the history of present illness.  Patient denies fevers or chills.  Assessment & Plan: Visit Diagnoses:  1. Pain in both knees, unspecified chronicity   2. Numbness and tingling of both feet   3. Degenerative arthritis of toe joint, left     Plan: Patient is a 60 year old male who presents complaint of bilateral knee pain.  He has intermittent knee pain that he localizes to the anterior aspect of the knee and feels like it is behind his kneecap.  Pain is intermittent and comes and goes.  No history of injury.  No history of knee surgery.  No significant pain or tenderness elicited on exam today.  Radiographs are negative for any significant degenerative changes.  Differential includes chondromalacia patella versus patellofemoral arthritis without radiographic findings versus radicular pain.  Recommended patient try Mobic which was was prescribed today.  He will take this on days where he feels symptomatic as it does not cause him pain every day.    Additionally, with continued numbness/tingling of his bilateral feet, recommended nerve study for further evaluation.  He states that he will call the office to proceed with bilateral lower extremity nerve conduction study.  He complained of bilateral hand numbness and tingling at his last office visit in November but my his history today it seems that it is primarily the numbness and tingling in his feet that is causing distress.  Additionally notes from his primary care physician in October and July detail that he was having similar symptoms in his feet at that time.   Regarding the great toe pain, recommended he try a rigid insole to prevent/reduce range of motion at the first MTP joint.  Patient did have left foot radiographs in July 2021 that revealed significant degenerative changes of the first MTP joint.  He has correlating tenderness over this joint and pain with flexion/extension at  the joint.  If orthotic does not provide significant relief, consider injection in the joint or surgical referral to Dr. Lajoyce Corners for joint fusion.  Follow-up in 6 weeks for clinical recheck with Dr. August Saucer.  Follow-Up Instructions: No follow-ups on file.   Orders:  Orders Placed This Encounter  Procedures  . XR Knee 1-2 Views Right  . XR KNEE 3 VIEW LEFT   Meds ordered this encounter  Medications  . meloxicam (MOBIC) 15 MG tablet    Sig: Take 1 tablet (15 mg total) by mouth daily.    Dispense:  30 tablet    Refill:  0      Procedures: No procedures performed   Clinical Data: No additional findings.  Objective: Vital Signs: There were no vitals taken for this visit.  Physical Exam:  Constitutional: Patient appears well-developed HEENT:  Head: Normocephalic Eyes:EOM are normal Neck: Normal range of motion Cardiovascular: Normal rate Pulmonary/chest: Effort normal Neurologic: Patient is alert Skin: Skin is warm Psychiatric: Patient has normal mood and affect  Ortho Exam: Ortho exam demonstrates bilateral knees without effusion.  No significant tenderness over the medial or lateral joint lines bilaterally.  No calf tenderness.  Negative Homans' sign.  Able to extend the knee actively bilaterally with extensor mechanism intact.  No pain with hip range of motion.  Negative straight leg raise bilaterally.  No ligamentous laxity on exam to bilateral knees.  Tenderness palpation over the first MTP joint of the left foot.  Pain worse with flexion and extension passively at that joint.  Reduced flexion and extension of the first MTP joint of the left foot compared with the first MTP joint of the right foot.  Specialty Comments:  No specialty comments available.  Imaging: No results found.   PMFS History: Patient Active Problem List   Diagnosis Date Noted  . Biceps tendinitis of left upper extremity   . Rotator cuff arthropathy 02/16/2019  . FHx: colon cancer 12/18/2018  .  Vasculitis ([name]) 06/26/2018  . AKI (acute kidney injury) ([name]) 06/26/2018  . 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 Attention deficit disorder (ADD) in adult 11/10/2017  . Moderate episode of recurrent major depressive disorder ([name]) 08/31/2017  . 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 Disturbed concentration 08/31/2017  . Tobacco use disorder 08/31/2017  . History of substance abuse ([name]) 02/23/2017  . Depression 02/02/2013   Past Medical History:  Diagnosis Date  . ADD (attention deficit disorder)   . Depression     Family History  Problem Relation Age of Onset  . Cancer Mother   . Dementia Mother   . Diabetes Mother   . Heart Problems Mother   . Liver cancer Father   . Colon cancer Father   . Alcohol abuse Maternal Grandfather   . Alcohol abuse Paternal Grandfather   .  Colon polyps Sister   . Colon polyps Sister     Past Surgical History:  Procedure Laterality Date  . CHOLECYSTECTOMY    . GALLBLADDER SURGERY  2003  . SHOULDER ARTHROSCOPY Left 02/16/2019   LEFT SHOULDER ARTHROSCOPY LOWER TRAPEZIUS TENDON TRANSFER, BICEPS TENODESIS  . SHOULDER ARTHROSCOPY WITH DEBRIDEMENT AND BICEP TENDON REPAIR Left 02/16/2019   Procedure: LEFT SHOULDER ARTHROSCOPY LOWER TRAPEZIUS TENDON TRANSFER, BICEPS TENODESIS;  Surgeon: Meredith Pel, MD;  Location: [place];  Service: Orthopedics;  Laterality: Left;  . SKIN BIOPSY  2019   Social History   Occupational History  . Not on file  Tobacco Use  . Smoking status: Current Every Day Smoker    Packs/day: 2.00    Types: Cigarettes  . Smokeless tobacco: Never Used  Vaping Use  . Vaping Use: Former  Substance and Sexual Activity  . Alcohol use: No  . Drug use: No    Frequency: 7.0 times per week    Types: IV    Comment: history of Rx opoid dependency  . Sexual activity: Not on file

## 2020-08-18 ENCOUNTER — Other Ambulatory Visit: Payer: Self-pay

## 2020-08-18 ENCOUNTER — Ambulatory Visit: Payer: BC Managed Care – PPO | Admitting: Family Medicine

## 2020-08-18 ENCOUNTER — Encounter: Payer: Self-pay | Admitting: Family Medicine

## 2020-08-18 ENCOUNTER — Ambulatory Visit
Admission: RE | Admit: 2020-08-18 | Discharge: 2020-08-18 | Disposition: A | Discharge status: Home / Self Care | Payer: BC Managed Care – PPO | Source: Ambulatory Visit | Attending: Family Medicine | Admitting: Family Medicine

## 2020-08-18 ENCOUNTER — Other Ambulatory Visit: Payer: Self-pay | Admitting: Family Medicine

## 2020-08-18 VITALS — BP 143/74 | HR 85 | Temp 98.1°F | Ht 72.0 in | Wt 191.0 lb

## 2020-08-18 DIAGNOSIS — F17210 Nicotine dependence, cigarettes, uncomplicated: Secondary | ICD-10-CM

## 2020-08-18 DIAGNOSIS — R202 Paresthesia of skin: Secondary | ICD-10-CM | POA: Diagnosis not present

## 2020-08-18 DIAGNOSIS — R7303 Prediabetes: Secondary | ICD-10-CM | POA: Diagnosis not present

## 2020-08-18 DIAGNOSIS — R0600 Dyspnea, unspecified: Secondary | ICD-10-CM

## 2020-08-18 DIAGNOSIS — Z1211 Encounter for screening for malignant neoplasm of colon: Secondary | ICD-10-CM

## 2020-08-18 DIAGNOSIS — R059 Cough, unspecified: Secondary | ICD-10-CM | POA: Diagnosis not present

## 2020-08-18 DIAGNOSIS — R0609 Other forms of dyspnea: Secondary | ICD-10-CM

## 2020-08-18 DIAGNOSIS — D239 Other benign neoplasm of skin, unspecified: Secondary | ICD-10-CM

## 2020-08-18 DIAGNOSIS — F988 Other specified behavioral and emotional disorders with onset usually occurring in childhood and adolescence: Secondary | ICD-10-CM

## 2020-08-18 DIAGNOSIS — K148 Other diseases of tongue: Secondary | ICD-10-CM

## 2020-08-18 DIAGNOSIS — R9389 Abnormal findings on diagnostic imaging of other specified body structures: Secondary | ICD-10-CM

## 2020-08-18 NOTE — Patient Instructions (Addendum)
  If return of depression symptoms, restart celexa,  I will refer you to dermatology, ENT, and gastroenterology, as well as pulmonary.  I will check x-rays and some other lab work for your shortness of breath with exertion but please follow-up in 1 month to discuss that further as well as with pulmonary.  If any change in symptoms, worsening symptoms or chest pain be seen right away.  To get your chest x-ray go to: [facility], [place], [place],  [address]  No medication changes for now, let me know when refills are needed.  Thanks for coming in today.   If you have lab work done today you will be contacted with your lab results within the next 2 weeks.  If you have not heard from [place] then please contact us. The fastest way to get your results is to register for My Chart.   IF you received an x-ray today, you will receive an invoice from [facility]. Please contact [facility] at [phone] with questions or concerns regarding your invoice.   IF you received labwork today, you will receive an invoice from [place]. Please contact [facility] at [phone] with questions or concerns regarding your invoice.   Our billing staff will not be able to assist you with questions regarding bills from these companies.  You will be contacted with the lab results as soon as they are available. The fastest way to get your results is to activate your My Chart account. Instructions are located on the last page of this paperwork. If you have not heard from [place] regarding the results in 2 weeks, please contact this office.

## 2020-08-18 NOTE — Progress Notes (Signed)
Subjective:  Patient ID: BEAR GALA, male    DOB: [birth year]  Age: 60 y.o. MRN: [id number]  CC:  Chief Complaint  Patient presents with  . Transitions Of Care    Pt reports he feels well with no complaints. Pt isn't fasting. Pt is requesting referrals to gastro for colonoscopy. A referral to Derm to have his skin checked due to past skin cancer. PT is requesting a chest x-Zimmerman due to his smoking pt states he get winded easier now. Pt is requesting a referral to an ENT due to a spot on the center of his tongue.      HPI Louis Zimmerman presents for   Transition of care, previous patient Dr. Pamella Pert, as well as multiple concerns as above.  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   History of skin cancer Prior skin cancer removed left forearm few years ago. dysplastic compound nevus. No recurrence. Request derm eval.   Dyspnea on exertion History of tobacco use. Noticed more DOE past 6-8 months. Some cough, chronic smokers cough. More raspy, some cough increase. No wheeze.  1-2ppd current smoker. approx 40 pack year hx.  No recent pft.  No chest pains.   Tongue lesion: This was discussed with Dr. Pamella Pert in May 2020, white raised lesion in the middle of tongue, referred to ENT at that time. He was not seen by ENT - unknown reason. No change in lesion.    Foot dysesthesias: Treated with gabapentin 900mg  qhs.  Labs by Dr. Pamella Pert in July 2021 to rule out organic causes.  No sign of B12 deficiency, normal thyroid kidney and liver test at that time.  Mild prediabetes.  With A1c 5.8. mobic most days for foot pain.   Depression: Celexa 40 mg daily.  Improved when discussed in October on Celexa.  Anhedonia had resolved. Stopped taking med in November.  Denies depression or anhedonia.   Depression screen [facility] 2/9 08/18/2020 04/28/2020 03/10/2020 02/10/2020 08/19/2019  Decreased Interest 0 0 0 1 0  Down, Depressed, Hopeless 0 1 1 0 0  PHQ - 2 Score 0 1 1 1  0  Altered sleeping - 0 0 0 -   Tired, decreased energy - 0 0 1 -  Change in appetite - 0 0 0 -  Feeling bad or failure about yourself  - 0 0 0 -  Trouble concentrating - 0 0 0 -  Moving slowly or fidgety/restless - 0 0 0 -  Suicidal thoughts - 0 0 0 -  PHQ-9 Score - 1 1 2  -  Difficult doing work/chores - - Somewhat difficult Somewhat difficult -  Some recent data might be hidden   Attention deficit disorder Treated with Adderall 15 mg daily, Controlled substance database (PDMP) reviewed. No concerns appreciated.  Last filled December 29, previously November 29, previously October 28.  Urine drug screen consistent February 10, 2020. Symptoms controlled with current regimen. Taking 1/2 in am and 1/2 at lunch. 1/4 on weekends.   Due for colonoscopy - last one about 10 years ago - ? [place].   History Patient Active Problem List   Diagnosis Date Noted  . Biceps tendinitis of left upper extremity   . Rotator cuff arthropathy 02/16/2019  . FHx: colon cancer 12/18/2018  . Vasculitis ([place]) 06/26/2018  . AKI (acute kidney injury) ([place]) 06/26/2018  . 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 Attention deficit disorder (ADD) in adult 11/10/2017  . Moderate episode of recurrent major depressive disorder ([place]) 08/31/2017  . Other mixed anxiety disorders  08/31/2017  . Disturbed concentration 08/31/2017  . Tobacco use disorder 08/31/2017  . History of substance abuse ([place]) 02/23/2017  . Depression 02/02/2013   Past Medical History:  Diagnosis Date  . ADD (attention deficit disorder)   . Depression    Past Surgical History:  Procedure Laterality Date  . CHOLECYSTECTOMY    . GALLBLADDER SURGERY  2003  . SHOULDER ARTHROSCOPY Left 02/16/2019   LEFT SHOULDER ARTHROSCOPY LOWER TRAPEZIUS TENDON TRANSFER, BICEPS TENODESIS  . SHOULDER ARTHROSCOPY WITH DEBRIDEMENT AND BICEP TENDON REPAIR Left 02/16/2019   Procedure: LEFT SHOULDER ARTHROSCOPY LOWER TRAPEZIUS TENDON TRANSFER, BICEPS TENODESIS;  Surgeon: Meredith Pel, MD;  Location:  [facility];  Service: Orthopedics;  Laterality: Left;  . SKIN BIOPSY  2019   Allergies  Allergen Reactions  . Hydrocodone Anaphylaxis  . Sertraline     sedating   Prior to Admission medications   Medication Sig Start Date End Date Taking? Authorizing Provider  citalopram (CELEXA) 40 MG tablet Take 1 tablet (40 mg total) by mouth daily. 04/28/20  Yes Jacelyn Pi, Lilia Argue, MD  gabapentin (NEURONTIN) 300 MG capsule Take 1 capsule (300 mg total) by mouth every morning AND 2 capsules (600 mg total) at bedtime. 04/28/20  Yes Jacelyn Pi, Lilia Argue, MD  meloxicam (MOBIC) 15 MG tablet Take 1 tablet (15 mg total) by mouth daily. 04/28/20  Yes Jacelyn Pi, Lilia Argue, MD  meloxicam (MOBIC) 15 MG tablet Take 1 tablet (15 mg total) by mouth daily. 07/26/20  Yes Magnant, Gerrianne Scale, PA-C  amphetamine-dextroamphetamine (ADDERALL) 15 MG tablet Take 1 tablet by mouth daily before breakfast. 05/25/20 06/24/20  [name], MD  amphetamine-dextroamphetamine (ADDERALL) 15 MG tablet Take 1 tablet by mouth daily before breakfast. 05/25/20 06/24/20  [name], MD  amphetamine-dextroamphetamine (ADDERALL) 15 MG tablet Take 1 tablet by mouth daily before breakfast. 05/25/20 06/24/20  [name], MD  ASPIRIN 81 PO Take by mouth.    [provider]   Social History   Socioeconomic History  . Marital status: Divorced    Spouse name: Not on file  . Number of children: Not on file  . Years of education: Not on file  . Highest education level: Not on file  Occupational History  . Not on file  Tobacco Use  . Smoking status: Current Every Day Smoker    Packs/day: 2.00    Types: Cigarettes  . Smokeless tobacco: Never Used  Vaping Use  . Vaping Use: Former  Substance and Sexual Activity  . Alcohol use: No  . Drug use: No    Frequency: 7.0 times per week    Types: IV    Comment: history of Rx opoid dependency  . Sexual activity: Not on file  Other Topics Concern  . Not on file  Social  History Narrative  . Not on file   Social Determinants of Health   Financial Resource Strain: Not on file  Food Insecurity: Not on file  Transportation Needs: Not on file  Physical Activity: Not on file  Stress: Not on file  Social Connections: Not on file  Intimate Partner Violence: Not on file    Review of Systems Per HPI.   Objective:   Vitals:   08/18/20 0848  BP: (!) 143/74  Pulse: 85  Temp: 98.1 F (36.7 C)  TempSrc: Temporal  SpO2: 99%  Weight: 191 lb (86.6 kg)  Height: 6' (1.829 m)     Physical Exam Vitals reviewed.  Constitutional:  Appearance: He is well-developed and well-nourished.  HENT:     Head: Normocephalic and atraumatic.     Mouth/Throat:     Comments: White patch center midline tongue.  Eyes:     Extraocular Movements: EOM normal.     Pupils: Pupils are equal, round, and reactive to light.  Neck:     Vascular: No carotid bruit or JVD.  Cardiovascular:     Rate and Rhythm: Normal rate and regular rhythm.     Heart sounds: Normal heart sounds. No murmur heard.   Pulmonary:     Effort: Pulmonary effort is normal.     Breath sounds: Normal breath sounds. No rales.  Musculoskeletal:        General: No edema.  Skin:    General: Skin is warm and dry.  Neurological:     Mental Status: He is alert and oriented to person, place, and time.  Psychiatric:        Mood and Affect: Mood and affect normal.    EKG: EKG sinus rhythm, rate 65.  Incomplete RBBB, anterior fascicular block, no apparent changes from 06/26/2018, no acute findings.    DG Chest 2 View  Result Date: 08/18/2020 CLINICAL DATA:  Cough.  Dyspnea on exertion.  Smoker. EXAM: CHEST - 2 VIEW COMPARISON:  06/26/2018 and 09/13/2017 and chest CT 06/17/2018 FINDINGS: Subtle densities in the right lower chest could be related to nipple shadow or overlying soft tissues but indeterminate. Again noted is lucency at the lung apices and patient has known emphysema. No large areas of  volume loss or lung consolidation. Mild flattening of the hemidiaphragms with increased retrosternal aeration. Findings are compatible with emphysema. No large pleural effusions. Heart and mediastinum are within normal limits. IMPRESSION: 1. Subtle densities in the right lower chest that are indeterminate. It is possible this is related to overlying soft tissue or nipple shadow. However, based on patient's smoking history and emphysema, recommend consider further characterization with a chest CT to exclude a subtle lung lesion or nodule in this area. 2. Emphysematous changes. These results will be called to the ordering clinician or representative by the Radiologist Assistant, and communication documented in the PACS or [employer]. Electronically Signed   By: Markus Daft M.D.   On: 08/18/2020 11:11     Assessment & Plan:  Louis Zimmerman is a 60 y.o. male . DOE (dyspnea on exertion) - Plan: CBC, EKG 12-Lead, Pro b natriuretic peptide, Ambulatory referral to Pulmonology, DG Chest 2 View, CANCELED: DG Chest 2 View Cough - Plan: Ambulatory referral to Pulmonology  -Initially suspicious for possible COPD with smoking history.  No concerning findings on EKG, no chest pains.  Chest x-Zimmerman obtained after office visit did not indicate abnormality as above.  Will order CT, also has pulmonary referral pending.  Recheck 1 month with ER/RTC precautions. Prediabetes - Plan: Hemoglobin A1c  Paresthesia of foot, bilateral  -Stable on gabapentin  Tongue lesion - Plan: Ambulatory referral to ENT  -Longstanding lesion but will have ENT eval at his request, may need oral surgery consult.  Dysplastic nevus - Plan: Ambulatory referral to Dermatology  -History of dysplastic nevus, refer to Derm for ongoing skin cancer screening.  Cigarette nicotine dependence without complication - Plan: CBC, Ambulatory referral to Pulmonology, DG Chest 2 View, CANCELED: DG Chest 2 View  -Cessation discussed, handout given on  steps to quitting smoking.  Special screening for malignant neoplasms, colon - Plan: Ambulatory referral to Gastroenterology  -Refer for colonoscopy  Attention deficit disorder (ADD) in adult  -Stable, continue same dose Adderall, refill when due.   No orders of the defined types were placed in this encounter.  Patient Instructions    If return of depression symptoms, restart celexa,  I will refer you to dermatology, ENT, and gastroenterology, as well as pulmonary.  I will check x-rays and some other lab work for your shortness of breath with exertion but please follow-up in 1 month to discuss that further as well as with pulmonary.  If any change in symptoms, worsening symptoms or chest pain be seen right away.  No medication changes for now, let me know when refills are needed.  Thanks for coming in today.   If you have lab work done today you will be contacted with your lab results within the next 2 weeks.  If you have not heard from [place] then please contact us. The fastest way to get your results is to register for My Chart.   IF you received an x-Zimmerman today, you will receive an invoice from [facility]. Please contact [facility] at [phone] with questions or concerns regarding your invoice.   IF you received labwork today, you will receive an invoice from [place]. Please contact [facility] at [phone] with questions or concerns regarding your invoice.   Our billing staff will not be able to assist you with questions regarding bills from these companies.  You will be contacted with the lab results as soon as they are available. The fastest way to get your results is to activate your My Chart account. Instructions are located on the last page of this paperwork. If you have not heard from [place] regarding the results in 2 weeks, please contact this office.         Signed, Louis Ray, MD [facility]

## 2020-08-18 NOTE — Telephone Encounter (Signed)
Pt was seen this am and requesting Adderall. I did not see any mention of it during the ov according to the note.

## 2020-08-18 NOTE — Telephone Encounter (Signed)
Please advise 

## 2020-08-19 ENCOUNTER — Encounter: Payer: Self-pay | Admitting: Family Medicine

## 2020-08-19 LAB — CBC
Hematocrit: 45.4 % (ref 37.5–51.0)
Hemoglobin: 15.5 g/dL (ref 13.0–17.7)
MCH: 30.6 pg (ref 26.6–33.0)
MCHC: 34.1 g/dL (ref 31.5–35.7)
MCV: 90 fL (ref 79–97)
Platelets: 342 10*3/uL (ref 150–450)
RBC: 5.06 x10E6/uL (ref 4.14–5.80)
RDW: 12.8 % (ref 11.6–15.4)
WBC: 8.3 10*3/uL (ref 3.4–10.8)

## 2020-08-19 LAB — HEMOGLOBIN A1C
Est. average glucose Bld gHb Est-mCnc: 123 mg/dL
Hgb A1c MFr Bld: 5.9 % — ABNORMAL HIGH (ref 4.8–5.6)

## 2020-08-19 LAB — PRO B NATRIURETIC PEPTIDE: NT-Pro BNP: 28 pg/mL (ref 0–210)

## 2020-08-19 MED ORDER — AMPHETAMINE-DEXTROAMPHETAMINE 15 MG PO TABS: 15.0000 mg | ORAL_TABLET | Freq: Every day | ORAL | 0 refills | Status: DC

## 2020-08-19 NOTE — Telephone Encounter (Signed)
Last filled 07/26/20. Refills ordered starting 08/25/20.

## 2020-08-20 MED ORDER — MELOXICAM 15 MG PO TABS: 15.0000 mg | ORAL_TABLET | Freq: Every day | ORAL | 0 refills | Status: DC

## 2020-08-21 ENCOUNTER — Telehealth: Payer: Self-pay | Admitting: Family Medicine

## 2020-08-21 NOTE — Telephone Encounter (Signed)
Pt is wanting a cb concerning his most recent labs. Please advise at [phone].

## 2020-08-21 NOTE — Telephone Encounter (Signed)
Called pt back no answer, but left a VM.

## 2020-08-25 ENCOUNTER — Encounter: Payer: Self-pay | Admitting: Physical Medicine and Rehabilitation

## 2020-08-25 ENCOUNTER — Ambulatory Visit: Payer: BC Managed Care – PPO | Admitting: Physical Medicine and Rehabilitation

## 2020-08-25 ENCOUNTER — Other Ambulatory Visit: Payer: Self-pay

## 2020-08-25 DIAGNOSIS — R202 Paresthesia of skin: Secondary | ICD-10-CM

## 2020-08-25 NOTE — Progress Notes (Signed)
Pt state right shoulder pain. Pt state when lifting his shoulder makes the pain worse. Pt state he takes pain meds to help ease his pain. Pt state he has to sleep with his right hand close to his chest. Pt state his right handed.  Numeric Pain Rating Scale and Functional Assessment Average Pain 7   In the last MONTH (on 0-10 scale) has pain interfered with the following?  1. General activity like being  able to carry out your everyday physical activities such as walking, climbing stairs, carrying groceries, or moving a chair?  Rating(10)

## 2020-08-29 NOTE — Progress Notes (Signed)
Louis Zimmerman - 60 y.o. male MRN [id number]  Date of birth: Jun 25, 1961  Office Visit Note: Visit Date: 08/25/2020 PCP: Louis Agreste, MD Referred by: Louis Zimmerman, Louis Zimmerman, *  Subjective: Chief Complaint  Patient presents with  . Right Shoulder - Pain  . Right Hand - Numbness  . Left Hand - Numbness   HPI:  Louis Zimmerman is a 60 y.o. male who comes in today at the request of Louis Main, PA-C for electrodiagnostic study of the Bilateral upper extremities.  Patient is Right hand dominant.  He complains of 7 out of 10 pain in general particularly with right shoulder pain worse with lifting but also with bilateral numbness and tingling right more than left hands in a nondermatomal global fashion.  He reports the right hand feels like it goes to sleep when he has it close to his chest.  He also reports constant bilateral foot numbness.  No prior history of polyneuropathy.  No prior electrodiagnostic study.   ROS Otherwise per HPI.  Assessment & Plan: Visit Diagnoses:    ICD-10-CM   1. Paresthesia of skin  R20.2 NCV with EMG (electromyography)    Plan: Impression: The above electrodiagnostic study is ABNORMAL and reveals evidence of:  1.  A severe right median nerve entrapment at the wrist (carpal tunnel syndrome) affecting sensory and motor components.   2.  A moderate to severe left median nerve entrapment at the wrist (carpal tunnel syndrome) affecting sensory and motor components.  There is no significant electrodiagnostic evidence of any other focal nerve entrapment, brachial plexopathy or cervical radiculopathy or generalized peripheral neuropathy.   Recommendations: 1.  Follow-up with referring physician. 2.  Continue current management of symptoms. 3.  Suggest surgical evaluation. Suggest consult with Neurology foot numbness if appropriate.  Meds & Orders: No orders of the defined types were placed in this encounter.   Orders Placed This Encounter   Procedures  . NCV with EMG (electromyography)    Follow-up: Return in about 2 weeks (around 09/08/2020) for [employer], PA-C.   Procedures: No procedures performed  EMG & NCV Findings: Evaluation of the left median motor and the right median motor nerves showed prolonged distal onset latency (L4.5, R4.4 ms), reduced amplitude (L4.9, R3.2 mV), and decreased conduction velocity (Elbow-Wrist, L47, R48 m/s).  The left median (across palm) sensory nerve showed prolonged distal peak latency (Wrist, 4.5 ms), reduced amplitude (9.8 V), and prolonged distal peak latency (Palm, 2.3 ms).  The right median (across palm) sensory nerve showed no response (Palm) and prolonged distal peak latency (4.6 ms).  All remaining nerves (as indicated in the following tables) were within normal limits.  Left vs. Right side comparison data for the ulnar motor nerve indicates abnormal L-R latency difference (0.8 ms).  All remaining left vs. right side differences were within normal limits.    All examined muscles (as indicated in the following table) showed no evidence of electrical instability.    Impression: The above electrodiagnostic study is ABNORMAL and reveals evidence of:  1.  A severe right median nerve entrapment at the wrist (carpal tunnel syndrome) affecting sensory and motor components.   2.  A moderate to severe left median nerve entrapment at the wrist (carpal tunnel syndrome) affecting sensory and motor components.  There is no significant electrodiagnostic evidence of any other focal nerve entrapment, brachial plexopathy or cervical radiculopathy or generalized peripheral neuropathy.   Recommendations: 1.  Follow-up with referring physician. 2.  Continue current  management of symptoms. 3.  Suggest surgical evaluation. Suggest consult with Neurology foot numbness if appropriate.  ___________________________ Louis Zimmerman Board Certified, American Board of Physical Medicine and  Rehabilitation    Nerve Conduction Studies Anti Sensory Summary Table   Stim Site NR Peak (ms) Norm Peak (ms) P-T Amp (V) Norm P-T Amp Site1 Site2 Delta-P (ms) Dist (cm) Vel (m/s) Norm Vel (m/s)  Left Median Acr Palm Anti Sensory (2nd Digit)  30.5C  Wrist    *4.5 <3.6 *9.8 >10 Wrist Palm 2.2 0.0    Palm    *2.3 <2.0 1.6         Right Median Acr Palm Anti Sensory (2nd Digit)  30.2C  Wrist    *4.6 <3.6 17.4 >10 Wrist Palm  0.0    Palm *NR  <2.0          Left Radial Anti Sensory (Base 1st Digit)  31.7C  Wrist    2.1 <3.1 36.7  Wrist Base 1st Digit 2.1 0.0    Right Radial Anti Sensory (Base 1st Digit)  31.3C  Wrist    2.3 <3.1 25.0  Wrist Base 1st Digit 2.3 0.0    Left Ulnar Anti Sensory (5th Digit)  31.4C  Wrist    3.4 <3.7 17.3 >15.0 Wrist 5th Digit 3.4 14.0 41 >38  Right Ulnar Anti Sensory (5th Digit)  31C  Wrist    3.3 <3.7 19.9 >15.0 Wrist 5th Digit 3.3 14.0 42 >38   Motor Summary Table   Stim Site NR Onset (ms) Norm Onset (ms) O-P Amp (mV) Norm O-P Amp Site1 Site2 Delta-0 (ms) Dist (cm) Vel (m/s) Norm Vel (m/s)  Left Median Motor (Abd Poll Brev)  31.8C  Wrist    *4.5 <4.2 *4.9 >5 Elbow Wrist 4.7 22.0 *47 >50  Elbow    9.2  4.6         Right Median Motor (Abd Poll Brev)  31.4C  Wrist    *4.4 <4.2 *3.2 >5 Elbow Wrist 4.9 23.5 *48 >50  Elbow    9.3  3.2         Left Ulnar Motor (Abd Dig Min)  32C  Wrist    3.6 <4.2 8.7 >3 B Elbow Wrist 3.5 22.0 63 >53  B Elbow    7.1  7.5  A Elbow B Elbow 1.6 10.0 63 >53  A Elbow    8.7  7.2         Right Ulnar Motor (Abd Dig Min)  31.6C  Wrist    2.8 <4.2 6.9 >3 B Elbow Wrist 3.8 22.5 59 >53  B Elbow    6.6  5.7  A Elbow B Elbow 1.5 10.0 67 >53  A Elbow    8.1  5.7          EMG   Side Muscle Nerve Root Ins Act Fibs Psw Amp Dur Poly Recrt Int Dennie Bible Comment  Right 1stDorInt Ulnar C8-T1 Nml Nml Nml Nml Nml 0 Nml Nml   Right Abd Poll Brev Median C8-T1 Nml Nml Nml Nml Nml 0 Nml Nml   Right ExtDigCom   Nml Nml Nml Nml Nml 0 Nml Nml    Right Triceps Radial C6-7-8 Nml Nml Nml Nml Nml 0 Nml Nml   Right Deltoid Axillary C5-6 Nml Nml Nml Nml Nml 0 Nml Nml     Nerve Conduction Studies Anti Sensory Left/Right Comparison   Stim Site L Lat (ms) R Lat (ms) L-R Lat (ms) L Amp (V) R  Amp (V) L-R Amp (%) Site1 Site2 L Vel (m/s) R Vel (m/s) L-R Vel (m/s)  Median Acr Palm Anti Sensory (2nd Digit)  30.5C  Wrist *4.5 *4.6 0.1 *9.8 17.4 43.7 Wrist Palm     Palm *2.3   1.6         Radial Anti Sensory (Base 1st Digit)  31.7C  Wrist 2.1 2.3 0.2 36.7 25.0 31.9 Wrist Base 1st Digit     Ulnar Anti Sensory (5th Digit)  31.4C  Wrist 3.4 3.3 0.1 17.3 19.9 13.1 Wrist 5th Digit 41 42 1   Motor Left/Right Comparison   Stim Site L Lat (ms) R Lat (ms) L-R Lat (ms) L Amp (mV) R Amp (mV) L-R Amp (%) Site1 Site2 L Vel (m/s) R Vel (m/s) L-R Vel (m/s)  Median Motor (Abd Poll Brev)  31.8C  Wrist *4.5 *4.4 0.1 *4.9 *3.2 34.7 Elbow Wrist *47 *48 1  Elbow 9.2 9.3 0.1 4.6 3.2 30.4       Ulnar Motor (Abd Dig Min)  32C  Wrist 3.6 2.8 *0.8 8.7 6.9 20.7 B Elbow Wrist 63 59 4  B Elbow 7.1 6.6 0.5 7.5 5.7 24.0 A Elbow B Elbow 63 67 4  A Elbow 8.7 8.1 0.6 7.2 5.7 20.8          Waveforms:                      Clinical History: No specialty comments available.     Objective:  VS:  HT:    WT:   BMI:     BP:   HR: bpm  TEMP: ( )  RESP:  Physical Exam Musculoskeletal:        General: No tenderness.     Comments: Inspection reveals no atrophy of the bilateral APB or FDI or hand intrinsics. There is no swelling, color changes, allodynia or dystrophic changes. There is 5 out of 5 strength in the bilateral wrist extension, finger abduction and long finger flexion. There is intact sensation to light touch in all dermatomal and peripheral nerve distributions.  There is a positive Phalen's test bilaterally. There is a negative Hoffmann's test bilaterally.  Skin:    General: Skin is warm and dry.     Findings: No erythema or rash.   Neurological:     General: No focal deficit present.     Mental Status: He is alert and oriented to person, place, and time.     Sensory: No sensory deficit.     Motor: No weakness or abnormal muscle tone.     Coordination: Coordination normal.     Gait: Gait normal.  Psychiatric:        Mood and Affect: Mood normal.        Behavior: Behavior normal.        Thought Content: Thought content normal.      Imaging: No results found.

## 2020-08-29 NOTE — Procedures (Signed)
EMG & NCV Findings: Evaluation of the left median motor and the right median motor nerves showed prolonged distal onset latency (L4.5, R4.4 ms), reduced amplitude (L4.9, R3.2 mV), and decreased conduction velocity (Elbow-Wrist, L47, R48 m/s).  The left median (across palm) sensory nerve showed prolonged distal peak latency (Wrist, 4.5 ms), reduced amplitude (9.8 V), and prolonged distal peak latency (Palm, 2.3 ms).  The right median (across palm) sensory nerve showed no response (Palm) and prolonged distal peak latency (4.6 ms).  All remaining nerves (as indicated in the following tables) were within normal limits.  Left vs. Right side comparison data for the ulnar motor nerve indicates abnormal L-R latency difference (0.8 ms).  All remaining left vs. right side differences were within normal limits.    All examined muscles (as indicated in the following table) showed no evidence of electrical instability.    Impression: The above electrodiagnostic study is ABNORMAL and reveals evidence of:  1.  A severe right median nerve entrapment at the wrist (carpal tunnel syndrome) affecting sensory and motor components.   2.  A moderate to severe left median nerve entrapment at the wrist (carpal tunnel syndrome) affecting sensory and motor components.  There is no significant electrodiagnostic evidence of any other focal nerve entrapment, brachial plexopathy or cervical radiculopathy or generalized peripheral neuropathy.   Recommendations: 1.  Follow-up with referring physician. 2.  Continue current management of symptoms. 3.  Suggest surgical evaluation. Suggest consult with Neurology foot numbness if appropriate.  ___________________________ Wonda Olds Board Certified, American Board of Physical Medicine and Rehabilitation    Nerve Conduction Studies Anti Sensory Summary Table   Stim Site NR Peak (ms) Norm Peak (ms) P-T Amp (V) Norm P-T Amp Site1 Site2 Delta-P (ms) Dist (cm) Vel (m/s)  Norm Vel (m/s)  Left Median Acr Palm Anti Sensory (2nd Digit)  30.5C  Wrist    *4.5 <3.6 *9.8 >10 Wrist Palm 2.2 0.0    Palm    *2.3 <2.0 1.6         Right Median Acr Palm Anti Sensory (2nd Digit)  30.2C  Wrist    *4.6 <3.6 17.4 >10 Wrist Palm  0.0    Palm *NR  <2.0          Left Radial Anti Sensory (Base 1st Digit)  31.7C  Wrist    2.1 <3.1 36.7  Wrist Base 1st Digit 2.1 0.0    Right Radial Anti Sensory (Base 1st Digit)  31.3C  Wrist    2.3 <3.1 25.0  Wrist Base 1st Digit 2.3 0.0    Left Ulnar Anti Sensory (5th Digit)  31.4C  Wrist    3.4 <3.7 17.3 >15.0 Wrist 5th Digit 3.4 14.0 41 >38  Right Ulnar Anti Sensory (5th Digit)  31C  Wrist    3.3 <3.7 19.9 >15.0 Wrist 5th Digit 3.3 14.0 42 >38   Motor Summary Table   Stim Site NR Onset (ms) Norm Onset (ms) O-P Amp (mV) Norm O-P Amp Site1 Site2 Delta-0 (ms) Dist (cm) Vel (m/s) Norm Vel (m/s)  Left Median Motor (Abd Poll Brev)  31.8C  Wrist    *4.5 <4.2 *4.9 >5 Elbow Wrist 4.7 22.0 *47 >50  Elbow    9.2  4.6         Right Median Motor (Abd Poll Brev)  31.4C  Wrist    *4.4 <4.2 *3.2 >5 Elbow Wrist 4.9 23.5 *48 >50  Elbow    9.3  3.2  Left Ulnar Motor (Abd Dig Min)  32C  Wrist    3.6 <4.2 8.7 >3 B Elbow Wrist 3.5 22.0 63 >53  B Elbow    7.1  7.5  A Elbow B Elbow 1.6 10.0 63 >53  A Elbow    8.7  7.2         Right Ulnar Motor (Abd Dig Min)  31.6C  Wrist    2.8 <4.2 6.9 >3 B Elbow Wrist 3.8 22.5 59 >53  B Elbow    6.6  5.7  A Elbow B Elbow 1.5 10.0 67 >53  A Elbow    8.1  5.7          EMG   Side Muscle Nerve Root Ins Act Fibs Psw Amp Dur Poly Recrt Int Fraser Din Comment  Right 1stDorInt Ulnar C8-T1 Nml Nml Nml Nml Nml 0 Nml Nml   Right Abd Poll Brev Median C8-T1 Nml Nml Nml Nml Nml 0 Nml Nml   Right ExtDigCom   Nml Nml Nml Nml Nml 0 Nml Nml   Right Triceps Radial C6-7-8 Nml Nml Nml Nml Nml 0 Nml Nml   Right Deltoid Axillary C5-6 Nml Nml Nml Nml Nml 0 Nml Nml     Nerve Conduction Studies Anti Sensory Left/Right  Comparison   Stim Site L Lat (ms) R Lat (ms) L-R Lat (ms) L Amp (V) R Amp (V) L-R Amp (%) Site1 Site2 L Vel (m/s) R Vel (m/s) L-R Vel (m/s)  Median Acr Palm Anti Sensory (2nd Digit)  30.5C  Wrist *4.5 *4.6 0.1 *9.8 17.4 43.7 Wrist Palm     Palm *2.3   1.6         Radial Anti Sensory (Base 1st Digit)  31.7C  Wrist 2.1 2.3 0.2 36.7 25.0 31.9 Wrist Base 1st Digit     Ulnar Anti Sensory (5th Digit)  31.4C  Wrist 3.4 3.3 0.1 17.3 19.9 13.1 Wrist 5th Digit 41 42 1   Motor Left/Right Comparison   Stim Site L Lat (ms) R Lat (ms) L-R Lat (ms) L Amp (mV) R Amp (mV) L-R Amp (%) Site1 Site2 L Vel (m/s) R Vel (m/s) L-R Vel (m/s)  Median Motor (Abd Poll Brev)  31.8C  Wrist *4.5 *4.4 0.1 *4.9 *3.2 34.7 Elbow Wrist *47 *48 1  Elbow 9.2 9.3 0.1 4.6 3.2 30.4       Ulnar Motor (Abd Dig Min)  32C  Wrist 3.6 2.8 *0.8 8.7 6.9 20.7 B Elbow Wrist 63 59 4  B Elbow 7.1 6.6 0.5 7.5 5.7 24.0 A Elbow B Elbow 63 67 4  A Elbow 8.7 8.1 0.6 7.2 5.7 20.8          Waveforms:

## 2020-08-30 DIAGNOSIS — L821 Other seborrheic keratosis: Secondary | ICD-10-CM | POA: Diagnosis not present

## 2020-08-30 DIAGNOSIS — D225 Melanocytic nevi of trunk: Secondary | ICD-10-CM | POA: Diagnosis not present

## 2020-08-30 DIAGNOSIS — D1801 Hemangioma of skin and subcutaneous tissue: Secondary | ICD-10-CM | POA: Diagnosis not present

## 2020-08-30 DIAGNOSIS — L814 Other melanin hyperpigmentation: Secondary | ICD-10-CM | POA: Diagnosis not present

## 2020-09-06 ENCOUNTER — Ambulatory Visit: Payer: BC Managed Care – PPO | Admitting: Surgical

## 2020-09-06 DIAGNOSIS — M7541 Impingement syndrome of right shoulder: Secondary | ICD-10-CM

## 2020-09-06 DIAGNOSIS — G5603 Carpal tunnel syndrome, bilateral upper limbs: Secondary | ICD-10-CM | POA: Diagnosis not present

## 2020-09-08 DIAGNOSIS — K1230 Oral mucositis (ulcerative), unspecified: Secondary | ICD-10-CM | POA: Diagnosis not present

## 2020-09-10 ENCOUNTER — Encounter: Payer: Self-pay | Admitting: Surgical

## 2020-09-10 DIAGNOSIS — M7541 Impingement syndrome of right shoulder: Secondary | ICD-10-CM | POA: Diagnosis not present

## 2020-09-10 MED ORDER — BUPIVACAINE HCL 0.5 % IJ SOLN
9.0000 mL | INTRAMUSCULAR | Status: AC | PRN
  Administered 2020-09-10: 9 mL via INTRA_ARTICULAR

## 2020-09-10 MED ORDER — METHYLPREDNISOLONE ACETATE 40 MG/ML IJ SUSP
40.0000 mg | INTRAMUSCULAR | Status: AC | PRN
  Administered 2020-09-10: 40 mg via INTRA_ARTICULAR

## 2020-09-10 MED ORDER — LIDOCAINE HCL 1 % IJ SOLN
5.0000 mL | INTRAMUSCULAR | Status: AC | PRN
  Administered 2020-09-10: 5 mL

## 2020-09-10 NOTE — Progress Notes (Signed)
Office Visit Note   Patient: Louis Zimmerman           Date of Birth: 1961/02/16           MRN: [id number] Visit Date: 09/06/2020 Requested by: Daleen Squibb, [place],  [place] [address] PCP: [name], MD  Subjective: Chief Complaint  Patient presents with  . Other     Follow up    HPI: Louis Zimmerman is a 60 y.o. male who presents to the office complaining of right shoulder pain.  Patient complains of right shoulder pain over the last month.  He states this is different from his previous shoulder pain.  Last he complained of right shoulder pain was in his scapula but now he localizes it to the lateral and superior aspects of the right shoulder.  Denies any radiation of pain.  Does note occasional numbness and tingling in this area.  Denies any significant neck pain.  Does not feel any weakness of the shoulder.  He takes ibuprofen for pain control.  His shoulder pain is making it difficult to sleep at night.  Denies any recent injury.  Patient also returns to discuss recent nerve conduction study of bilateral upper extremity.  Nerve conduction study revealed severe right median nerve entrapment (carpal tunnel syndrome).  Also present was moderate to severe left carpal tunnel syndrome.  He has had numbness and tingling in his hands for several years.  He often drops tools at work to the point where coworkers know not to stand beneath him when he is working..                ROS: All systems reviewed are negative as they relate to the chief complaint within the history of present illness.  Patient denies fevers or chills.  Assessment & Plan: Visit Diagnoses:  1. Impingement syndrome of right shoulder   2. Bilateral carpal tunnel syndrome     Plan: Patient is a 60 year old male who presents to review nerve conduction study and with new complaint of right shoulder pain.  Right shoulder pain has been ongoing for about 1 month without injury.   No rotator cuff weakness on exam.  He does have some suggestion of cervical pathology with numbness and tingling in the region of the shoulder as well as some previous cervical spine radiographs that showed mild to moderate degenerative changes throughout.  However, he has positive impingement signs so plan to administer right shoulder subacromial injection for diagnostic and therapeutic purposes.  Plan to reevaluate the right shoulder at the first postop appointment for his carpal tunnel release.  Discussed carpal tunnel release including the risks and benefits of the procedure and the recovery time frame and what it entails.  He understands that due to the longstanding nature of his symptoms, he may not have 100% relief following the procedure.  He would like to proceed regardless.  Plan to post patient for right carpal tunnel release preferably on a Thursday as he wants to return to work as soon as possible.  Follow-Up Instructions: No follow-ups on file.   Orders:  No orders of the defined types were placed in this encounter.  No orders of the defined types were placed in this encounter.     Procedures: Large Joint Inj: R subacromial bursa on 09/10/2020 12:26 PM Indications: diagnostic evaluation and pain Details: 18 G 1.5 in needle, posterior approach  Arthrogram: No  Medications: 9 mL bupivacaine  0.5 %; 40 mg methylPREDNISolone acetate 40 MG/ML; 5 mL lidocaine 1 % Outcome: tolerated well, no immediate complications Procedure, treatment alternatives, risks and benefits explained, specific risks discussed. Consent was given by the patient. Immediately prior to procedure a time out was called to verify the correct patient, procedure, equipment, support staff and site/side marked as required. Patient was prepped and draped in the usual sterile fashion.       Clinical Data: No additional findings.  Objective: Vital Signs: There were no vitals taken for this visit.  Physical Exam:   Constitutional: Patient appears well-developed HEENT:  Head: Normocephalic Eyes:EOM are normal Neck: Normal range of motion Cardiovascular: Normal rate Pulmonary/chest: Effort normal Neurologic: Patient is alert Skin: Skin is warm Psychiatric: Patient has normal mood and affect  Ortho Exam: Ortho exam demonstrates right shoulder with 80 degrees external rotation, 90 degrees abduction, 150 degrees forward flexion.  Excellent rotator cuff strength with 5/5 motor strength of supraspinatus, infraspinatus, subscapularis.  No significant tenderness over the [facility] joint or bicipital groove.  Positive Neer and Hawkins impingement signs.  No tenderness throughout the axial cervical spine.  Negative Spurling today.  Specialty Comments:  No specialty comments available.  Imaging: No results found.   PMFS History: Patient Active Problem List   Diagnosis Date Noted  . Biceps tendinitis of left upper extremity   . Rotator cuff arthropathy 02/16/2019  . FHx: colon cancer 12/18/2018  . Vasculitis ([place]) 06/26/2018  . AKI (acute kidney injury) ([place]) 06/26/2018  . 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 Attention deficit disorder (ADD) in adult 11/10/2017  . Moderate episode of recurrent major depressive disorder ([place]) 08/31/2017  . 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 Disturbed concentration 08/31/2017  . Tobacco use disorder 08/31/2017  . History of substance abuse ([place]) 02/23/2017  . Depression 02/02/2013   Past Medical History:  Diagnosis Date  . ADD (attention deficit disorder)   . Depression     Family History  Problem Relation Age of Onset  . Cancer Mother   . Dementia Mother   . Diabetes Mother   . Heart Problems Mother   . Liver cancer Father   . Colon cancer Father   . Alcohol abuse Maternal Grandfather   . Alcohol abuse Paternal Grandfather   . Colon polyps Sister   . Colon polyps Sister     Past Surgical History:  Procedure Laterality Date  . CHOLECYSTECTOMY    .  GALLBLADDER SURGERY  2003  . SHOULDER ARTHROSCOPY Left 02/16/2019   LEFT SHOULDER ARTHROSCOPY LOWER TRAPEZIUS TENDON TRANSFER, BICEPS TENODESIS  . SHOULDER ARTHROSCOPY WITH DEBRIDEMENT AND BICEP TENDON REPAIR Left 02/16/2019   Procedure: LEFT SHOULDER ARTHROSCOPY LOWER TRAPEZIUS TENDON TRANSFER, BICEPS TENODESIS;  Surgeon: Meredith Pel, MD;  Location: [facility];  Service: Orthopedics;  Laterality: Left;  . SKIN BIOPSY  2019   Social History   Occupational History  . Not on file  Tobacco Use  . Smoking status: Current Every Day Smoker    Packs/day: 2.00    Types: Cigarettes  . Smokeless tobacco: Never Used  Vaping Use  . Vaping Use: Former  Substance and Sexual Activity  . Alcohol use: No  . Drug use: No    Frequency: 7.0 times per week    Types: IV    Comment: history of Rx opoid dependency  . Sexual activity: Not on file

## 2020-09-18 ENCOUNTER — Encounter: Payer: Self-pay | Admitting: Internal Medicine

## 2020-09-20 ENCOUNTER — Encounter: Payer: Self-pay | Admitting: Family Medicine

## 2020-09-20 ENCOUNTER — Ambulatory Visit: Payer: BC Managed Care – PPO | Admitting: Family Medicine

## 2020-09-20 ENCOUNTER — Other Ambulatory Visit: Payer: Self-pay

## 2020-09-20 VITALS — BP 128/86 | HR 99 | Temp 97.4°F | Ht 72.0 in | Wt 189.0 lb

## 2020-09-20 DIAGNOSIS — F331 Major depressive disorder, recurrent, moderate: Secondary | ICD-10-CM

## 2020-09-20 DIAGNOSIS — Z8249 Family history of ischemic heart disease and other diseases of the circulatory system: Secondary | ICD-10-CM

## 2020-09-20 DIAGNOSIS — F17210 Nicotine dependence, cigarettes, uncomplicated: Secondary | ICD-10-CM

## 2020-09-20 DIAGNOSIS — R0600 Dyspnea, unspecified: Secondary | ICD-10-CM

## 2020-09-20 DIAGNOSIS — M67449 Ganglion, unspecified hand: Secondary | ICD-10-CM | POA: Diagnosis not present

## 2020-09-20 DIAGNOSIS — R0609 Other forms of dyspnea: Secondary | ICD-10-CM

## 2020-09-20 NOTE — Patient Instructions (Addendum)
I will refer you to cardiology and check into the CT scan and pulmonary referrals.  Avoid exertional activities if possible until evaluated by cardiology and lung specialist.  If any worsening symptoms, chest tightness or chest pain call 911 or be seen in the emergency room.  Continue Celexa.  Follow-up in the next 3 months and can discuss that medication further at that time.  Please follow-up sooner if any new or worsening mood symptoms.  Call dermatologist to see if they can see you for the cyst. Let me know if they need a referral.    If you have lab work done today you will be contacted with your lab results within the next 2 weeks.  If you have not heard from [place] then please contact us. The fastest way to get your results is to register for My Chart.   IF you received an x-ray today, you will receive an invoice from [facility]. Please contact [facility] at [phone] with questions or concerns regarding your invoice.   IF you received labwork today, you will receive an invoice from [facility]. Please contact [facility] at [phone] with questions or concerns regarding your invoice.   Our billing staff will not be able to assist you with questions regarding bills from these companies.  You will be contacted with the lab results as soon as they are available. The fastest way to get your results is to activate your My Chart account. Instructions are located on the last page of this paperwork. If you have not heard from [place] regarding the results in 2 weeks, please contact this office.

## 2020-09-20 NOTE — Progress Notes (Signed)
Subjective:  Patient ID: Louis Zimmerman, male    DOB: 04/29/1961  Age: 60 y.o. MRN: [id number]  CC:  Chief Complaint  Patient presents with  . Follow-up    On dyspnea on exertion. PT reports still having the SOB. PT states no new symptoms to report and pt states he hasn't noticed any improvement in his condition since last OV.    HPI Louis Zimmerman presents for   Dyspnea on exertion Discussed at his January 21 visit.  6 to 75-month history of dyspnea on exertion at that time.  Some increased cough at that time no wheezing.  Had not had recent PFTs.  Suspicious for possible COPD with smoking history.  no concerning findings on EKG, no chest pain.  Chest x-ray on January 21 indicated subtle densities in the right lower chest that were indeterminate.  Possible overlying soft tissue or nipple shadow, but CT chest recommended to exclude a subtle lung lesion or nodule.  Emphysematous changes were noted.  Pulmonary referral ordered, and CT chest ordered.  No changes in symptoms since last visit. No chest pain. No new cough. No hemoptysis, unexplained wt loss or night sweats.  CT ordered but not yet done.  Has not heard from pulmonary - referral sent to [facility]. Still smoking. Not getting winded with heavy work/sweating at work - notices with yardwork at times - more exertion. Short of breath with few flights of stairs.   No personal hx of heart disease, but mother with 5V CABG at 35yo.   Has restarted celexa since last visit for past few weeks. Feels like depression symptoms have improved - mood better. Less anhedonia - more drive.   At end of visit, noted a bump on outside of L 5th finger - comes and goes for 6 months. Current swelling past 2 weeks, no drainage. Hurts to bump it. 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 Has drained with a needle in past, clear fluid removed.  Similar bump on R 5th finger - not as big.  History of basal cell carcinoma (discussed at  last visit). Saw dermatologist few weeks ago. Did not have this evaluated at that time.   History Patient Active Problem List   Diagnosis Date Noted  . Biceps tendinitis of left upper extremity   . Rotator cuff arthropathy 02/16/2019  . FHx: colon cancer 12/18/2018  . Vasculitis ([place]) 06/26/2018  . AKI (acute kidney injury) ([place]) 06/26/2018  . 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 Attention deficit disorder (ADD) in adult 11/10/2017  . Moderate episode of recurrent major depressive disorder ([place]) 08/31/2017  . 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 Disturbed concentration 08/31/2017  . Tobacco use disorder 08/31/2017  . History of substance abuse ([place]) 02/23/2017  . Depression 02/02/2013   Past Medical History:  Diagnosis Date  . ADD (attention deficit disorder)   . Depression    Past Surgical History:  Procedure Laterality Date  . CHOLECYSTECTOMY    . GALLBLADDER SURGERY  2003  . SHOULDER ARTHROSCOPY Left 02/16/2019   LEFT SHOULDER ARTHROSCOPY LOWER TRAPEZIUS TENDON TRANSFER, BICEPS TENODESIS  . SHOULDER ARTHROSCOPY WITH DEBRIDEMENT AND BICEP TENDON REPAIR Left 02/16/2019   Procedure: LEFT SHOULDER ARTHROSCOPY LOWER TRAPEZIUS TENDON TRANSFER, BICEPS TENODESIS;  Surgeon: Meredith Pel, MD;  Location: [facility];  Service: Orthopedics;  Laterality: Left;  . SKIN BIOPSY  2019   Allergies  Allergen Reactions  . Hydrocodone Anaphylaxis  . Sertraline     sedating   Prior to  Admission medications   Medication Sig Start Date End Date Taking? Authorizing Provider  amphetamine-dextroamphetamine (ADDERALL) 15 MG tablet Take 1 tablet by mouth daily before breakfast. 08/25/20 09/24/20 Yes [name], MD  ASPIRIN 81 PO Take by mouth.   Yes [provider]  citalopram (CELEXA) 40 MG tablet Take 1 tablet (40 mg total) by mouth daily. 04/28/20  Yes Jacelyn Pi, Lilia Argue, MD  gabapentin (NEURONTIN) 300 MG capsule Take 1 capsule (300 mg total) by mouth every morning AND 2  capsules (600 mg total) at bedtime. 04/28/20  Yes Jacelyn Pi, Lilia Argue, MD  meloxicam (MOBIC) 15 MG tablet Take 1 tablet (15 mg total) by mouth daily. 08/20/20  Yes Magnant, Gerrianne Scale, PA-C  amphetamine-dextroamphetamine (ADDERALL) 15 MG tablet Take 1 tablet by mouth daily before breakfast. 08/19/20 09/18/20  [name], MD  amphetamine-dextroamphetamine (ADDERALL) 15 MG tablet Take 1 tablet by mouth daily before breakfast. 08/19/20 09/18/20  [name], MD   Social History   Socioeconomic History  . Marital status: Divorced    Spouse name: Not on file  . Number of children: Not on file  . Years of education: Not on file  . Highest education level: Not on file  Occupational History  . Not on file  Tobacco Use  . Smoking status: Current Every Day Smoker    Packs/day: 2.00    Types: Cigarettes  . Smokeless tobacco: Never Used  Vaping Use  . Vaping Use: Former  Substance and Sexual Activity  . Alcohol use: No  . Drug use: No    Frequency: 7.0 times per week    Types: IV    Comment: history of Rx opoid dependency  . Sexual activity: Not on file  Other Topics Concern  . Not on file  Social History Narrative  . Not on file   Social Determinants of Health   Financial Resource Strain: Not on file  Food Insecurity: Not on file  Transportation Needs: Not on file  Physical Activity: Not on file  Stress: Not on file  Social Connections: Not on file  Intimate Partner Violence: Not on file    Review of Systems   Objective:   Vitals:   09/20/20 1631 09/20/20 1637  BP: (!) 145/80 128/86  Pulse: 99   Temp: (!) 97.4 F (36.3 C)   TempSrc: Temporal   SpO2: 98%   Weight: 189 lb (85.7 kg)   Height: 6' (1.829 m)      Physical Exam Vitals reviewed.  Constitutional:      Appearance: He is well-developed and well-nourished.  HENT:     Head: Normocephalic and atraumatic.  Eyes:     Extraocular Movements: EOM normal.     Pupils: Pupils are equal, round, and  reactive to light.  Neck:     Vascular: No carotid bruit or JVD.  Cardiovascular:     Rate and Rhythm: Normal rate and regular rhythm.     Heart sounds: Normal heart sounds. No murmur heard.   Pulmonary:     Effort: Pulmonary effort is normal.     Breath sounds: Normal breath sounds. No rales.  Musculoskeletal:        General: No edema.  Skin:    General: Skin is warm and dry.     Comments: Left fifth finger, see photo, cystic structure at the distal interphalangeal joint, dorsal aspect.  Transilluminates, cystic-appearing.  No surrounding erythema, no discharge.  Neurological:     Mental Status: He is  alert and oriented to person, place, and time.  Psychiatric:        Mood and Affect: Mood and affect and mood normal.        Behavior: Behavior normal.        34 minutes spent during visit, greater than 50% counseling and assimilation of information, chart review, and discussion of plan.   Assessment & Plan:  GAIUS ISHAQ is a 60 y.o. male . Mucous cyst of finger  -Probable cyst, plans to contact his dermatologist for evaluation and likely aspiration versus excision.  DOE (dyspnea on exertion) - Plan: Ambulatory referral to Cardiology Family history of cardiac disorder in mother - Plan: Ambulatory referral to Cardiology Cigarette nicotine dependence without complication  -CT of chest ordered after few irregularities on chest x-ray.  Referral pending for pulmonary.  Overall stable symptoms.  -We will also refer to cardiology given family history of heart disease, risk factors of smoking, age.  Avoid exertional activities for now with ER precautions given.  Depression  -Improved with citalopram, continue same.  55-month follow-up.  No orders of the defined types were placed in this encounter.  Patient Instructions   I will refer you to cardiology and check into the CT scan and pulmonary referrals.  Avoid exertional activities if possible until evaluated by cardiology  and lung specialist.  If any worsening symptoms, chest tightness or chest pain call 911 or be seen in the emergency room.  Continue Celexa.  Follow-up in the next 3 months and can discuss that medication further at that time.  Please follow-up sooner if any new or worsening mood symptoms.  Call dermatologist to see if they can see you for the cyst. Let me know if they need a referral.    If you have lab work done today you will be contacted with your lab results within the next 2 weeks.  If you have not heard from [place] then please contact us. The fastest way to get your results is to register for My Chart.   IF you received an x-ray today, you will receive an invoice from [facility]. Please contact [facility] at [phone] with questions or concerns regarding your invoice.   IF you received labwork today, you will receive an invoice from [facility]. Please contact [facility] at [phone] with questions or concerns regarding your invoice.   Our billing staff will not be able to assist you with questions regarding bills from these companies.  You will be contacted with the lab results as soon as they are available. The fastest way to get your results is to activate your My Chart account. Instructions are located on the last page of this paperwork. If you have not heard from [place] regarding the results in 2 weeks, please contact this office.         Signed, Merri Ray, MD [facility]

## 2020-09-29 ENCOUNTER — Other Ambulatory Visit: Payer: Self-pay

## 2020-09-29 NOTE — Telephone Encounter (Signed)
Pt. Came into office requesting refill on gabapentin

## 2020-09-29 NOTE — Telephone Encounter (Signed)
Pt was last seen on 09/20/20. This rx was filled by dr Pamella Pert. Please advise on refill.

## 2020-10-01 MED ORDER — GABAPENTIN 300 MG PO CAPS: ORAL_CAPSULE | ORAL | 3 refills | Status: DC

## 2020-10-12 NOTE — Progress Notes (Signed)
Cardiology Office Note:   Date:  10/13/2020  NAME:  Louis Zimmerman    MRN: [id number] DOB:  November 25, 1960   PCP:  [name], MD  Cardiologist:  No primary care provider on file.   Referring MD: [name], MD   Chief Complaint  Patient presents with  . Shortness of Breath   History of Present Illness:   Louis Zimmerman is a 60 y.o. male with a hx of tobacco abuse who is being seen today for the evaluation of SOB at the request of [name], MD. Recent NT-Pro BNP 28. CXR with emphysema changes.  He reports any heavy activity getting profoundly short of breath.  This occurs with yard work or doing any vigorous activity.  Symptoms do not occur at rest.  He reports no chest tightness or chest pain.  He simply reports to be short of breath.  Symptoms occur daily.  They are not getting any better.  He smokes 1 to 2 packs/day.  He has smoked for nearly 50 years.  He has no history of hypertension or CAD.  His mother had a heart attack.  EKG in office demonstrates an incomplete right bundle branch block with no acute ischemic changes or evidence of infarction.  He had an echocardiogram 3 years ago that demonstrated LVH.  He needs a repeat.  EKG does not match this.  BP in office is normal.  No recent lipid profile.  I suspect he will have coronary calcifications but COPD is likely the diagnosis here.  He has emphysematous changes on his chest x-ray.  He is married with 3 children.  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  He denies any lower extremity edema.  Recent BNP value was normal at his primary care physician office.  Kidney function normal as well.  Problem List 1. Tobacco abuse  -1-2 ppd 50 years   Past Medical History: Past Medical History:  Diagnosis Date  . ADD (attention deficit disorder)   . Depression     Past Surgical History: Past Surgical History:  Procedure Laterality Date  . CHOLECYSTECTOMY    . GALLBLADDER SURGERY  2003  . SHOULDER  ARTHROSCOPY Left 02/16/2019   LEFT SHOULDER ARTHROSCOPY LOWER TRAPEZIUS TENDON TRANSFER, BICEPS TENODESIS  . SHOULDER ARTHROSCOPY WITH DEBRIDEMENT AND BICEP TENDON REPAIR Left 02/16/2019   Procedure: LEFT SHOULDER ARTHROSCOPY LOWER TRAPEZIUS TENDON TRANSFER, BICEPS TENODESIS;  Surgeon: Meredith Pel, MD;  Location: [facility];  Service: Orthopedics;  Laterality: Left;  . SKIN BIOPSY  2019    Current Medications: Current Meds  Medication Sig  . albuterol (VENTOLIN HFA) 108 (90 Base) MCG/ACT inhaler Inhale 2 puffs into the lungs every 6 (six) hours as needed for wheezing or shortness of breath.  . amphetamine-dextroamphetamine (ADDERALL) 15 MG tablet Take 1 tablet by mouth daily before breakfast.  . citalopram (CELEXA) 40 MG tablet Take 1 tablet (40 mg total) by mouth daily.  Marland Kitchen gabapentin (NEURONTIN) 300 MG capsule Take 1 capsule (300 mg total) by mouth every morning AND 2 capsules (600 mg total) at bedtime.  Marland Kitchen ivermectin (STROMECTOL) 3 MG TABS tablet Take 18 mg by mouth. Patient takes 6 tablets every two weeks.  . meloxicam (MOBIC) 15 MG tablet Take 1 tablet (15 mg total) by mouth daily.     Allergies:    Hydrocodone and Sertraline   Social History: Social History   Socioeconomic History  . Marital status: Divorced    Spouse name: Not on file  .  Number of children: 3  . Years of education: Not on file  . Highest education level: Not on file  Occupational History  . Not on file  Tobacco Use  . Smoking status: Current Every Day Smoker    Packs/day: 2.00    Years: 50.00    Pack years: 100.00    Types: Cigarettes  . Smokeless tobacco: Never Used  Vaping Use  . Vaping Use: Former  Substance and Sexual Activity  . Alcohol use: No  . Drug use: No    Frequency: 7.0 times per week    Types: IV    Comment: history of Rx opoid dependency  . Sexual activity: Not on file  Other Topics Concern  . Not on file  Social History Narrative  . Not on file   Social Determinants of  Health   Financial Resource Strain: Not on file  Food Insecurity: Not on file  Transportation Needs: Not on file  Physical Activity: Not on file  Stress: Not on file  Social Connections: Not on file     Family History: The patient's family history includes Alcohol abuse in his maternal grandfather and paternal grandfather; Cancer in his mother; Colon cancer in his father; Colon polyps in his sister and sister; Dementia in his mother; Diabetes in his mother; Heart Problems in his mother; Heart disease in his mother; Liver cancer in his father.  ROS:   All other ROS reviewed and negative. Pertinent positives noted in the HPI.     EKGs/Labs/Other Studies Reviewed:   The following studies were personally reviewed by me today:  EKG:  EKG is ordered today.  The ekg ordered today demonstrates normal sinus rhythm heart rate 75 incomplete right bundle branch block noted, and was personally reviewed by me.   Recent Labs: 02/10/2020: ALT 26; BUN 8; Creatinine, Ser 1.00; Potassium 4.4; Sodium 138; TSH 4.340 08/18/2020: Hemoglobin 15.5; NT-Pro BNP 28; Platelets 342   Recent Lipid Panel No results found for: CHOL, TRIG, HDL, CHOLHDL, VLDL, LDLCALC, LDLDIRECT  Physical Exam:   VS:  BP 112/72 (BP Location: Left Arm, Patient Position: Sitting)   Pulse 75   Ht 6' (1.829 m)   Wt 186 lb 3.2 oz (84.5 kg)   SpO2 98%   BMI 25.25 kg/m    Wt Readings from Last 3 Encounters:  10/13/20 186 lb 3.2 oz (84.5 kg)  09/20/20 189 lb (85.7 kg)  08/18/20 191 lb (86.6 kg)    General: Well nourished, well developed, in no acute distress Head: Atraumatic, normal size  Eyes: PEERLA, EOMI  Neck: Supple, no JVD Endocrine: No thryomegaly Cardiac: Normal S1, S2; RRR; no murmurs, rubs, or gallops Lungs: Diminished breath sounds bilaterally Abd: Soft, nontender, no hepatomegaly  Ext: No edema, pulses 2+ Musculoskeletal: No deformities, BUE and BLE strength normal and equal Skin: Warm and dry, no rashes   Neuro:  Alert and oriented to person, place, time, and situation, CNII-XII grossly intact, no focal deficits  Psych: Normal mood and affect   ASSESSMENT:   [name] is a 60 y.o. male who presents for the following: 1. SOB (shortness of breath) on exertion   2. Tobacco abuse     PLAN:   1. SOB (shortness of breath) on exertion -Shortness of breath for 1 year with exertion.  No chest pain or chest tightness.  EKG without ischemic changes.  He has a 50-100-pack-year history.  He has a recent chest x-ray with emphysema.  I would like to check a  TSH has not had one in a while.  I would also like to repeat his echocardiogram.  He had a recent BMP that was normal.  He has no evidence of congestive heart failure examination.  His examination is unremarkable except for diminished breath sounds.  I would also like for him to obtain pulmonary function testing.  Given his recent chest x-ray with emphysema I suspect he will have COPD.  I would also like to start him on albuterol inhaler as needed for shortness of breath.  If he has severe COPD we will have him see a pulmonologist.  If symptoms not improved with treatment of his COPD I would recommend an ischemia evaluation.  At this time symptoms seem to be more consistent with COPD.  2. Tobacco abuse -Smoking cessation counseling provided in office.  Disposition: Return in about 3 months (around 01/13/2021).  Medication Adjustments/Labs and Tests Ordered: Current medicines are reviewed at length with the patient today.  Concerns regarding medicines are outlined above.  Orders Placed This Encounter  Procedures  . TSH  . EKG 12-Lead  . ECHOCARDIOGRAM COMPLETE  . Pulmonary function test   Meds ordered this encounter  Medications  . albuterol (VENTOLIN HFA) 108 (90 Base) MCG/ACT inhaler    Sig: Inhale 2 puffs into the lungs every 6 (six) hours as needed for wheezing or shortness of breath.    Dispense:  8 g    Refill:  2    Patient Instructions   Medication Instructions:  Start Albuterol inhaler as needed for shortness of breath  *If you need a refill on your cardiac medications before your next appointment, please call your pharmacy*   Lab Work: TSH today  If you have labs (blood work) drawn today and your tests are completely normal, you will receive your results only by: Marland Kitchen MyChart Message (if you have MyChart) OR . A paper copy in the mail If you have any lab test that is abnormal or we need to change your treatment, we will call you to review the results.   Testing/Procedures: Echocardiogram - Your physician has requested that you have an echocardiogram. Echocardiography is a painless test that uses sound waves to create images of your heart. It provides your doctor with information about the size and shape of your heart and how well your heart's chambers and valves are working. This procedure takes approximately one hour. There are no restrictions for this procedure. This will be performed at our [facility] location - [address], [address].  Your physician has recommended that you have a pulmonary function test. Pulmonary Function Tests are a group of tests that measure how well air moves in and out of your lungs.    Follow-Up: At [facility], you and your health needs are our priority.  As part of our continuing mission to provide you with exceptional heart care, we have created designated Provider Care Teams.  These Care Teams include your primary Cardiologist (physician) and Advanced Practice Providers (APPs -  Physician Assistants and Nurse Practitioners) who all work together to provide you with the care you need, when you need it.  We recommend signing up for the patient portal called "MyChart".  Sign up information is provided on this After Visit Summary.  MyChart is used to connect with patients for Virtual Visits (Telemedicine).  Patients are able to view lab/test results, encounter notes, upcoming  appointments, etc.  Non-urgent messages can be sent to your provider as well.  To learn more about what you can do with MyChart, go to [email or web address].    Your next appointment:   3 month(s)  The format for your next appointment:   In Person  Provider:   Eleonore Chiquito, MD         Signed, Addison Naegeli. Audie Box, MD, [facility]  [address] [place], [place] [address] [phone]  10/13/2020 9:39 AM

## 2020-10-13 ENCOUNTER — Encounter: Payer: Self-pay | Admitting: Cardiovascular Disease

## 2020-10-13 ENCOUNTER — Other Ambulatory Visit: Payer: Self-pay

## 2020-10-13 ENCOUNTER — Ambulatory Visit: Payer: BC Managed Care – PPO | Admitting: Cardiovascular Disease

## 2020-10-13 VITALS — BP 112/72 | HR 75 | Ht 72.0 in | Wt 186.2 lb

## 2020-10-13 DIAGNOSIS — Z720 Tobacco use: Secondary | ICD-10-CM | POA: Diagnosis not present

## 2020-10-13 DIAGNOSIS — R0602 Shortness of breath: Secondary | ICD-10-CM

## 2020-10-13 LAB — TSH: TSH: 3.46 u[IU]/mL (ref 0.450–4.500)

## 2020-10-13 MED ORDER — ALBUTEROL SULFATE HFA 108 (90 BASE) MCG/ACT IN AERS: 2.0000 | INHALATION_SPRAY | Freq: Four times a day (QID) | RESPIRATORY_TRACT | 2 refills | Status: DC | PRN

## 2020-10-13 NOTE — Patient Instructions (Addendum)
Medication Instructions:  Start Albuterol inhaler as needed for shortness of breath  *If you need a refill on your cardiac medications before your next appointment, please call your pharmacy*   Lab Work: TSH today  If you have labs (blood work) drawn today and your tests are completely normal, you will receive your results only by: Marland Kitchen MyChart Message (if you have MyChart) OR . A paper copy in the mail If you have any lab test that is abnormal or we need to change your treatment, we will call you to review the results.   Testing/Procedures: Echocardiogram - Your physician has requested that you have an echocardiogram. Echocardiography is a painless test that uses sound waves to create images of your heart. It provides your doctor with information about the size and shape of your heart and how well your heart's chambers and valves are working. This procedure takes approximately one hour. There are no restrictions for this procedure. This will be performed at our [facility] location - [address].  Your physician has recommended that you have a pulmonary function test. Pulmonary Function Tests are a group of tests that measure how well air moves in and out of your lungs.    Follow-Up: At [facility], you and your health needs are our priority.  As part of our continuing mission to provide you with exceptional heart care, we have created designated Provider Care Teams.  These Care Teams include your primary Cardiologist (physician) and Advanced Practice Providers (APPs -  Physician Assistants and Nurse Practitioners) who all work together to provide you with the care you need, when you need it.  We recommend signing up for the patient portal called "MyChart".  Sign up information is provided on this After Visit Summary.  MyChart is used to connect with patients for Virtual Visits (Telemedicine).  Patients are able to view lab/test results, encounter notes, upcoming appointments,  etc.  Non-urgent messages can be sent to your provider as well.   To learn more about what you can do with MyChart, go to [email or web address].    Your next appointment:   3 month(s)  The format for your next appointment:   In Person  Provider:   Eleonore Chiquito, MD

## 2020-10-25 ENCOUNTER — Other Ambulatory Visit (HOSPITAL_COMMUNITY)
Admission: RE | Admit: 2020-10-25 | Discharge: 2020-10-25 | Disposition: A | Discharge status: Home / Self Care | Payer: BC Managed Care – PPO | Source: Ambulatory Visit | Attending: Cardiovascular Disease | Admitting: Cardiovascular Disease

## 2020-10-25 DIAGNOSIS — Z20822 Contact with and (suspected) exposure to covid-19: Secondary | ICD-10-CM | POA: Diagnosis not present

## 2020-10-25 DIAGNOSIS — Z01812 Encounter for preprocedural laboratory examination: Secondary | ICD-10-CM | POA: Diagnosis not present

## 2020-10-25 LAB — SARS CORONAVIRUS 2 (TAT 6-24 HRS): SARS Coronavirus 2: NEGATIVE

## 2020-10-27 ENCOUNTER — Ambulatory Visit (HOSPITAL_COMMUNITY)
Admission: RE | Admit: 2020-10-27 | Discharge: 2020-10-27 | Disposition: A | Discharge status: Home / Self Care | Payer: BC Managed Care – PPO | Source: Ambulatory Visit | Attending: Cardiovascular Disease | Admitting: Cardiovascular Disease

## 2020-10-27 ENCOUNTER — Other Ambulatory Visit: Payer: Self-pay

## 2020-10-27 ENCOUNTER — Ambulatory Visit: Payer: BC Managed Care – PPO | Admitting: Surgical

## 2020-10-27 DIAGNOSIS — M7541 Impingement syndrome of right shoulder: Secondary | ICD-10-CM | POA: Diagnosis not present

## 2020-10-27 DIAGNOSIS — R0602 Shortness of breath: Secondary | ICD-10-CM | POA: Diagnosis not present

## 2020-10-27 LAB — PULMONARY FUNCTION TEST
DL/VA % pred: 54 %
DL/VA: 2.3 ml/min/mmHg/L
DLCO unc % pred: 59 %
DLCO unc: 17.74 ml/min/mmHg
FEF 25-75 Post: 1.93 L/sec
FEF 25-75 Pre: 1.88 L/sec
FEF2575-%Change-Post: 2 %
FEF2575-%Pred-Post: 59 %
FEF2575-%Pred-Pre: 57 %
FEV1-%Change-Post: 0 %
FEV1-%Pred-Post: 96 %
FEV1-%Pred-Pre: 96 %
FEV1-Post: 3.81 L
FEV1-Pre: 3.78 L
FEV1FVC-%Change-Post: -1 %
FEV1FVC-%Pred-Pre: 83 %
FEV6-%Change-Post: 1 %
FEV6-%Pred-Post: 112 %
FEV6-%Pred-Pre: 111 %
FEV6-Post: 5.57 L
FEV6-Pre: 5.51 L
FEV6FVC-%Change-Post: 0 %
FEV6FVC-%Pred-Post: 96 %
FEV6FVC-%Pred-Pre: 96 %
FVC-%Change-Post: 1 %
FVC-%Pred-Post: 117 %
FVC-%Pred-Pre: 115 %
FVC-Post: 6.09 L
FVC-Pre: 5.98 L
Post FEV1/FVC ratio: 63 %
Post FEV6/FVC ratio: 92 %
Pre FEV1/FVC ratio: 63 %
Pre FEV6/FVC Ratio: 92 %
RV % pred: 89 %
RV: 2.07 L
TLC % pred: 111 %
TLC: 8.22 L

## 2020-10-27 MED ORDER — ALBUTEROL SULFATE (2.5 MG/3ML) 0.083% IN NEBU
2.5000 mg | INHALATION_SOLUTION | Freq: Once | RESPIRATORY_TRACT | Status: AC
  Administered 2020-10-27: 2.5 mg via RESPIRATORY_TRACT

## 2020-10-28 ENCOUNTER — Encounter: Payer: Self-pay | Admitting: Surgical

## 2020-10-28 NOTE — Progress Notes (Signed)
Office Visit Note   Patient: Louis Zimmerman           Date of Birth: 1960/09/16           MRN: [id number] Visit Date: 10/27/2020 Requested by: [name], MD [address] [place],  [place] [address] PCP: [name], MD  Subjective: Chief Complaint  Patient presents with  . Right Shoulder - Pain  . Other     Wants shoulder injection    HPI: Louis Zimmerman is a 60 y.o. male who presents to the office complaining of right shoulder pain.  Patient continues to complain of right shoulder pain.  Mostly localizes pain to the lateral aspect of the right shoulder with occasional radiation down into the elbow.  He also complains of continued pain and a trigger point in his right shoulder blade.  He notes both pains wake him up at night.  He denies any radicular pain down the entirety arm but he does have numbness and tingling from bilateral carpal tunnel syndrome.  Denies any neck pain.  He had subacromial injection at last office visit that provided some relief from his shoulder pain but now has worn off and he is requesting another injection..                ROS: All systems reviewed are negative as they relate to the chief complaint within the history of present illness.  Patient denies fevers or chills.  Assessment & Plan: Visit Diagnoses:  1. Impingement syndrome of right shoulder     Plan: Patient is a 60 year old male who presents complaining of right shoulder pain.  He has trigger point in his right shoulder blade as well as lateral shoulder pain that has not been resolved with injection at his last visit.  With continuation of symptoms, plan to order MRI arthrogram of the right shoulder for further evaluation.  He does have history of rotator cuff tear on his contralateral shoulder that required lower trapezius tendon transfer.  With description of pain and with his history of the left shoulder difficulty, may be that patient has some rotator cuff damage that he is  compensating for well on exam.  Plan to follow-up after MRI to review results.  Also referred patient to [facility] physical therapy for therapy exercises and dry needling for his trigger point.  Also will work on scheduling patient for right-sided carpal tunnel release as we discussed at his last visit.  Follow-Up Instructions: No follow-ups on file.   Orders:  Orders Placed This Encounter  Procedures  . MR SHOULDER RIGHT W CONTRAST  . Arthrogram   No orders of the defined types were placed in this encounter.     Procedures: No procedures performed   Clinical Data: No additional findings.  Objective: Vital Signs: There were no vitals taken for this visit.  Physical Exam:  Constitutional: Patient appears well-developed HEENT:  Head: Normocephalic Eyes:EOM are normal Neck: Normal range of motion Cardiovascular: Normal rate Pulmonary/chest: Effort normal Neurologic: Patient is alert Skin: Skin is warm Psychiatric: Patient has normal mood and affect  Ortho Exam: Ortho exam demonstrates right shoulder with preserved passive range of motion of the shoulder.  Slight crepitus noted in the anterior aspect of the shoulder with passive motion.  Well-preserved supraspinatus, infraspinatus, subscapularis strength but he does have increased pain with supraspinatus and infraspinatus resistance testing.  Negative Neer and Hawkins impingement sign.  Negative drop arm test.  Positive O'Brien's test.  Negative Hornblower sign.  Mild tenderness over the [facility] joint.  Tenderness over a trigger point at the medial border of the scapula in his right shoulder blade.  No tenderness throughout the cervical spine.  No pain with cervical spine range of motion.  Specialty Comments:  No specialty comments available.  Imaging: No results found.   PMFS History: Patient Active Problem List   Diagnosis Date Noted  . Biceps tendinitis of left upper extremity   . Rotator cuff arthropathy 02/16/2019  .  FHx: colon cancer 12/18/2018  . Vasculitis ([place]) 06/26/2018  . AKI (acute kidney injury) ([place]) 06/26/2018  . 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 Attention deficit disorder (ADD) in adult 11/10/2017  . Moderate episode of recurrent major depressive disorder ([place]) 08/31/2017  . 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 Disturbed concentration 08/31/2017  . Tobacco use disorder 08/31/2017  . History of substance abuse ([place]) 02/23/2017  . Depression 02/02/2013   Past Medical History:  Diagnosis Date  . ADD (attention deficit disorder)   . Depression     Family History  Problem Relation Age of Onset  . Cancer Mother   . Dementia Mother   . Diabetes Mother   . Heart Problems Mother   . Heart disease Mother   . Liver cancer Father   . Colon cancer Father   . Alcohol abuse Maternal Grandfather   . Alcohol abuse Paternal Grandfather   . Colon polyps Sister   . Colon polyps Sister     Past Surgical History:  Procedure Laterality Date  . CHOLECYSTECTOMY    . GALLBLADDER SURGERY  2003  . SHOULDER ARTHROSCOPY Left 02/16/2019   LEFT SHOULDER ARTHROSCOPY LOWER TRAPEZIUS TENDON TRANSFER, BICEPS TENODESIS  . SHOULDER ARTHROSCOPY WITH DEBRIDEMENT AND BICEP TENDON REPAIR Left 02/16/2019   Procedure: LEFT SHOULDER ARTHROSCOPY LOWER TRAPEZIUS TENDON TRANSFER, BICEPS TENODESIS;  Surgeon: Meredith Pel, MD;  Location: [facility];  Service: Orthopedics;  Laterality: Left;  . SKIN BIOPSY  2019   Social History   Occupational History  . Not on file  Tobacco Use  . Smoking status: Current Every Day Smoker    Packs/day: 2.00    Years: 50.00    Pack years: 100.00    Types: Cigarettes  . Smokeless tobacco: Never Used  Vaping Use  . Vaping Use: Former  Substance and Sexual Activity  . Alcohol use: No  . Drug use: No    Frequency: 7.0 times per week    Types: IV    Comment: history of Rx opoid dependency  . Sexual activity: Not on file

## 2020-10-31 ENCOUNTER — Other Ambulatory Visit: Payer: Self-pay

## 2020-10-31 DIAGNOSIS — J449 Chronic obstructive pulmonary disease, unspecified: Secondary | ICD-10-CM

## 2020-11-03 DIAGNOSIS — K1230 Oral mucositis (ulcerative), unspecified: Secondary | ICD-10-CM | POA: Diagnosis not present

## 2020-11-06 ENCOUNTER — Ambulatory Visit
Admission: RE | Admit: 2020-11-06 | Discharge: 2020-11-06 | Disposition: A | Discharge status: Home / Self Care | Payer: BC Managed Care – PPO | Source: Ambulatory Visit | Attending: Family Medicine | Admitting: Family Medicine

## 2020-11-06 ENCOUNTER — Other Ambulatory Visit: Payer: Self-pay

## 2020-11-06 ENCOUNTER — Encounter (HOSPITAL_BASED_OUTPATIENT_CLINIC_OR_DEPARTMENT_OTHER): Payer: Self-pay | Admitting: Orthopedic Surgery

## 2020-11-06 DIAGNOSIS — Z9049 Acquired absence of other specified parts of digestive tract: Secondary | ICD-10-CM | POA: Diagnosis not present

## 2020-11-06 DIAGNOSIS — R0600 Dyspnea, unspecified: Secondary | ICD-10-CM

## 2020-11-06 DIAGNOSIS — J439 Emphysema, unspecified: Secondary | ICD-10-CM | POA: Diagnosis not present

## 2020-11-06 DIAGNOSIS — I2510 Atherosclerotic heart disease of native coronary artery without angina pectoris: Secondary | ICD-10-CM | POA: Diagnosis not present

## 2020-11-06 DIAGNOSIS — R0609 Other forms of dyspnea: Secondary | ICD-10-CM

## 2020-11-06 DIAGNOSIS — F17210 Nicotine dependence, cigarettes, uncomplicated: Secondary | ICD-10-CM

## 2020-11-06 DIAGNOSIS — R9389 Abnormal findings on diagnostic imaging of other specified body structures: Secondary | ICD-10-CM

## 2020-11-06 DIAGNOSIS — R059 Cough, unspecified: Secondary | ICD-10-CM

## 2020-11-09 ENCOUNTER — Other Ambulatory Visit (HOSPITAL_COMMUNITY)
Admission: RE | Admit: 2020-11-09 | Discharge: 2020-11-09 | Disposition: A | Discharge status: Home / Self Care | Payer: BC Managed Care – PPO | Source: Ambulatory Visit | Attending: Orthopedic Surgery | Admitting: Orthopedic Surgery

## 2020-11-09 ENCOUNTER — Encounter (HOSPITAL_BASED_OUTPATIENT_CLINIC_OR_DEPARTMENT_OTHER)
Admission: RE | Admit: 2020-11-09 | Discharge: 2020-11-09 | Disposition: A | Discharge status: Home / Self Care | Payer: BC Managed Care – PPO | Source: Ambulatory Visit | Attending: Orthopedic Surgery | Admitting: Orthopedic Surgery

## 2020-11-09 DIAGNOSIS — Z01810 Encounter for preprocedural cardiovascular examination: Secondary | ICD-10-CM | POA: Diagnosis not present

## 2020-11-09 DIAGNOSIS — Z20822 Contact with and (suspected) exposure to covid-19: Secondary | ICD-10-CM | POA: Insufficient documentation

## 2020-11-09 DIAGNOSIS — Z01812 Encounter for preprocedural laboratory examination: Secondary | ICD-10-CM | POA: Diagnosis not present

## 2020-11-09 LAB — CBC
HCT: 38.3 % — ABNORMAL LOW (ref 39.0–52.0)
Hemoglobin: 13.2 g/dL (ref 13.0–17.0)
MCH: 31.5 pg (ref 26.0–34.0)
MCHC: 34.5 g/dL (ref 30.0–36.0)
MCV: 91.4 fL (ref 80.0–100.0)
Platelets: 304 10*3/uL (ref 150–400)
RBC: 4.19 MIL/uL — ABNORMAL LOW (ref 4.22–5.81)
RDW: 13.5 % (ref 11.5–15.5)
WBC: 9.1 10*3/uL (ref 4.0–10.5)
nRBC: 0 % (ref 0.0–0.2)

## 2020-11-09 LAB — BASIC METABOLIC PANEL
Anion gap: 10 (ref 5–15)
BUN: 11 mg/dL (ref 6–20)
CO2: 22 mmol/L (ref 22–32)
Calcium: 9 mg/dL (ref 8.9–10.3)
Chloride: 104 mmol/L (ref 98–111)
Creatinine, Ser: 1.02 mg/dL (ref 0.61–1.24)
GFR, Estimated: >60 mL/min (ref >60)
Glucose, Bld: 163 mg/dL — ABNORMAL HIGH (ref 70–99)
Potassium: 3.5 mmol/L (ref 3.5–5.1)
Sodium: 136 mmol/L (ref 135–145)

## 2020-11-09 LAB — SARS CORONAVIRUS 2 (TAT 6-24 HRS): SARS Coronavirus 2: NEGATIVE

## 2020-11-09 NOTE — Progress Notes (Addendum)
Sent  Text message reminding pt to go for covid testing today and to come by the surgery center for labwork.

## 2020-11-09 NOTE — Progress Notes (Signed)

## 2020-11-10 ENCOUNTER — Ambulatory Visit (HOSPITAL_COMMUNITY): Payer: BC Managed Care – PPO | Attending: Cardiology

## 2020-11-10 ENCOUNTER — Other Ambulatory Visit: Payer: Self-pay

## 2020-11-10 DIAGNOSIS — R0602 Shortness of breath: Secondary | ICD-10-CM | POA: Diagnosis not present

## 2020-11-10 LAB — ECHOCARDIOGRAM COMPLETE
Area-P 1/2: 3.08 cm2
S' Lateral: 2.8 cm

## 2020-11-13 ENCOUNTER — Ambulatory Visit (HOSPITAL_BASED_OUTPATIENT_CLINIC_OR_DEPARTMENT_OTHER): Payer: BC Managed Care – PPO | Admitting: Anesthesiology

## 2020-11-13 ENCOUNTER — Encounter (HOSPITAL_BASED_OUTPATIENT_CLINIC_OR_DEPARTMENT_OTHER)
Admission: RE | Disposition: A | Discharge status: Home / Self Care | Payer: Self-pay | Source: Ambulatory Visit | Attending: Orthopedic Surgery

## 2020-11-13 ENCOUNTER — Other Ambulatory Visit: Payer: Self-pay

## 2020-11-13 ENCOUNTER — Ambulatory Visit (HOSPITAL_BASED_OUTPATIENT_CLINIC_OR_DEPARTMENT_OTHER)
Admission: RE | Admit: 2020-11-13 | Discharge: 2020-11-13 | Disposition: A | Discharge status: Home / Self Care | Payer: BC Managed Care – PPO | Source: Ambulatory Visit | Attending: Orthopedic Surgery | Admitting: Orthopedic Surgery

## 2020-11-13 ENCOUNTER — Telehealth: Payer: Self-pay | Admitting: Cardiovascular Disease

## 2020-11-13 ENCOUNTER — Encounter (HOSPITAL_BASED_OUTPATIENT_CLINIC_OR_DEPARTMENT_OTHER): Payer: Self-pay | Admitting: Orthopedic Surgery

## 2020-11-13 DIAGNOSIS — Z791 Long term (current) use of non-steroidal anti-inflammatories (NSAID): Secondary | ICD-10-CM | POA: Diagnosis not present

## 2020-11-13 DIAGNOSIS — R7303 Prediabetes: Secondary | ICD-10-CM | POA: Diagnosis not present

## 2020-11-13 DIAGNOSIS — Z79899 Other long term (current) drug therapy: Secondary | ICD-10-CM | POA: Diagnosis not present

## 2020-11-13 DIAGNOSIS — G5601 Carpal tunnel syndrome, right upper limb: Secondary | ICD-10-CM

## 2020-11-13 DIAGNOSIS — Z885 Allergy status to narcotic agent status: Secondary | ICD-10-CM | POA: Insufficient documentation

## 2020-11-13 DIAGNOSIS — J449 Chronic obstructive pulmonary disease, unspecified: Secondary | ICD-10-CM | POA: Diagnosis not present

## 2020-11-13 DIAGNOSIS — F418 Other specified anxiety disorders: Secondary | ICD-10-CM | POA: Diagnosis not present

## 2020-11-13 DIAGNOSIS — F17210 Nicotine dependence, cigarettes, uncomplicated: Secondary | ICD-10-CM | POA: Insufficient documentation

## 2020-11-13 DIAGNOSIS — Z7982 Long term (current) use of aspirin: Secondary | ICD-10-CM | POA: Insufficient documentation

## 2020-11-13 HISTORY — DX: Prediabetes: R73.03

## 2020-11-13 HISTORY — PX: CARPAL TUNNEL RELEASE: SHX101

## 2020-11-13 HISTORY — DX: Unspecified osteoarthritis, unspecified site: M19.90

## 2020-11-13 HISTORY — DX: Chronic obstructive pulmonary disease, unspecified: J44.9

## 2020-11-13 HISTORY — DX: Anxiety disorder, unspecified: F41.9

## 2020-11-13 HISTORY — DX: Dyspnea, unspecified: R06.00

## 2020-11-13 SURGERY — CARPAL TUNNEL RELEASE
Anesthesia: Regional | Site: Wrist | Laterality: Right

## 2020-11-13 MED ORDER — POVIDONE-IODINE 10 % EX SWAB
2.0000 "application " | Freq: Once | CUTANEOUS | Status: AC
  Administered 2020-11-13: 2 via TOPICAL

## 2020-11-13 MED ORDER — OXYCODONE HCL 5 MG PO TABS: 5.0000 mg | ORAL_TABLET | Freq: Once | ORAL | Status: DC | PRN

## 2020-11-13 MED ORDER — PROPOFOL 500 MG/50ML IV EMUL
INTRAVENOUS | Status: DC | PRN
  Administered 2020-11-13: 75 ug/kg/min via INTRAVENOUS

## 2020-11-13 MED ORDER — LIDOCAINE HCL (PF) 0.5 % IJ SOLN
INTRAMUSCULAR | Status: DC | PRN
  Administered 2020-11-13: 30 mL via INTRAVENOUS

## 2020-11-13 MED ORDER — OXYCODONE HCL 5 MG/5ML PO SOLN
5.0000 mg | Freq: Once | ORAL | Status: DC | PRN
Start: 2020-11-13 — End: 2020-11-13

## 2020-11-13 MED ORDER — ONDANSETRON HCL 4 MG/2ML IJ SOLN
INTRAMUSCULAR | Status: DC | PRN
  Administered 2020-11-13: 4 mg via INTRAVENOUS

## 2020-11-13 MED ORDER — MIDAZOLAM HCL 2 MG/2ML IJ SOLN
INTRAMUSCULAR | Status: AC
  Filled 2020-11-13: qty 2

## 2020-11-13 MED ORDER — CEFAZOLIN SODIUM-DEXTROSE 2-4 GM/100ML-% IV SOLN
2.0000 g | INTRAVENOUS | Status: AC
  Administered 2020-11-13: 2 g via INTRAVENOUS

## 2020-11-13 MED ORDER — CEFAZOLIN SODIUM-DEXTROSE 2-4 GM/100ML-% IV SOLN
INTRAVENOUS | Status: AC
  Filled 2020-11-13: qty 100

## 2020-11-13 MED ORDER — LACTATED RINGERS IV SOLN: INTRAVENOUS | Status: DC

## 2020-11-13 MED ORDER — PROMETHAZINE HCL 25 MG/ML IJ SOLN: 6.2500 mg | INTRAMUSCULAR | Status: DC | PRN

## 2020-11-13 MED ORDER — FENTANYL CITRATE (PF) 100 MCG/2ML IJ SOLN
INTRAMUSCULAR | Status: AC
  Filled 2020-11-13: qty 2

## 2020-11-13 MED ORDER — FENTANYL CITRATE (PF) 100 MCG/2ML IJ SOLN
INTRAMUSCULAR | Status: DC | PRN
  Administered 2020-11-13: 100 ug via INTRAVENOUS

## 2020-11-13 MED ORDER — MIDAZOLAM HCL 5 MG/5ML IJ SOLN
INTRAMUSCULAR | Status: DC | PRN
  Administered 2020-11-13: 2 mg via INTRAVENOUS

## 2020-11-13 MED ORDER — BUPIVACAINE HCL (PF) 0.25 % IJ SOLN
INTRAMUSCULAR | Status: DC | PRN
  Administered 2020-11-13: 20 mL

## 2020-11-13 MED ORDER — POVIDONE-IODINE 7.5 % EX SOLN: Freq: Once | CUTANEOUS | Status: DC

## 2020-11-13 MED ORDER — PROPOFOL 10 MG/ML IV BOLUS
INTRAVENOUS | Status: DC | PRN
  Administered 2020-11-13: 50 mg via INTRAVENOUS
  Administered 2020-11-13: 30 mg via INTRAVENOUS

## 2020-11-13 MED ORDER — TRAMADOL HCL 50 MG PO TABS: 50.0000 mg | ORAL_TABLET | Freq: Four times a day (QID) | ORAL | 0 refills | Status: DC | PRN

## 2020-11-13 MED ORDER — HYDROMORPHONE HCL 1 MG/ML IJ SOLN: 0.2500 mg | INTRAMUSCULAR | Status: DC | PRN

## 2020-11-13 MED ORDER — EPHEDRINE SULFATE 50 MG/ML IJ SOLN
INTRAMUSCULAR | Status: DC | PRN
  Administered 2020-11-13: 10 mg via INTRAVENOUS

## 2020-11-13 SURGICAL SUPPLY — 41 items
BLADE SURG 15 STRL LF DISP TIS (BLADE) ×2 IMPLANT
BLADE SURG 15 STRL SS (BLADE) ×4
BNDG CMPR 9X4 STRL LF SNTH (GAUZE/BANDAGES/DRESSINGS) ×1
BNDG ELASTIC 3X5.8 VLCR STR LF (GAUZE/BANDAGES/DRESSINGS) IMPLANT
BNDG ELASTIC 4X5.8 VLCR STR LF (GAUZE/BANDAGES/DRESSINGS) ×2 IMPLANT
BNDG ESMARK 4X9 LF (GAUZE/BANDAGES/DRESSINGS) ×2 IMPLANT
CORD BIPOLAR FORCEPS 12FT (ELECTRODE) ×2 IMPLANT
COVER BACK TABLE 60X90IN (DRAPES) ×2 IMPLANT
COVER MAYO STAND STRL (DRAPES) ×2 IMPLANT
COVER WAND RF STERILE (DRAPES) IMPLANT
DRAPE EXTREMITY T 121X128X90 (DISPOSABLE) ×2 IMPLANT
DRAPE IMP U-DRAPE 54X76 (DRAPES) ×2 IMPLANT
DRAPE INCISE IOBAN 66X45 STRL (DRAPES) IMPLANT
DRAPE SURG 17X23 STRL (DRAPES) ×2 IMPLANT
DRSG PAD ABDOMINAL 8X10 ST (GAUZE/BANDAGES/DRESSINGS) ×2 IMPLANT
DRSG TEGADERM 2-3/8X2-3/4 SM (GAUZE/BANDAGES/DRESSINGS) ×6 IMPLANT
DURAPREP 26ML APPLICATOR (WOUND CARE) ×2 IMPLANT
GAUZE SPONGE 4X4 12PLY STRL (GAUZE/BANDAGES/DRESSINGS) ×4 IMPLANT
GAUZE XEROFORM 1X8 LF (GAUZE/BANDAGES/DRESSINGS) ×2 IMPLANT
GLOVE SURG POLYISO LF SZ8 (GLOVE) ×2 IMPLANT
GLOVE SURG UNDER POLY LF SZ7 (GLOVE) ×2 IMPLANT
GOWN STRL REUS W/ TWL LRG LVL3 (GOWN DISPOSABLE) ×2 IMPLANT
GOWN STRL REUS W/ TWL XL LVL3 (GOWN DISPOSABLE) ×1 IMPLANT
GOWN STRL REUS W/TWL LRG LVL3 (GOWN DISPOSABLE) ×4
GOWN STRL REUS W/TWL XL LVL3 (GOWN DISPOSABLE) ×2
NEEDLE HYPO 25X1 1.5 SAFETY (NEEDLE) ×2 IMPLANT
NS IRRIG 1000ML POUR BTL (IV SOLUTION) ×2 IMPLANT
PACK BASIN DAY SURGERY FS (CUSTOM PROCEDURE TRAY) ×2 IMPLANT
PAD CAST 3X4 CTTN HI CHSV (CAST SUPPLIES) IMPLANT
PAD CAST 4YDX4 CTTN HI CHSV (CAST SUPPLIES) ×1 IMPLANT
PADDING CAST COTTON 3X4 STRL (CAST SUPPLIES)
PADDING CAST COTTON 4X4 STRL (CAST SUPPLIES) ×2
SLING ARM FOAM STRAP LRG (SOFTGOODS) ×2 IMPLANT
SPLINT PLASTER CAST XFAST 4X15 (CAST SUPPLIES) ×10 IMPLANT
SPLINT PLASTER XTRA FAST SET 4 (CAST SUPPLIES) ×10
STOCKINETTE 4X48 STRL (DRAPES) ×2 IMPLANT
SUT ETHILON 3 0 PS 1 (SUTURE) ×4 IMPLANT
SYR BULB EAR ULCER 3OZ GRN STR (SYRINGE) ×2 IMPLANT
SYR CONTROL 10ML LL (SYRINGE) ×2 IMPLANT
TOWEL GREEN STERILE FF (TOWEL DISPOSABLE) ×2 IMPLANT
UNDERPAD 30X36 HEAVY ABSORB (UNDERPADS AND DIAPERS) ×2 IMPLANT

## 2020-11-13 NOTE — Anesthesia Procedure Notes (Signed)
Anesthesia Regional Block: Bier block (IV Regional)   Pre-Anesthetic Checklist: ,, timeout performed, Correct Patient, Correct Site, Correct Laterality, Correct Procedure,, site marked, surgical consent,, at surgeon's request  Laterality: Right     Needles:  Injection technique: Single-shot  Needle Type: Other      Needle Gauge: 20     Additional Needles:   Procedures:,,,,, intact distal pulses, Esmarch exsanguination, single tourniquet utilized,  Narrative:  Start time: 11/13/2020 11:34 AM End time: 11/13/2020 11:34 AM  Performed by: Personally

## 2020-11-13 NOTE — Anesthesia Preprocedure Evaluation (Signed)
Anesthesia Evaluation  Patient identified by MRN, date of birth, ID band Patient awake    Reviewed: Allergy & Precautions, NPO status , Patient's Chart, lab work & pertinent test results  Airway Mallampati: II  TM Distance: >3 FB Neck ROM: Full    Dental  (+) Partial Upper, Partial Lower   Pulmonary COPD, Current Smoker and Patient abstained from smoking.,    breath sounds clear to auscultation       Cardiovascular  Rhythm:Regular Rate:Normal     Neuro/Psych Anxiety Depression    GI/Hepatic   Endo/Other    Renal/GU      Musculoskeletal  (+) Arthritis , Osteoarthritis,    Abdominal   Peds  Hematology   Anesthesia Other Findings   Reproductive/Obstetrics                             Anesthesia Physical  Anesthesia Plan  ASA: III  Anesthesia Plan: Bier Block and Bier Block-LIDOCAINE ONLY   Post-op Pain Management:    Induction: Intravenous  PONV Risk Score and Plan: 0 and Ondansetron and Treatment may vary due to age or medical condition  Airway Management Planned: Simple Face Mask  Additional Equipment:   Intra-op Plan:   Post-operative Plan: Extubation in OR  Informed Consent: I have reviewed the patients History and Physical, chart, labs and discussed the procedure including the risks, benefits and alternatives for the proposed anesthesia with the patient or authorized representative who has indicated his/her understanding and acceptance.     Dental advisory given  Plan Discussed with: CRNA and Anesthesiologist  Anesthesia Plan Comments:         Anesthesia Quick Evaluation

## 2020-11-13 NOTE — Brief Op Note (Signed)
   11/13/2020  12:23 PM  PATIENT:  Louis Zimmerman  60 y.o. male  PRE-OPERATIVE DIAGNOSIS:  right carpal tunnel release  POST-OPERATIVE DIAGNOSIS:  right carpal tunnel release  PROCEDURE:  Procedure(s): RIGHT CARPAL TUNNEL RELEASE  SURGEON:  Surgeon(s): [name], Tonna Corner, MD  ASSISTANT: magnant pa  ANESTHESIA:   regional  EBL: 3 ml    Total I/O In: 300 [I.V.:300] Out: 5 [Blood:5]  BLOOD ADMINISTERED: none  DRAINS: none   LOCAL MEDICATIONS USED:  20 cc marcaine  SPECIMEN:  No Specimen  COUNTS:  YES  TOURNIQUET:   Total Tourniquet Time Documented: Forearm (Right) - 25 minutes Total: Forearm (Right) - 25 minutes   DICTATION: .Other Dictation: Dictation Number [id number]  PLAN OF CARE: Discharge to home after PACU  PATIENT DISPOSITION:  PACU - hemodynamically stable

## 2020-11-13 NOTE — Discharge Instructions (Signed)

## 2020-11-13 NOTE — H&P (Signed)
Louis Zimmerman is an 60 y.o. male.   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  EMG nerve study demonstrates severe carpal tunnel syndrome.  This has been refractory to nonoperative management.  Interfering with his work as well as sleep.  Describes loss of dexterity.  Past Medical History:  Diagnosis Date  . ADD (attention deficit disorder)   . Anxiety   . Arthritis    feet, shoulders  . COPD (chronic obstructive pulmonary disease) ([place])   . Depression   . Dyspnea    with exertion  . Pre-diabetes     Past Surgical History:  Procedure Laterality Date  . CHOLECYSTECTOMY    . GALLBLADDER SURGERY  2003  . SHOULDER ARTHROSCOPY Left 02/16/2019   LEFT SHOULDER ARTHROSCOPY LOWER TRAPEZIUS TENDON TRANSFER, BICEPS TENODESIS  . SHOULDER ARTHROSCOPY WITH DEBRIDEMENT AND BICEP TENDON REPAIR Left 02/16/2019   Procedure: LEFT SHOULDER ARTHROSCOPY LOWER TRAPEZIUS TENDON TRANSFER, BICEPS TENODESIS;  Surgeon: Meredith Pel, MD;  Location: [facility];  Service: Orthopedics;  Laterality: Left;  . SKIN BIOPSY  2019    Family History  Problem Relation Age of Onset  . Cancer Mother   . Dementia Mother   . Diabetes Mother   . Heart Problems Mother   . Heart disease Mother   . Liver cancer Father   . Colon cancer Father   . Alcohol abuse Maternal Grandfather   . Alcohol abuse Paternal Grandfather   . Colon polyps Sister   . Colon polyps Sister    Social History:  reports that he has been smoking cigarettes. He has a 50.00 pack-year smoking history. He has never used smokeless tobacco. He reports that he does not drink alcohol and does not use drugs.  Allergies:  Allergies  Allergen Reactions  . Hydrocodone Anaphylaxis  . Sertraline     sedating    Medications Prior to Admission  Medication Sig Dispense Refill  . amphetamine-dextroamphetamine  (ADDERALL) 15 MG tablet Take 1 tablet by mouth daily before breakfast. 30 tablet 0  . aspirin EC 81 MG tablet Take 81 mg by mouth daily. Swallow whole.    . citalopram (CELEXA) 40 MG tablet Take 1 tablet (40 mg total) by mouth daily. 30 tablet 5  . gabapentin (NEURONTIN) 300 MG capsule Take 1 capsule (300 mg total) by mouth every morning AND 2 capsules (600 mg total) at bedtime. 90 capsule 3  . ivermectin (STROMECTOL) 3 MG TABS tablet Take 18 mg by mouth. Patient takes 6 tablets every two weeks.    . meloxicam (MOBIC) 15 MG tablet Take 1 tablet (15 mg total) by mouth daily. 30 tablet 0  . albuterol (VENTOLIN HFA) 108 (90 Base) MCG/ACT inhaler Inhale 2 puffs into the lungs every 6 (six) hours as needed for wheezing or shortness of breath. 8 g 2    No results found for this or any previous visit (from the past 48 hour(s)). No results found.  Review of Systems  Musculoskeletal: Positive for arthralgias.  All other systems reviewed and are negative.   Blood pressure (!) 141/90, pulse 64, temperature 98 F (36.7 C), temperature source Oral, resp. rate 16, height 6' (1.829 m), weight 83.5 kg, SpO2 100 %. Physical Exam Vitals reviewed.  HENT:     Head: Normocephalic.     Nose: Nose normal.     Mouth/Throat:     Mouth: Mucous  membranes are moist.  Eyes:     Pupils: Pupils are equal, round, and reactive to light.  Cardiovascular:     Rate and Rhythm: Normal rate.     Pulses: Normal pulses.  Pulmonary:     Effort: Pulmonary effort is normal.  Abdominal:     General: Abdomen is flat.  Musculoskeletal:     Cervical back: Normal range of motion.  Skin:    General: Skin is warm.     Capillary Refill: Capillary refill takes less than 2 seconds.  Neurological:     General: No focal deficit present.     Mental Status: He is alert.  Psychiatric:        Mood and Affect: Mood normal.   Examination of the right hand demonstrates mild abductor pollicis brevis wasting with good grip strength.   Radial pulses intact.  Negative Tinel's cubital tunnel in the elbow.  No subluxation of the ulnar nerve.  Neck range of motion reasonable.  Assessment/Plan Impression is severe right carpal tunnel syndrome refractory to nonoperative management.  Patient has failed night splinting as well as medication activity modification.  He reports daily pain.  EMG nerve studies consistent with severe carpal tunnel syndrome.  Plan is open carpal tunnel release.  Risk benefits are discussed include not limited to infection nerve vessel damage incomplete restoration of full hand function as well as potential for persistent numbness and tingling in the hand.  He will have decreased grip strength for about 3 months after the procedure.  All questions answered  Anderson Malta, MD 11/13/2020, 10:45 AM

## 2020-11-13 NOTE — Anesthesia Postprocedure Evaluation (Signed)
Anesthesia Post Note  Patient: Louis Zimmerman  Procedure(s) Performed: RIGHT CARPAL TUNNEL RELEASE (Right Wrist)     Patient location during evaluation: PACU Anesthesia Type: Bier Block Level of consciousness: awake and alert Pain management: pain level controlled Vital Signs Assessment: post-procedure vital signs reviewed and stable Respiratory status: spontaneous breathing, nonlabored ventilation and respiratory function stable Cardiovascular status: blood pressure returned to baseline and stable Postop Assessment: no apparent nausea or vomiting Anesthetic complications: no   No complications documented.  Last Vitals:  Vitals:   11/13/20 1245 11/13/20 1307  BP: 129/88 128/87  Pulse: (!) 58 64  Resp: (!) 7 18  Temp:  36.7 C  SpO2: 98% 100%    Last Pain:  Vitals:   11/13/20 1307  TempSrc:   PainSc: 1                  Lynda Rainwater

## 2020-11-13 NOTE — Telephone Encounter (Signed)
Left message for patient regarding the Friday 12/15/20 1:30 pm pulmonary consult with Dr. Darlis Loan Ellison--[id number]--phone [phone].  Will mail information to patient and it is available in My Chart.  Requested patient call with questions or concerns.

## 2020-11-13 NOTE — Transfer of Care (Signed)
Immediate Anesthesia Transfer of Care Note  Patient: Louis Zimmerman  Procedure(s) Performed: RIGHT CARPAL TUNNEL RELEASE (Right Wrist)  Patient Location: PACU  Anesthesia Type:MAC and Bier block  Level of Consciousness: awake, alert  and oriented  Airway & Oxygen Therapy: Patient Spontanous Breathing  Post-op Assessment: Report given to RN and Post -op Vital signs reviewed and stable  Post vital signs: Reviewed and stable  Last Vitals:  Vitals Value Taken Time  BP    Temp    Pulse 65 11/13/20 1225  Resp 9 11/13/20 1225  SpO2 99 % 11/13/20 1225  Vitals shown include unvalidated device data.  Last Pain:  Vitals:   11/13/20 1018  TempSrc: Oral  PainSc: 0-No pain      Patients Stated Pain Goal: 3 (35/82/51 8984)  Complications: No complications documented.

## 2020-11-14 ENCOUNTER — Encounter (HOSPITAL_BASED_OUTPATIENT_CLINIC_OR_DEPARTMENT_OTHER): Payer: Self-pay | Admitting: Orthopedic Surgery

## 2020-11-14 NOTE — Op Note (Signed)
NAME: Louis Zimmerman, Louis Zimmerman MEDICAL RECORD NO: [id number] ACCOUNT NO: [id number] DATE OF BIRTH: October 13, 1960 FACILITY: [facility] LOCATION: [facility] PHYSICIAN: Yetta Barre. [name], MD  Operative Report   DATE OF PROCEDURE: 11/13/2020  PREOPERATIVE DIAGNOSIS:  Right hand carpal tunnel syndrome.  POSTOPERATIVE DIAGNOSIS:  Right hand carpal tunnel syndrome.  PROCEDURE:  Right carpal tunnel release.  SURGEON:  Meredith Pel, MD  ASSISTANT:  Annie Main, PA  INDICATIONS:  The patient is a 60 year old patient with right hand carpal tunnel syndrome who presents for operative management after explanation of risks and benefits.  DESCRIPTION OF PROCEDURE:  The patient was brought to the operating room where IV regional anesthetic was induced.  Preoperative antibiotics were administered.  Timeout was called.  Right hand was prescrubbed with alcohol and Betadine, allowed to air  dry, prepped with DuraPrep solution, and draped in a sterile manner.  With IV regional anesthetic in place, the patient was placed in a lead hand positioner.  Intersection between Kaplan's cardinal line and the radial border of the fourth finger was  identified. That was taken down to the wrist flexion crease.  Skin and subcutaneous tissue were sharply divided.  Bleeding points encountered controlled using bipolar electrocautery.  Palmar fascia was encountered and divided.  Self-retaining retractor  was placed.  Transverse carpal ligament was then identified and divided along its mid section for 2-3 mm.  Right angle retractor was then placed between the transverse carpal ligament and the median nerve.  The transverse carpal ligament was then  released under direct visualization distally to the neurovascular bundle and proximally to the forearm fascia under direct visualization.  Motor branch intact.  No masses or cysts present within the carpal canal.  Thorough irrigation was performed.  Skin  edges were anesthetized using 20 mL of  Marcaine without epinephrine.  Tourniquet was released.  Bleeding points encountered controlled using bipolar electrocautery.  Thorough irrigation again performed.  Incision was closed using 3-0 nylon suture.   Impervious dressing placed along with a volar wrist splint.  The patient tolerated the procedure well without immediate complications, was transferred to the recovery room in stable condition.  Luke's assistance was required at all times for retraction, opening, closing, mobilization of tissue.  His assistance was a medical necessity.   ROH D: 11/13/2020 12:26:32 pm T: 11/13/2020 9:51:00 pm  JOB: [id number]/ [id number]

## 2020-11-17 ENCOUNTER — Ambulatory Visit (AMBULATORY_SURGERY_CENTER): Payer: Self-pay

## 2020-11-17 ENCOUNTER — Other Ambulatory Visit: Payer: Self-pay

## 2020-11-17 VITALS — Ht 72.0 in | Wt 184.0 lb

## 2020-11-17 DIAGNOSIS — Z800 Family history of malignant neoplasm of digestive organs: Secondary | ICD-10-CM

## 2020-11-17 DIAGNOSIS — Z1211 Encounter for screening for malignant neoplasm of colon: Secondary | ICD-10-CM

## 2020-11-17 NOTE — Progress Notes (Signed)
No allergies to soy or egg Pt is not on blood thinners or diet pills Denies issues with sedation/intubation Denies atrial flutter/fib Denies constipation   Emmi instructions given to pt  Pt is aware of Covid safety and care partner requirements.  

## 2020-11-19 DIAGNOSIS — G5601 Carpal tunnel syndrome, right upper limb: Secondary | ICD-10-CM

## 2020-11-20 DIAGNOSIS — M25511 Pain in right shoulder: Secondary | ICD-10-CM | POA: Diagnosis not present

## 2020-11-20 DIAGNOSIS — M25512 Pain in left shoulder: Secondary | ICD-10-CM | POA: Diagnosis not present

## 2020-11-20 DIAGNOSIS — M542 Cervicalgia: Secondary | ICD-10-CM | POA: Diagnosis not present

## 2020-11-22 ENCOUNTER — Ambulatory Visit (INDEPENDENT_AMBULATORY_CARE_PROVIDER_SITE_OTHER): Payer: BC Managed Care – PPO | Admitting: Orthopedic Surgery

## 2020-11-22 ENCOUNTER — Other Ambulatory Visit: Payer: Self-pay

## 2020-11-22 DIAGNOSIS — G5603 Carpal tunnel syndrome, bilateral upper limbs: Secondary | ICD-10-CM

## 2020-11-23 ENCOUNTER — Encounter: Payer: Self-pay | Admitting: Orthopedic Surgery

## 2020-11-23 NOTE — Progress Notes (Signed)
Post-Op Visit Note   Patient: Louis Zimmerman           Date of Birth: 12-28-1960           MRN: [id number] Visit Date: 11/22/2020 PCP: Louis Agreste, MD   Assessment & Plan:  Chief Complaint:  Chief Complaint  Patient presents with  . Other    Rt CTR 11/13/20   Visit Diagnoses:  1. Bilateral carpal tunnel syndrome     Plan: Louis Zimmerman is a patient is now about 9 days out right carpal tunnel release.  He has been doing well.  MRI scan pending on the right shoulder 12/01/2020.  He also describes right foot pain and great toe pain.  Sounds like it could have been a gout attack starting about 10 days ago which is gradually improved.  He also describes neuropathy symptoms affecting the dorsal and plantar aspect of the right foot distal half.  Regarding his hand he is doing well from his carpal tunnel surgery.  Numbness and tingling has improved.  He has been very active.  He has been using a splint.  Plan is to let the sutures stay in until Friday and then we will discontinue them and apply Steri-Strips with benzoin.  We can work-up his foot with referral to Louis Zimmerman after we see him back for his MRI scan on the right shoulder.  He has been in therapy for the shoulder with dry needling not helping.  Follow-Up Instructions: No follow-ups on file.   Orders:  No orders of the defined types were placed in this encounter.  No orders of the defined types were placed in this encounter.   Imaging: No results found.  PMFS History: Patient Active Problem List   Diagnosis Date Noted  . Carpal tunnel syndrome, right upper limb   . Biceps tendinitis of left upper extremity   . Rotator cuff arthropathy 02/16/2019  . FHx: colon cancer 12/18/2018  . Vasculitis ([place]) 06/26/2018  . AKI (acute kidney injury) ([place]) 06/26/2018  . 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 Attention deficit disorder (ADD) in adult 11/10/2017  . Moderate episode of recurrent major depressive disorder ([place]) 08/31/2017  .  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 Disturbed concentration 08/31/2017  . Tobacco use disorder 08/31/2017  . History of substance abuse ([place]) 02/23/2017  . Depression 02/02/2013   Past Medical History:  Diagnosis Date  . ADD (attention deficit disorder)   . Anxiety   . Arthritis    feet, shoulders  . Cancer ([place])    skin cancer  . COPD (chronic obstructive pulmonary disease) ([place])   . Depression   . Dyspnea    with exertion  . Neuromuscular disorder ([name])    carpal tunnel  . Pre-diabetes     Family History  Problem Relation Age of Onset  . Cancer Mother   . Dementia Mother   . Diabetes Mother   . Heart Problems Mother   . Heart disease Mother   . Liver cancer Father   . Colon cancer Father   . Alcohol abuse Maternal Grandfather   . Alcohol abuse Paternal Grandfather   . Colon polyps Sister   . Colon polyps Sister   . Esophageal cancer Neg Hx   . Stomach cancer Neg Hx   . Rectal cancer Neg Hx     Past Surgical History:  Procedure Laterality Date  . CARPAL TUNNEL RELEASE Right 11/13/2020   Procedure: RIGHT CARPAL TUNNEL RELEASE;  Surgeon: Meredith Pel,  MD;  Location: [facility];  Service: Orthopedics;  Laterality: Right;  . CHOLECYSTECTOMY    . COLONOSCOPY     2000  . GALLBLADDER SURGERY  2003  . SHOULDER ARTHROSCOPY Left 02/16/2019   LEFT SHOULDER ARTHROSCOPY LOWER TRAPEZIUS TENDON TRANSFER, BICEPS TENODESIS  . SHOULDER ARTHROSCOPY WITH DEBRIDEMENT AND BICEP TENDON REPAIR Left 02/16/2019   Procedure: LEFT SHOULDER ARTHROSCOPY LOWER TRAPEZIUS TENDON TRANSFER, BICEPS TENODESIS;  Surgeon: Meredith Pel, MD;  Location: [facility];  Service: Orthopedics;  Laterality: Left;  . SKIN BIOPSY  2019   Social History   Occupational History  . Not on file  Tobacco Use  . Smoking status: Current Every Day Smoker    Packs/day: 1.00    Years: 50.00    Pack years: 50.00    Types: Cigarettes  . Smokeless tobacco: Never Used  Vaping Use  .  Vaping Use: Former  Substance and Sexual Activity  . Alcohol use: No  . Drug use: No    Frequency: 7.0 times per week    Types: IV    Comment: history of Rx opoid dependency  . Sexual activity: Not on file

## 2020-11-24 ENCOUNTER — Encounter: Payer: Self-pay | Admitting: Family Medicine

## 2020-11-24 ENCOUNTER — Ambulatory Visit (INDEPENDENT_AMBULATORY_CARE_PROVIDER_SITE_OTHER): Payer: BC Managed Care – PPO | Admitting: Family Medicine

## 2020-11-24 ENCOUNTER — Ambulatory Visit (INDEPENDENT_AMBULATORY_CARE_PROVIDER_SITE_OTHER): Payer: BC Managed Care – PPO | Admitting: Orthopedic Surgery

## 2020-11-24 ENCOUNTER — Other Ambulatory Visit: Payer: Self-pay

## 2020-11-24 ENCOUNTER — Encounter: Payer: Self-pay | Admitting: Orthopedic Surgery

## 2020-11-24 VITALS — BP 110/66 | HR 88 | Temp 98.2°F | Resp 16 | Ht 72.0 in | Wt 184.0 lb

## 2020-11-24 DIAGNOSIS — Z0000 Encounter for general adult medical examination without abnormal findings: Secondary | ICD-10-CM

## 2020-11-24 DIAGNOSIS — Z13220 Encounter for screening for lipoid disorders: Secondary | ICD-10-CM | POA: Diagnosis not present

## 2020-11-24 DIAGNOSIS — J439 Emphysema, unspecified: Secondary | ICD-10-CM

## 2020-11-24 DIAGNOSIS — R7303 Prediabetes: Secondary | ICD-10-CM | POA: Diagnosis not present

## 2020-11-24 DIAGNOSIS — Z125 Encounter for screening for malignant neoplasm of prostate: Secondary | ICD-10-CM

## 2020-11-24 DIAGNOSIS — F988 Other specified behavioral and emotional disorders with onset usually occurring in childhood and adolescence: Secondary | ICD-10-CM

## 2020-11-24 DIAGNOSIS — F331 Major depressive disorder, recurrent, moderate: Secondary | ICD-10-CM

## 2020-11-24 DIAGNOSIS — G5603 Carpal tunnel syndrome, bilateral upper limbs: Secondary | ICD-10-CM

## 2020-11-24 DIAGNOSIS — Z8042 Family history of malignant neoplasm of prostate: Secondary | ICD-10-CM

## 2020-11-24 DIAGNOSIS — R202 Paresthesia of skin: Secondary | ICD-10-CM

## 2020-11-24 DIAGNOSIS — F17210 Nicotine dependence, cigarettes, uncomplicated: Secondary | ICD-10-CM

## 2020-11-24 LAB — COMPREHENSIVE METABOLIC PANEL
ALT: 23 U/L (ref 0–53)
AST: 19 U/L (ref 0–37)
Albumin: 4.2 g/dL (ref 3.5–5.2)
Alkaline Phosphatase: 69 U/L (ref 39–117)
BUN: 15 mg/dL (ref 6–23)
CO2: 25 mEq/L (ref 19–32)
Calcium: 9.5 mg/dL (ref 8.4–10.5)
Chloride: 104 mEq/L (ref 96–112)
Creatinine, Ser: 1.12 mg/dL (ref 0.40–1.50)
GFR: 72.15 mL/min (ref >60.00)
Glucose, Bld: 86 mg/dL (ref 70–99)
Potassium: 3.4 mEq/L — ABNORMAL LOW (ref 3.5–5.1)
Sodium: 138 mEq/L (ref 135–145)
Total Bilirubin: 0.4 mg/dL (ref 0.2–1.2)
Total Protein: 6.3 g/dL (ref 6.0–8.3)

## 2020-11-24 LAB — LIPID PANEL
Cholesterol: 200 mg/dL (ref 0–200)
HDL: 30.8 mg/dL — ABNORMAL LOW (ref >39.00)
LDL Cholesterol: 137 mg/dL — ABNORMAL HIGH (ref 0–99)
NonHDL: 169.59
Total CHOL/HDL Ratio: 7
Triglycerides: 163 mg/dL — ABNORMAL HIGH (ref 0.0–149.0)
VLDL: 32.6 mg/dL (ref 0.0–40.0)

## 2020-11-24 LAB — PSA: PSA: 0.54 ng/mL (ref 0.10–4.00)

## 2020-11-24 LAB — HEMOGLOBIN A1C: Hgb A1c MFr Bld: 5.8 % (ref 4.6–6.5)

## 2020-11-24 MED ORDER — CITALOPRAM HYDROBROMIDE 40 MG PO TABS: 40.0000 mg | ORAL_TABLET | Freq: Every day | ORAL | 1 refills | Status: DC

## 2020-11-24 MED ORDER — AMPHETAMINE-DEXTROAMPHETAMINE 15 MG PO TABS: 7.5000 mg | ORAL_TABLET | Freq: Two times a day (BID) | ORAL | 0 refills | Status: DC

## 2020-11-24 MED ORDER — GABAPENTIN 300 MG PO CAPS: ORAL_CAPSULE | ORAL | 1 refills | Status: DC

## 2020-11-24 NOTE — Progress Notes (Signed)
Post-Op Visit Note   Patient: Louis Zimmerman           Date of Birth: Aug 07, 1960           MRN: [id number] Visit Date: 11/24/2020 PCP: [name], MD   Assessment & Plan:  Chief Complaint:  Chief Complaint  Patient presents with  . Other    Suture removal for right CTR on 11/13/20   Visit Diagnoses:  1. Bilateral carpal tunnel syndrome     Plan: Sutures removed today.  Plan for 4-week return.  Cautioned him against doing too much with the hand last office visit  Follow-Up Instructions: Return in about 4 weeks (around 12/22/2020).   Orders:  No orders of the defined types were placed in this encounter.  No orders of the defined types were placed in this encounter.   Imaging: No results found.  PMFS History: Patient Active Problem List   Diagnosis Date Noted  . Carpal tunnel syndrome, right upper limb   . Biceps tendinitis of left upper extremity   . Rotator cuff arthropathy 02/16/2019  . FHx: colon cancer 12/18/2018  . Vasculitis ([place]) 06/26/2018  . AKI (acute kidney injury) ([place]) 06/26/2018  . 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 Attention deficit disorder (ADD) in adult 11/10/2017  . Moderate episode of recurrent major depressive disorder ([place]) 08/31/2017  . 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 Disturbed concentration 08/31/2017  . Tobacco use disorder 08/31/2017  . History of substance abuse ([place]) 02/23/2017  . Depression 02/02/2013   Past Medical History:  Diagnosis Date  . ADD (attention deficit disorder)   . Anxiety   . Arthritis    feet, shoulders  . Cancer ([place])    skin cancer  . COPD (chronic obstructive pulmonary disease) ([place])   . Depression   . Dyspnea    with exertion  . Neuromuscular disorder ([name])    carpal tunnel  . Pre-diabetes     Family History  Problem Relation Age of Onset  . Cancer Mother   . Dementia Mother   . Diabetes Mother   . Heart Problems Mother   . Heart disease Mother   . Liver cancer Father   .  Colon cancer Father   . Alcohol abuse Maternal Grandfather   . Alcohol abuse Paternal Grandfather   . Colon polyps Sister   . Colon polyps Sister   . Esophageal cancer Neg Hx   . Stomach cancer Neg Hx   . Rectal cancer Neg Hx     Past Surgical History:  Procedure Laterality Date  . CARPAL TUNNEL RELEASE Right 11/13/2020   Procedure: RIGHT CARPAL TUNNEL RELEASE;  Surgeon: Meredith Pel, MD;  Location: [facility];  Service: Orthopedics;  Laterality: Right;  . CHOLECYSTECTOMY    . COLONOSCOPY     2000  . GALLBLADDER SURGERY  2003  . SHOULDER ARTHROSCOPY Left 02/16/2019   LEFT SHOULDER ARTHROSCOPY LOWER TRAPEZIUS TENDON TRANSFER, BICEPS TENODESIS  . SHOULDER ARTHROSCOPY WITH DEBRIDEMENT AND BICEP TENDON REPAIR Left 02/16/2019   Procedure: LEFT SHOULDER ARTHROSCOPY LOWER TRAPEZIUS TENDON TRANSFER, BICEPS TENODESIS;  Surgeon: Meredith Pel, MD;  Location: [facility];  Service: Orthopedics;  Laterality: Left;  . SKIN BIOPSY  2019   Social History   Occupational History  . Not on file  Tobacco Use  . Smoking status: Current Every Day Smoker    Packs/day: 1.00    Years: 50.00    Pack years: 50.00    Types: Cigarettes  .  Smokeless tobacco: Never Used  Vaping Use  . Vaping Use: Former  Substance and Sexual Activity  . Alcohol use: No  . Drug use: No    Frequency: 7.0 times per week    Types: IV    Comment: history of Rx opoid dependency  . Sexual activity: Not Currently

## 2020-11-24 NOTE — Progress Notes (Signed)
Subjective:  Patient ID: Louis Zimmerman, male    DOB: 1960/08/15  Age: 60 y.o. MRN: [id number]  CC:  Chief Complaint  Patient presents with  . Annual Exam    Pt here for physical, pt is also in need of refills on his medications. Pt was Rx Celexa by [place] and is now due for refill GAD7 score of 7. Pt also concerned he is going through withdrawals by not taking adderall on Friday Saturday Sunday and would like to discuss.     HPI Louis Zimmerman presents for   Physical exam, med refills, and review of chronic problems.  Dyspnea on exertion Discussed February 23.Suspicious for COPD, was referred to cardiology. Cardiology eval March 18.  No EKG ischemic changes.  No evidence of CHF.  Albuterol as needed for dyspnea, echo and pulmonary function testing ordered.Minimal COPD on PFTs with moderate diffusion defect.  Referred to Dr. Loanne Drilling with pulmonary, with appointment May 20.Marland Kitchen  Echo with EF 60 to 65%.  No wall motion abnormalities.  Grade 1 diastolic dysfunction.  Right ventricular systolic function normal.  CT chest 4/11: IMPRESSION: 1. Moderately severe emphysematous lung disease bilaterally affecting mostly the upper lobes. No evidence of pulmonary fibrosis or bronchiectasis. 2. No pulmonary nodules or masses identified. 3. Coronary atherosclerosis with calcified plaque in the distribution of the LAD. 4. Emphysema.  No change in dyspnea. Albuterol inhaler on occasion. Min change.   Nicotine addiction: Cigarettes, 3/4-1 ppd (prior 2ppd).  Researching programs to work for smoking cessation. Has cut back on smoking.    Attention deficit disorder Last discussed with Dr. Pamella Pert in July.  Well-controlled at that time.  Urine drug screen consistent on 02/10/2020.  Treated with Adderall 15 mg daily, last prescription 08/25/2020 for #30 per chart.  PDMP review unavailable at this time. Takes 1/2 pill twice per day M-Th, off on weekends. Night sweats on weekends off meds. Would  like to remain on same dosing, no changes requested and meds working.   Has been followed by orthopedics for impingement syndrome of right shoulder and bilateral carpal tunnel syndrome.  Carpal tunnel release April 18.  Dr. [name]. Takes mobic daily. Has MRI planned for shoulder.  On gabapentin for numbness in toes and various pains. Feels like current dose working ok.    Depression: Started on Celexa by previous primary provider, Dr. Pamella Pert with improvement of anhedonia.  Discussion of multiple previous meds noted on her office visit August 13.  Has been off medication in January visit, restarted, and has been continued on Celexa 40 mg. Denies SI, feels lie depression is controlled. No new side effects.   GAD 7 : Generalized Anxiety Score 11/24/2020  Nervous, Anxious, on Edge 1  Control/stop worrying 1  Worry too much - different things 2  Trouble relaxing 0  Restless 0  Easily annoyed or irritable 1  Afraid - awful might happen 0  Total GAD 7 Score 5      Depression screen [facility] 2/9 11/24/2020 11/24/2020 09/20/2020 08/18/2020 04/28/2020  Decreased Interest 0 0 0 0 0  Down, Depressed, Hopeless 0 0 0 0 1  PHQ - 2 Score 0 0 0 0 1  Altered sleeping 0 - - - 0  Tired, decreased energy 0 - - - 0  Change in appetite 0 - - - 0  Feeling bad or failure about yourself  0 - - - 0  Trouble concentrating 0 - - - 0  Moving slowly or fidgety/restless 0 - - -  0  Suicidal thoughts 0 - - - 0  PHQ-9 Score 0 - - - 1  Difficult doing work/chores - - - - -  Some recent data might be hidden   Cancer screening: Colon cancer screening: colonoscopy planned May 6th.   No results found for: PSA1, PSA  father with his of prostate CA at 64yo. The natural history of prostate cancer and ongoing controversy regarding screening and potential treatment outcomes of prostate cancer has been discussed with the patient. The meaning of a false positive PSA and a false negative PSA has been discussed. He indicates  understanding of the limitations of this screening test and wishes to proceed with screening PSA testing. Declines DRE at this time.   Immunization History  Administered Date(s) Administered  . Tdap 06/07/2018  shingrix: declines.  pneumonia vaccine: declines at this time. Discussed risks with smoking and emphysema.  covid vaccine - declines.    Hearing Screening   125Hz  250Hz  500Hz  1000Hz  2000Hz  3000Hz  4000Hz  6000Hz  8000Hz   Right ear:           Left ear:             Visual Acuity Screening   Right eye Left eye Both eyes  Without correction:     With correction: 20/25 20/20 20/20-1  wears glasses.  Last optho visit 2 years ago. Recommended appt.   Dental: no recent visit.   Exercise: walking, minimal and physical work. Limited by foot issues. Considering   Prediabetes:  Drinks soda - trying to drink more water.  Lab Results  Component Value Date   HGBA1C 5.9 (H) 08/18/2020   Wt Readings from Last 3 Encounters:  11/24/20 184 lb (83.5 kg)  11/17/20 184 lb (83.5 kg)  11/13/20 184 lb 1.4 oz (83.5 kg)     History Patient Active Problem List   Diagnosis Date Noted  . Carpal tunnel syndrome, right upper limb   . Biceps tendinitis of left upper extremity   . Rotator cuff arthropathy 02/16/2019  . FHx: colon cancer 12/18/2018  . Vasculitis ([place]) 06/26/2018  . AKI (acute kidney injury) ([place]) 06/26/2018  . 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 Attention deficit disorder (ADD) in adult 11/10/2017  . Moderate episode of recurrent major depressive disorder ([place]) 08/31/2017  . 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 Disturbed concentration 08/31/2017  . Tobacco use disorder 08/31/2017  . History of substance abuse ([place]) 02/23/2017  . Depression 02/02/2013   Past Medical History:  Diagnosis Date  . ADD (attention deficit disorder)   . Anxiety   . Arthritis    feet, shoulders  . Cancer ([place])    skin cancer  . COPD (chronic obstructive pulmonary disease) ([employer])   .  Depression   . Dyspnea    with exertion  . Neuromuscular disorder ([name])    carpal tunnel  . Pre-diabetes    Past Surgical History:  Procedure Laterality Date  . CARPAL TUNNEL RELEASE Right 11/13/2020   Procedure: RIGHT CARPAL TUNNEL RELEASE;  Surgeon: Meredith Pel, MD;  Location: [place];  Service: Orthopedics;  Laterality: Right;  . CHOLECYSTECTOMY    . COLONOSCOPY     2000  . GALLBLADDER SURGERY  2003  . SHOULDER ARTHROSCOPY Left 02/16/2019   LEFT SHOULDER ARTHROSCOPY LOWER TRAPEZIUS TENDON TRANSFER, BICEPS TENODESIS  . SHOULDER ARTHROSCOPY WITH DEBRIDEMENT AND BICEP TENDON REPAIR Left 02/16/2019   Procedure: LEFT SHOULDER ARTHROSCOPY LOWER TRAPEZIUS TENDON TRANSFER, BICEPS TENODESIS;  Surgeon: Meredith Pel, MD;  Location:  [place] [facility];  Service: Orthopedics;  Laterality: Left;  . SKIN BIOPSY  2019   Allergies  Allergen Reactions  . Hydrocodone Anaphylaxis  . Sertraline     sedating   Prior to Admission medications   Medication Sig Start Date End Date Taking? Authorizing Provider  albuterol (VENTOLIN HFA) 108 (90 Base) MCG/ACT inhaler Inhale 2 puffs into the lungs every 6 (six) hours as needed for wheezing or shortness of breath. 10/13/20  Yes O'Neal, Cassie Freer, MD  aspirin EC 81 MG tablet Take 81 mg by mouth daily. Swallow whole.   Yes [provider]  citalopram (CELEXA) 40 MG tablet Take 1 tablet (40 mg total) by mouth daily. 04/28/20  Yes Jacelyn Pi, Lilia Argue, MD  gabapentin (NEURONTIN) 300 MG capsule Take 1 capsule (300 mg total) by mouth every morning AND 2 capsules (600 mg total) at bedtime. 10/01/20  Yes [name], MD  ivermectin (STROMECTOL) 3 MG TABS tablet Take 18 mg by mouth. Patient takes 6 tablets every two weeks.   Yes [provider]  meloxicam (MOBIC) 15 MG tablet Take 1 tablet (15 mg total) by mouth daily. 08/20/20  Yes Magnant, Charles L, PA-C  predniSONE (STERAPRED UNI-PAK 48 TAB) 10 MG (48) TBPK tablet  SMARTSIG:2 By Mouth Twice Daily PRN 11/08/20  Yes [provider]  traMADol (ULTRAM) 50 MG tablet Take 1 tablet (50 mg total) by mouth every 6 (six) hours as needed. 11/13/20 11/13/21 Yes Magnant, Gerrianne Scale, PA-C  amphetamine-dextroamphetamine (ADDERALL) 15 MG tablet Take 1 tablet by mouth daily before breakfast. 08/25/20 09/24/20  [name], MD   Social History   Socioeconomic History  . Marital status: Single    Spouse name: Not on file  . Number of children: 3  . Years of education: Not on file  . Highest education level: Not on file  Occupational History  . Not on file  Tobacco Use  . Smoking status: Current Every Day Smoker    Packs/day: 1.00    Years: 50.00    Pack years: 50.00    Types: Cigarettes  . Smokeless tobacco: Never Used  Vaping Use  . Vaping Use: Former  Substance and Sexual Activity  . Alcohol use: No  . Drug use: No    Frequency: 7.0 times per week    Types: IV    Comment: history of Rx opoid dependency  . Sexual activity: Not Currently  Other Topics Concern  . Not on file  Social History Narrative  . Not on file   Social Determinants of Health   Financial Resource Strain: Not on file  Food Insecurity: Not on file  Transportation Needs: Not on file  Physical Activity: Not on file  Stress: Not on file  Social Connections: Not on file  Intimate Partner Violence: Not on file    Review of Systems   Objective:   Vitals:   11/24/20 0820  BP: 110/66  Pulse: 88  Resp: 16  Temp: 98.2 F (36.8 C)  TempSrc: Temporal  SpO2: 98%  Weight: 184 lb (83.5 kg)  Height: 6' (1.829 m)     Physical Exam Vitals reviewed.  Constitutional:      Appearance: He is well-developed.  HENT:     Head: Normocephalic and atraumatic.     Right Ear: External ear normal.     Left Ear: External ear normal.  Eyes:     Conjunctiva/sclera: Conjunctivae normal.     Pupils: Pupils are equal, round, and reactive to  light.  Neck:     Thyroid: No  thyromegaly.  Cardiovascular:     Rate and Rhythm: Normal rate and regular rhythm.     Heart sounds: Normal heart sounds.  Pulmonary:     Effort: Pulmonary effort is normal. No respiratory distress.     Breath sounds: Normal breath sounds. No wheezing.  Abdominal:     General: There is no distension.     Palpations: Abdomen is soft.     Tenderness: There is no abdominal tenderness.     Hernia: There is no hernia in the left inguinal area or right inguinal area.  Genitourinary:    Prostate: Normal.  Musculoskeletal:        General: No tenderness. Normal range of motion.     Cervical back: Normal range of motion and neck supple.  Lymphadenopathy:     Cervical: No cervical adenopathy.  Skin:    General: Skin is warm and dry.  Neurological:     Mental Status: He is alert and oriented to person, place, and time.     Deep Tendon Reflexes: Reflexes are normal and symmetric.  Psychiatric:        Mood and Affect: Mood normal.        Behavior: Behavior normal.      Assessment & Plan:  Louis Zimmerman is a 60 y.o. male . Annual physical exam  - -anticipatory guidance as below in AVS, screening labs above. Health maintenance items as above in HPI discussed/recommended as applicable.   Attention deficit disorder (ADD) in adult - Plan: amphetamine-dextroamphetamine (ADDERALL) 15 MG tablet, amphetamine-dextroamphetamine (ADDERALL) 15 MG tablet, amphetamine-dextroamphetamine (ADDERALL) 15 MG tablet  - .stable.  Declines changes in regimen or weekend dosing at this time.  3 months prescription given, recheck 3 months.  Pulmonary emphysema, unspecified emphysema type ([place])  -Keep appointment with pulmonary as planned.  Lack of reversibility on PFTs, albuterol unlikely to be effective.   Smoking cessation and resources discussed/provided.  Prediabetes - Plan: Hemoglobin A1c  Family history of prostate cancer - Plan: PSA Screening for prostate cancer - Plan: PSA  -Declined DRE at this  time, start with PSA screening.  Screening for hyperlipidemia - Plan: Comprehensive metabolic panel, Lipid panel  Paresthesia of foot, bilateral - Plan: gabapentin (NEURONTIN) 300 MG capsule  -Overall stable control of symptoms of foot paresthesias, other pains.  Continue same dose gabapentin.  Moderate episode of recurrent major depressive disorder ([place]) - Plan: citalopram (CELEXA) 40 MG tablet  -Reports stable control of symptoms, will continue Celexa same dose.  Cigarette nicotine dependence without complication  -Commended on decreased tobacco use, handout given on further resources to help with cessation  Meds ordered this encounter  Medications  . citalopram (CELEXA) 40 MG tablet    Sig: Take 1 tablet (40 mg total) by mouth daily.    Dispense:  90 tablet    Refill:  1  . gabapentin (NEURONTIN) 300 MG capsule    Sig: Take 1 capsule (300 mg total) by mouth every morning AND 2 capsules (600 mg total) at bedtime.    Dispense:  270 capsule    Refill:  1  . amphetamine-dextroamphetamine (ADDERALL) 15 MG tablet    Sig: Take 0.5 tablets by mouth 2 (two) times daily.    Dispense:  30 tablet    Refill:  0  . amphetamine-dextroamphetamine (ADDERALL) 15 MG tablet    Sig: Take 0.5 tablets by mouth 2 (two) times daily.    Dispense:  30 tablet  Refill:  0    Fill in 30 days.  Marland Kitchen. amphetamine-dextroamphetamine (ADDERALL) 15 MG tablet    Sig: Take 0.5 tablets by mouth 2 (two) times daily.    Dispense:  30 tablet    Refill:  0    Ok to fill in 60 days.   Patient Instructions   Keep follow up with pulmonary specialist as planned. Quitting smoking is very important. Can discuss different treatments with the lung specialist, but can stop albuterol if no change in symptoms.  Return to the clinic or go to the nearest emergency room if any of your symptoms worsen or new symptoms occur.  Keep follow up with ortho regarding your shoulder and other musculoskeletal issues. Ideally would like to  see you come off meloxicam if possible in the future.   No change in meds for today.   I do recommend dentist and eye specialist appointments.    Keeping you healthy   Get these tests  Blood pressure- Have your blood pressure checked once a year by your healthcare provider.  Normal blood pressure is 120/80  Weight- Have your body mass index (BMI) calculated to screen for obesity.  BMI is a measure of body fat based on height and weight. You can also calculate your own BMI at [email or web address]/.  Cholesterol- Have your cholesterol checked every year.  Diabetes- Have your blood sugar checked regularly if you have high blood pressure, high cholesterol, have a family history of diabetes or if you are overweight.  Screening for Colon Cancer- Colonoscopy starting at age 59.  Screening may begin sooner depending on your family history and other health conditions. Follow up colonoscopy as directed by your Gastroenterologist.  Screening for Prostate Cancer- Both blood work (PSA) and a rectal exam help screen for Prostate Cancer.  Screening begins at age 59 with African-American men and at age 59 with Caucasian men.  Screening may begin sooner depending on your family history.   Take these medicinesFlu shot- Every fall.  Tetanus- Every 10 years.  Shingrix shingles vaccine after age 59.   Pneumonia shot- Once after the age of 59; if you are younger than 8465, ask your healthcare provider if you need a Pneumonia shot.  Take these steps  Don't smoke- If you do smoke, talk to your doctor about quitting.  For tips on how to quit, go to www.smokefree.gov or call 1-800-QUIT-NOW.  Be physically active- Exercise 5 days a week for at least 30 minutes.  If you are not already physically active start slow and gradually work up to 30 minutes of moderate physical activity.  Examples of moderate activity include walking briskly, mowing the yard, dancing, swimming, bicycling, etc.  Eat a healthy  diet- Eat a variety of healthy food such as fruits, vegetables, low fat milk, low fat cheese, yogurt, lean meant, poultry, fish, beans, tofu, etc. For more information go to www.thenutritionsource.org  Drink alcohol in moderation- Limit alcohol intake to less than two drinks a day. Never drink and drive.  Dentist- Brush and floss twice daily; visit your dentist twice a year.  Depression- Your emotional health is as important as your physical health. If you're feeling down, or losing interest in things you would normally enjoy please talk to your healthcare provider.  Eye exam- Visit your eye doctor every year.  Safe sex- If you may be exposed to a sexually transmitted infection, use a condom.  Seat belts- Seat belts can save your life; always wear one.  Smoke/Carbon Monoxide detectors-  These detectors need to be installed on the appropriate level of your home.  Replace batteries at least once a year.  Skin cancer- When out in the sun, cover up and use sunscreen 15 SPF or higher.  Violence- If anyone is threatening you, please tell your healthcare provider.  Living Will/ Health care power of attorney- Speak with your healthcare provider and family.  Here is info on smoking cessation:  Steps to Quit Smoking Smoking tobacco is the leading cause of preventable death. It can affect almost every organ in the body. Smoking puts you and those around you at risk for developing many serious chronic diseases. Quitting smoking can be difficult, but it is one of the best things that you can do for your health. It is never too late to quit. How do I get ready to quit? When you decide to quit smoking, create a plan to help you succeed. Before you quit:  Pick a date to quit. Set a date within the next 2 weeks to give you time to prepare.  Write down the reasons why you are quitting. Keep this list in places where you will see it often.  Tell your family, friends, and co-workers that you are  quitting. Support from your loved ones can make quitting easier.  Talk with your health care provider about your options for quitting smoking.  Find out what treatment options are covered by your health insurance.  Identify people, places, things, and activities that make you want to smoke (triggers). Avoid them. What first steps can I take to quit smoking?  Throw away all cigarettes at home, at work, and in your car.  Throw away smoking accessories, such as Scientist, research (medical).  Clean your car. Make sure to empty the ashtray.  Clean your home, including curtains and carpets. What strategies can I use to quit smoking? Talk with your health care provider about combining strategies, such as taking medicines while you are also receiving in-person counseling. Using these two strategies together makes you more likely to succeed in quitting than if you used either strategy on its own.  If you are pregnant or breastfeeding, talk with your health care provider about finding counseling or other support strategies to quit smoking. Do not take medicine to help you quit smoking unless your health care provider tells you to do so. To quit smoking: Quit right away  Quit smoking completely, instead of gradually reducing how much you smoke over a period of time. Research shows that stopping smoking right away is more successful than gradually quitting.  Attend in-person counseling to help you build problem-solving skills. You are more likely to succeed in quitting if you attend counseling sessions regularly. Even short sessions of 10 minutes can be effective. Take medicine You may take medicines to help you quit smoking. Some medicines require a prescription and some you can purchase over-the-counter. Medicines may have nicotine in them to replace the nicotine in cigarettes. Medicines may:  Help to stop cravings.  Help to relieve withdrawal symptoms. Your health care provider may  recommend:  Nicotine patches, gum, or lozenges.  Nicotine inhalers or sprays.  Non-nicotine medicine that is taken by mouth. Find resources Find resources and support systems that can help you to quit smoking and remain smoke-free after you quit. These resources are most helpful when you use them often. They include:  Online chats with a Social worker.  Telephone quitlines.  Printed Furniture conservator/restorer.  Support groups or group counseling.  Text messaging  programs.  Mobile phone apps or applications. Use apps that can help you stick to your quit plan by providing reminders, tips, and encouragement. There are many free apps for mobile devices as well as websites. Examples include Quit Guide from the [employer] and smokefree.gov   What things can I do to make it easier to quit?  Reach out to your family and friends for support and encouragement. Call telephone quitlines (1-800-QUIT-NOW), reach out to support groups, or work with a counselor for support.  Ask people who smoke to avoid smoking around you.  Avoid places that trigger you to smoke, such as bars, parties, or smoke-break areas at work.  Spend time with people who do not smoke.  Lessen the stress in your life. Stress can be a smoking trigger for some people. To lessen stress, try: ? Exercising regularly. ? Doing deep-breathing exercises. ? Doing yoga. ? Meditating. ? Performing a body scan. This involves closing your eyes, scanning your body from head to toe, and noticing which parts of your body are particularly tense. Try to relax the muscles in those areas.   How will I feel when I quit smoking? Day 1 to 3 weeks Within the first 24 hours of quitting smoking, you may start to feel withdrawal symptoms. These symptoms are usually most noticeable 2-3 days after quitting, but they usually do not last for more than 2-3 weeks. You may experience these symptoms:  Mood swings.  Restlessness, anxiety, or irritability.  Trouble  concentrating.  Dizziness.  Strong cravings for sugary foods and nicotine.  Mild weight gain.  Constipation.  Nausea.  Coughing or a sore throat.  Changes in how the medicines that you take for unrelated issues work in your body.  Depression.  Trouble sleeping (insomnia). Week 3 and afterward After the first 2-3 weeks of quitting, you may start to notice more positive results, such as:  Improved sense of smell and taste.  Decreased coughing and sore throat.  Slower heart rate.  Lower blood pressure.  Clearer skin.  The ability to breathe more easily.  Fewer sick days. Quitting smoking can be very challenging. Do not get discouraged if you are not successful the first time. Some people need to make many attempts to quit before they achieve long-term success. Do your best to stick to your quit plan, and talk with your health care provider if you have any questions or concerns. Summary  Smoking tobacco is the leading cause of preventable death. Quitting smoking is one of the best things that you can do for your health.  When you decide to quit smoking, create a plan to help you succeed.  Quit smoking right away, not slowly over a period of time.  When you start quitting, seek help from your health care provider, family, or friends. This information is not intended to replace advice given to you by your health care provider. Make sure you discuss any questions you have with your health care provider. Document Revised: 04/09/2019 Document Reviewed: 10/03/2018 Elsevier Patient Education  2021 [place].     Signed, Merri Ray, MD [facility]

## 2020-11-24 NOTE — Patient Instructions (Addendum)
Keep follow up with pulmonary specialist as planned. Quitting smoking is very important. Can discuss different treatments with the lung specialist, but can stop albuterol if no change in symptoms.  Return to the clinic or go to the nearest emergency room if any of your symptoms worsen or new symptoms occur.  Keep follow up with ortho regarding your shoulder and other musculoskeletal issues. Ideally would like to see you come off meloxicam if possible in the future.   No change in meds for today.   I do recommend dentist and eye specialist appointments.   I do recommend pneumonia, shingles and covid vaccines. Please let me know if there are questions.   Recheck in 3 months.    Keeping you healthy   Get these tests  Blood pressure- Have your blood pressure checked once a year by your healthcare provider.  Normal blood pressure is 120/80  Weight- Have your body mass index (BMI) calculated to screen for obesity.  BMI is a measure of body fat based on height and weight. You can also calculate your own BMI at [employer].  Cholesterol- Have your cholesterol checked every year.  Diabetes- Have your blood sugar checked regularly if you have high blood pressure, high cholesterol, have a family history of diabetes or if you are overweight.  Screening for Colon Cancer- Colonoscopy starting at age 68.  Screening may begin sooner depending on your family history and other health conditions. Follow up colonoscopy as directed by your Gastroenterologist.  Screening for Prostate Cancer- Both blood work (PSA) and a rectal exam help screen for Prostate Cancer.  Screening begins at age 42 with African-American men and at age 10 with Caucasian men.  Screening may begin sooner depending on your family history.   Take these medicinesFlu shot- Every fall.  Tetanus- Every 10 years.  Shingrix shingles vaccine after age 80.   Pneumonia shot- Once after the age of 77; if you are younger than  49, ask your healthcare provider if you need a Pneumonia shot.  Take these steps  Don't smoke- If you do smoke, talk to your doctor about quitting.  For tips on how to quit, go to www.smokefree.gov or call 1-800-QUIT-NOW.  Be physically active- Exercise 5 days a week for at least 30 minutes.  If you are not already physically active start slow and gradually work up to 30 minutes of moderate physical activity.  Examples of moderate activity include walking briskly, mowing the yard, dancing, swimming, bicycling, etc.  Eat a healthy diet- Eat a variety of healthy food such as fruits, vegetables, low fat milk, low fat cheese, yogurt, lean meant, poultry, fish, beans, tofu, etc. For more information go to www.thenutritionsource.org  Drink alcohol in moderation- Limit alcohol intake to less than two drinks a day. Never drink and drive.  Dentist- Brush and floss twice daily; visit your dentist twice a year.  Depression- Your emotional health is as important as your physical health. If you're feeling down, or losing interest in things you would normally enjoy please talk to your healthcare provider.  Eye exam- Visit your eye doctor every year.  Safe sex- If you may be exposed to a sexually transmitted infection, use a condom.  Seat belts- Seat belts can save your life; always wear one.  Smoke/Carbon Monoxide detectors- These detectors need to be installed on the appropriate level of your home.  Replace batteries at least once a year.  Skin cancer- When out in the sun, cover up and use sunscreen 15  SPF or higher.  Violence- If anyone is threatening you, please tell your healthcare provider.  Living Will/ Health care power of attorney- Speak with your healthcare provider and family.  Here is info on smoking cessation:  Steps to Quit Smoking Smoking tobacco is the leading cause of preventable death. It can affect almost every organ in the body. Smoking puts you and those around you at risk for  developing many serious chronic diseases. Quitting smoking can be difficult, but it is one of the best things that you can do for your health. It is never too late to quit. How do I get ready to quit? When you decide to quit smoking, create a plan to help you succeed. Before you quit:  Pick a date to quit. Set a date within the next 2 weeks to give you time to prepare.  Write down the reasons why you are quitting. Keep this list in places where you will see it often.  Tell your family, friends, and co-workers that you are quitting. Support from your loved ones can make quitting easier.  Talk with your health care provider about your options for quitting smoking.  Find out what treatment options are covered by your health insurance.  Identify people, places, things, and activities that make you want to smoke (triggers). Avoid them. What first steps can I take to quit smoking?  Throw away all cigarettes at home, at work, and in your car.  Throw away smoking accessories, such as Scientist, research (medical).  Clean your car. Make sure to empty the ashtray.  Clean your home, including curtains and carpets. What strategies can I use to quit smoking? Talk with your health care provider about combining strategies, such as taking medicines while you are also receiving in-person counseling. Using these two strategies together makes you more likely to succeed in quitting than if you used either strategy on its own.  If you are pregnant or breastfeeding, talk with your health care provider about finding counseling or other support strategies to quit smoking. Do not take medicine to help you quit smoking unless your health care provider tells you to do so. To quit smoking: Quit right away  Quit smoking completely, instead of gradually reducing how much you smoke over a period of time. Research shows that stopping smoking right away is more successful than gradually quitting.  Attend in-person  counseling to help you build problem-solving skills. You are more likely to succeed in quitting if you attend counseling sessions regularly. Even short sessions of 10 minutes can be effective. Take medicine You may take medicines to help you quit smoking. Some medicines require a prescription and some you can purchase over-the-counter. Medicines may have nicotine in them to replace the nicotine in cigarettes. Medicines may:  Help to stop cravings.  Help to relieve withdrawal symptoms. Your health care provider may recommend:  Nicotine patches, gum, or lozenges.  Nicotine inhalers or sprays.  Non-nicotine medicine that is taken by mouth. Find resources Find resources and support systems that can help you to quit smoking and remain smoke-free after you quit. These resources are most helpful when you use them often. They include:  Online chats with a Social worker.  Telephone quitlines.  Printed Furniture conservator/restorer.  Support groups or group counseling.  Text messaging programs.  Mobile phone apps or applications. Use apps that can help you stick to your quit plan by providing reminders, tips, and encouragement. There are many free apps for mobile devices as well as  websites. Examples include Quit Guide from the [employer] and smokefree.gov   What things can I do to make it easier to quit?  Reach out to your family and friends for support and encouragement. Call telephone quitlines (1-800-QUIT-NOW), reach out to support groups, or work with a counselor for support.  Ask people who smoke to avoid smoking around you.  Avoid places that trigger you to smoke, such as bars, parties, or smoke-break areas at work.  Spend time with people who do not smoke.  Lessen the stress in your life. Stress can be a smoking trigger for some people. To lessen stress, try: ? Exercising regularly. ? Doing deep-breathing exercises. ? Doing yoga. ? Meditating. ? Performing a body scan. This involves closing your  eyes, scanning your body from head to toe, and noticing which parts of your body are particularly tense. Try to relax the muscles in those areas.   How will I feel when I quit smoking? Day 1 to 3 weeks Within the first 24 hours of quitting smoking, you may start to feel withdrawal symptoms. These symptoms are usually most noticeable 2-3 days after quitting, but they usually do not last for more than 2-3 weeks. You may experience these symptoms:  Mood swings.  Restlessness, anxiety, or irritability.  Trouble concentrating.  Dizziness.  Strong cravings for sugary foods and nicotine.  Mild weight gain.  Constipation.  Nausea.  Coughing or a sore throat.  Changes in how the medicines that you take for unrelated issues work in your body.  Depression.  Trouble sleeping (insomnia). Week 3 and afterward After the first 2-3 weeks of quitting, you may start to notice more positive results, such as:  Improved sense of smell and taste.  Decreased coughing and sore throat.  Slower heart rate.  Lower blood pressure.  Clearer skin.  The ability to breathe more easily.  Fewer sick days. Quitting smoking can be very challenging. Do not get discouraged if you are not successful the first time. Some people need to make many attempts to quit before they achieve long-term success. Do your best to stick to your quit plan, and talk with your health care provider if you have any questions or concerns. Summary  Smoking tobacco is the leading cause of preventable death. Quitting smoking is one of the best things that you can do for your health.  When you decide to quit smoking, create a plan to help you succeed.  Quit smoking right away, not slowly over a period of time.  When you start quitting, seek help from your health care provider, family, or friends. This information is not intended to replace advice given to you by your health care provider. Make sure you discuss any questions you  have with your health care provider. Document Revised: 04/09/2019 Document Reviewed: 10/03/2018 Elsevier Patient Education  [place].

## 2020-11-25 ENCOUNTER — Encounter (HOSPITAL_COMMUNITY): Payer: Self-pay

## 2020-11-25 ENCOUNTER — Other Ambulatory Visit: Payer: Self-pay

## 2020-11-25 ENCOUNTER — Ambulatory Visit (HOSPITAL_COMMUNITY)
Admission: EM | Admit: 2020-11-25 | Discharge: 2020-11-25 | Disposition: A | Discharge status: Home / Self Care | Payer: BC Managed Care – PPO

## 2020-11-25 DIAGNOSIS — T8130XA Disruption of wound, unspecified, initial encounter: Secondary | ICD-10-CM

## 2020-11-25 NOTE — ED Provider Notes (Signed)
[facility]    CSN: [id number] Arrival date & time: 11/25/20  1357      History   Chief Complaint Chief Complaint  Patient presents with  . Open Wound from Suture Removal    HPI Louis Zimmerman is a 60 y.o. male.   Patient here for evaluation of open wound.  Patient reports recently having carpal tunnel surgery on his right wrist and had sutures removed yesterday.  Patient reports noticing today that wound was open and dressing was damp.  Denies any significant pain or swelling.  Has not taken any OTC medications or treatments. Denies any trauma, injury, or other precipitating event.  Denies any specific alleviating or aggravating factors.  Denies any fevers, chest pain, shortness of breath, N/V/D, numbness, tingling, weakness, abdominal pain, or headaches.   ROS: As per HPI, all other pertinent ROS negative   The history is provided by the patient.    Past Medical History:  Diagnosis Date  . ADD (attention deficit disorder)   . Anxiety   . Arthritis    feet, shoulders  . Cancer ([place])    skin cancer  . COPD (chronic obstructive pulmonary disease) ([place])   . Depression   . Dyspnea    with exertion  . Neuromuscular disorder ([name])    carpal tunnel  . Pre-diabetes     Patient Active Problem List   Diagnosis Date Noted  . Carpal tunnel syndrome, right upper limb   . Biceps tendinitis of left upper extremity   . Rotator cuff arthropathy 02/16/2019  . FHx: colon cancer 12/18/2018  . Vasculitis ([place]) 06/26/2018  . AKI (acute kidney injury) ([place]) 06/26/2018  . 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 Attention deficit disorder (ADD) in adult 11/10/2017  . Moderate episode of recurrent major depressive disorder ([place]) 08/31/2017  . 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 Disturbed concentration 08/31/2017  . Tobacco use disorder 08/31/2017  . History of substance abuse ([place]) 02/23/2017  . Depression 02/02/2013    Past Surgical History:  Procedure Laterality  Date  . CARPAL TUNNEL RELEASE Right 11/13/2020   Procedure: RIGHT CARPAL TUNNEL RELEASE;  Surgeon: Meredith Pel, MD;  Location: [facility];  Service: Orthopedics;  Laterality: Right;  . CHOLECYSTECTOMY    . COLONOSCOPY     2000  . GALLBLADDER SURGERY  2003  . SHOULDER ARTHROSCOPY Left 02/16/2019   LEFT SHOULDER ARTHROSCOPY LOWER TRAPEZIUS TENDON TRANSFER, BICEPS TENODESIS  . SHOULDER ARTHROSCOPY WITH DEBRIDEMENT AND BICEP TENDON REPAIR Left 02/16/2019   Procedure: LEFT SHOULDER ARTHROSCOPY LOWER TRAPEZIUS TENDON TRANSFER, BICEPS TENODESIS;  Surgeon: Meredith Pel, MD;  Location: [facility];  Service: Orthopedics;  Laterality: Left;  . SKIN BIOPSY  2019       Home Medications    Prior to Admission medications   Medication Sig Start Date End Date Taking? Authorizing Provider  albuterol (VENTOLIN HFA) 108 (90 Base) MCG/ACT inhaler Inhale 2 puffs into the lungs every 6 (six) hours as needed for wheezing or shortness of breath. 10/13/20  Yes O'Neal, Cassie Freer, MD  amphetamine-dextroamphetamine (ADDERALL) 15 MG tablet Take 0.5 tablets by mouth 2 (two) times daily. 11/24/20 12/24/20 Yes [name], MD  aspirin EC 81 MG tablet Take 81 mg by mouth daily. Swallow whole.   Yes [provider]  citalopram (CELEXA) 40 MG tablet Take 1 tablet (40 mg total) by mouth daily. 11/24/20  Yes [name], MD  gabapentin (NEURONTIN) 300 MG capsule Take 1 capsule (300 mg  total) by mouth every morning AND 2 capsules (600 mg total) at bedtime. 11/24/20  Yes [name], MD  ivermectin (STROMECTOL) 3 MG TABS tablet Take 18 mg by mouth. Patient takes 6 tablets every two weeks.   Yes [provider]  meloxicam (MOBIC) 15 MG tablet Take 1 tablet (15 mg total) by mouth daily. 08/20/20  Yes Magnant, Charles L, PA-C  amphetamine-dextroamphetamine (ADDERALL) 15 MG tablet Take 0.5 tablets by mouth 2 (two) times daily. 11/24/20   [name], MD   amphetamine-dextroamphetamine (ADDERALL) 15 MG tablet Take 0.5 tablets by mouth 2 (two) times daily. 11/24/20   [name], MD  predniSONE (STERAPRED UNI-PAK 48 TAB) 10 MG (48) TBPK tablet SMARTSIG:2 By Mouth Twice Daily PRN 11/08/20   [provider]  traMADol (ULTRAM) 50 MG tablet Take 1 tablet (50 mg total) by mouth every 6 (six) hours as needed. 11/13/20 11/13/21  Magnant, Gerrianne Scale, PA-C    Family History Family History  Problem Relation Age of Onset  . Cancer Mother   . Dementia Mother   . Diabetes Mother   . Heart Problems Mother   . Heart disease Mother   . Liver cancer Father   . Colon cancer Father   . Alcohol abuse Maternal Grandfather   . Alcohol abuse Paternal Grandfather   . Colon polyps Sister   . Colon polyps Sister   . Esophageal cancer Neg Hx   . Stomach cancer Neg Hx   . Rectal cancer Neg Hx     Social History Social History   Tobacco Use  . Smoking status: Current Every Day Smoker    Packs/day: 1.00    Years: 50.00    Pack years: 50.00    Types: Cigarettes  . Smokeless tobacco: Never Used  Vaping Use  . Vaping Use: Former  Substance Use Topics  . Alcohol use: No  . Drug use: No    Frequency: 7.0 times per week    Types: IV    Comment: history of Rx opoid dependency     Allergies   Hydrocodone and Sertraline   Review of Systems Review of Systems  Skin: Positive for wound.  All other systems reviewed and are negative.    Physical Exam Triage Vital Signs ED Triage Vitals  Enc Vitals Group     BP 11/25/20 1450 117/70     Pulse Rate 11/25/20 1450 73     Resp 11/25/20 1450 16     Temp 11/25/20 1450 98.6 F (37 C)     Temp src --      SpO2 11/25/20 1450 97 %     Weight --      Height --      Head Circumference --      Peak Flow --      Pain Score 11/25/20 1444 0     Pain Loc --      Pain Edu? --      Excl. in [place]? --    No data found.  Updated Vital Signs BP 117/70   Pulse 73   Temp 98.6 F (37 C)   Resp 16    SpO2 97%   Visual Acuity Right Eye Distance:   Left Eye Distance:   Bilateral Distance:    Right Eye Near:   Left Eye Near:    Bilateral Near:     Physical Exam Vitals and nursing note reviewed.  Constitutional:      General: He is not in  acute distress.    Appearance: Normal appearance. He is not ill-appearing, toxic-appearing or diaphoretic.  HENT:     Head: Normocephalic and atraumatic.  Eyes:     Conjunctiva/sclera: Conjunctivae normal.  Cardiovascular:     Rate and Rhythm: Normal rate.     Pulses: Normal pulses.  Pulmonary:     Effort: Pulmonary effort is normal.  Abdominal:     General: Abdomen is flat.  Musculoskeletal:        General: Normal range of motion.     Cervical back: Normal range of motion.  Skin:    General: Skin is warm and dry.     Findings: Laceration (see photo, surgical laceration from carpal tunnel surgery) present.  Neurological:     General: No focal deficit present.     Mental Status: He is alert and oriented to person, place, and time.  Psychiatric:        Mood and Affect: Mood normal.        UC Treatments / Results  Labs (all labs ordered are listed, but only abnormal results are displayed) Labs Reviewed - No data to display  EKG   Radiology No results found.  Procedures Procedures (including critical care time)  Medications Ordered in UC Medications - No data to display  Initial Impression / Assessment and Plan / UC Course  I have reviewed the triage vital signs and the nursing notes.  Pertinent labs & imaging results that were available during my care of the patient were reviewed by me and considered in my medical decision making (see chart for details).     Assessment negative for red flags or concerns.  Steri-Strips applied in office.  Instructed to keep hand clean and dry.  Instructed to change bandages immediately if they become damp or moist.  Change bandage at least twice a day.  Follow-up with surgeon as soon  as possible on Monday morning.  Final Clinical Impressions(s) / UC Diagnoses   Final diagnoses:  Wound dehiscence     Discharge Instructions     Keep your hand clean and dry.    Do not remove the steri-strips.  Change the bandage twice a day.    Follow up with your surgeon first thing on Monday.      ED Prescriptions    None     PDMP not reviewed this encounter.   Pearson Forster, NP 11/25/20 1539

## 2020-11-25 NOTE — Discharge Instructions (Addendum)
Keep your hand clean and dry.    Do not remove the steri-strips.  Change the bandage twice a day.    Follow up with your surgeon first thing on Monday.

## 2020-11-25 NOTE — ED Triage Notes (Addendum)
Pt had sutures removed from approx. 2.5 inch right hand laceration yesterday. Pt was sleeping on stomach with hand under chest last night, and upon awakening noticed that the laceration had come back open. Pt also states that he was cracking crab legs last night and that could have contributed.  No drainage noted from the area. Laceration appears deep and edges are no longer approximated. Pt had had laceration covered with occlusive dressing and wound appears very moist with white discoloration noted to edges of wound. Pt states he showered this morning and wonders if water got in despite covering with plastic glove. Denies loss of sensation to the area.   Pt also reports that he has an ulcer in his mouth that he never picked up prescribed meds for two weeks ago. States ulcer has not gotten better but is not getting worse. States meds were not covered by insurance.

## 2020-11-27 ENCOUNTER — Other Ambulatory Visit: Payer: Self-pay

## 2020-11-27 ENCOUNTER — Telehealth: Payer: Self-pay | Admitting: Orthopedic Surgery

## 2020-11-27 MED ORDER — DOXYCYCLINE HYCLATE 100 MG PO TABS: 100.0000 mg | ORAL_TABLET | Freq: Two times a day (BID) | ORAL | 0 refills | Status: DC

## 2020-11-27 NOTE — Progress Notes (Signed)
looked at the pictures.  I think he is okay to go on antibiotics and then follow-up on Friday.  However diminished activity in the hand is recommended on Friday.  Does not look overtly infected at this time.  It will have to heal by secondary intention which will keep him out of physical activity longer than if it has been allowed to heal.

## 2020-11-27 NOTE — Telephone Encounter (Signed)
Please advise 

## 2020-11-27 NOTE — Telephone Encounter (Signed)
I called.  He is going to keep dry dressing on during the day and let it air out at night.  Planning to come in this Friday just for quick peek.

## 2020-11-27 NOTE — Telephone Encounter (Signed)
Patient called asked how should he care for the open wound on the palm of his hand.  Patient said he has started the antibiotics. Patient said Dr [name] is aware that the incision has opened.   The number to contact patient is [phone]

## 2020-11-27 NOTE — Progress Notes (Signed)
Patient seen in office last Friday and sutures removed-patient advised not to be doing any heavy lifting, physical work with hands for the next 2 weeks.  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  Pictures texted to Dr [name] for review.  Rx for doxy sent to pharmacy per Dr Deans request. Advised patient to follow up with Dr [name] on Friday.

## 2020-11-28 ENCOUNTER — Telehealth: Payer: Self-pay | Admitting: Internal Medicine

## 2020-11-28 NOTE — Telephone Encounter (Signed)
OK no charge ?

## 2020-11-28 NOTE — Telephone Encounter (Signed)
Inbound call from patient. Cancel procedure 5/6. Patient tested positive for COVID

## 2020-12-01 ENCOUNTER — Other Ambulatory Visit: Payer: BC Managed Care – PPO

## 2020-12-01 ENCOUNTER — Other Ambulatory Visit: Payer: Self-pay

## 2020-12-01 ENCOUNTER — Encounter: Payer: BC Managed Care – PPO | Admitting: Internal Medicine

## 2020-12-01 ENCOUNTER — Ambulatory Visit (INDEPENDENT_AMBULATORY_CARE_PROVIDER_SITE_OTHER): Payer: BC Managed Care – PPO | Admitting: Orthopedic Surgery

## 2020-12-01 DIAGNOSIS — G5603 Carpal tunnel syndrome, bilateral upper limbs: Secondary | ICD-10-CM

## 2020-12-02 ENCOUNTER — Encounter: Payer: Self-pay | Admitting: Orthopedic Surgery

## 2020-12-02 NOTE — Progress Notes (Signed)
Post-Op Visit Note   Patient: Louis Zimmerman           Date of Birth: 16-Aug-1960           MRN: [id number] Visit Date: 12/01/2020 PCP: Louis Agreste, MD   Assessment & Plan:  Chief Complaint:  Chief Complaint  Patient presents with  . Right Hand - Wound Check, Routine Post Op   Visit Diagnoses:  1. Bilateral carpal tunnel syndrome     Plan: Louis Zimmerman is a patient is now several weeks out right carpal tunnel release.  A little bit of gapping of the incision but no infection is present on exam.  Incision is healing up nicely by secondary intention.  Gapping only about 2 mm.  Numbness and tingling is good.  Radial pulses intact.  He is going to continue with conservative treatment of this hand and will be careful with over use.  When this is fully healed which may take 3 weeks to 4 weeks he will call to schedule his left carpal tunnel release.  Follow-Up Instructions: Return if symptoms worsen or fail to improve.   Orders:  No orders of the defined types were placed in this encounter.  No orders of the defined types were placed in this encounter.   Imaging: No results found.  PMFS History: Patient Active Problem List   Diagnosis Date Noted  . Carpal tunnel syndrome, right upper limb   . Biceps tendinitis of left upper extremity   . Rotator cuff arthropathy 02/16/2019  . FHx: colon cancer 12/18/2018  . Vasculitis ([place]) 06/26/2018  . AKI (acute kidney injury) ([place]) 06/26/2018  . 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 Attention deficit disorder (ADD) in adult 11/10/2017  . Moderate episode of recurrent major depressive disorder ([place]) 08/31/2017  . 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 Disturbed concentration 08/31/2017  . Tobacco use disorder 08/31/2017  . History of substance abuse ([place]) 02/23/2017  . Depression 02/02/2013   Past Medical History:  Diagnosis Date  . ADD (attention deficit disorder)   . Anxiety   . Arthritis    feet, shoulders  . Cancer ([place])     skin cancer  . COPD (chronic obstructive pulmonary disease) ([place])   . Depression   . Dyspnea    with exertion  . Neuromuscular disorder ([name])    carpal tunnel  . Pre-diabetes     Family History  Problem Relation Age of Onset  . Cancer Mother   . Dementia Mother   . Diabetes Mother   . Heart Problems Mother   . Heart disease Mother   . Liver cancer Father   . Colon cancer Father   . Alcohol abuse Maternal Grandfather   . Alcohol abuse Paternal Grandfather   . Colon polyps Sister   . Colon polyps Sister   . Esophageal cancer Neg Hx   . Stomach cancer Neg Hx   . Rectal cancer Neg Hx     Past Surgical History:  Procedure Laterality Date  . CARPAL TUNNEL RELEASE Right 11/13/2020   Procedure: RIGHT CARPAL TUNNEL RELEASE;  Surgeon: Meredith Pel, MD;  Location: [facility];  Service: Orthopedics;  Laterality: Right;  . CHOLECYSTECTOMY    . COLONOSCOPY     2000  . GALLBLADDER SURGERY  2003  . SHOULDER ARTHROSCOPY Left 02/16/2019   LEFT SHOULDER ARTHROSCOPY LOWER TRAPEZIUS TENDON TRANSFER, BICEPS TENODESIS  . SHOULDER ARTHROSCOPY WITH DEBRIDEMENT AND BICEP TENDON REPAIR Left 02/16/2019   Procedure: LEFT SHOULDER ARTHROSCOPY  LOWER TRAPEZIUS TENDON TRANSFER, BICEPS TENODESIS;  Surgeon: Meredith Pel, MD;  Location: [facility];  Service: Orthopedics;  Laterality: Left;  . SKIN BIOPSY  2019   Social History   Occupational History  . Not on file  Tobacco Use  . Smoking status: Current Every Day Smoker    Packs/day: 1.00    Years: 50.00    Pack years: 50.00    Types: Cigarettes  . Smokeless tobacco: Never Used  Vaping Use  . Vaping Use: Former  Substance and Sexual Activity  . Alcohol use: No  . Drug use: No    Frequency: 7.0 times per week    Types: IV    Comment: history of Rx opoid dependency  . Sexual activity: Not Currently

## 2020-12-06 IMAGING — MR MRI OF THE LEFT SHOULDER WITH CONTRAST
5 series · 40 of 40 positions shown · IV contrast (agent unspecified)
Comparison: Plain films left shoulder 12/11/2018 from [REDACTED].

CLINICAL DATA: Left shoulder pain and weakness since an unspecified
injury in October 2018 when the patient Yoon Sug Sndrilgii in the
shoulder.

EXAM:
MR ARTHROGRAM OF THE LEFT SHOULDER
TECHNIQUE: Multiplanar, multisequence MR imaging of the left shoulder was
performed following the administration of intra-articular contrast.
CONTRAST:  See Injection Documentation.

[Series 3: T1 fat-sat · axial · 4.0mm · 0.27mm/px · z∈[-82,+28]mm · 8 of 24 slices shown (1 of 3)]
[im 1/24]
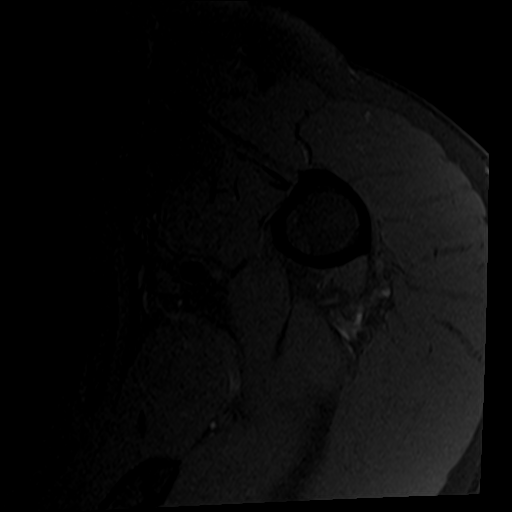
[im 4/24]
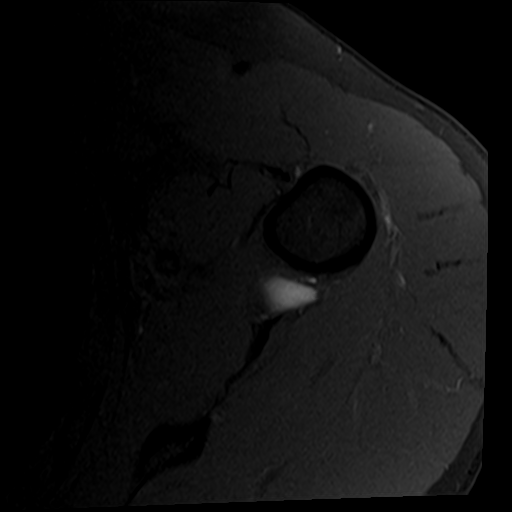
[im 7/24]
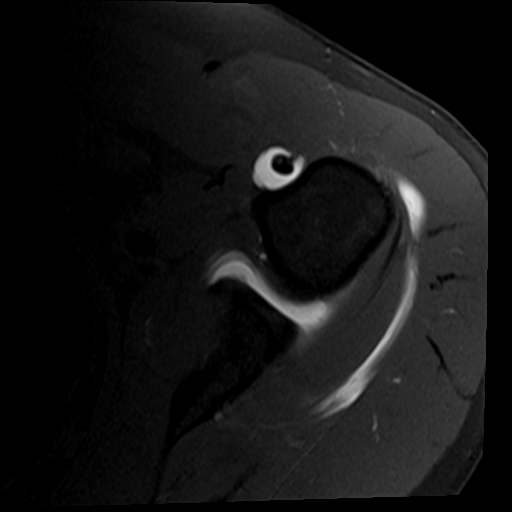
[im 10/24]
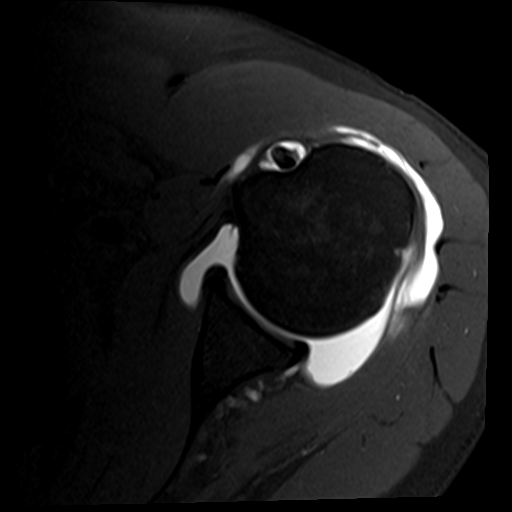
[im 14/24]
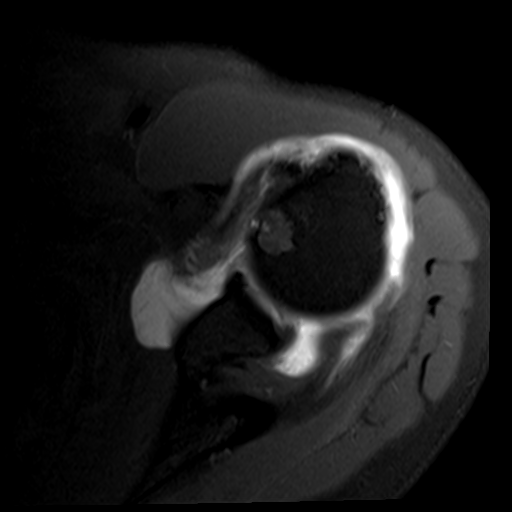
[im 17/24]
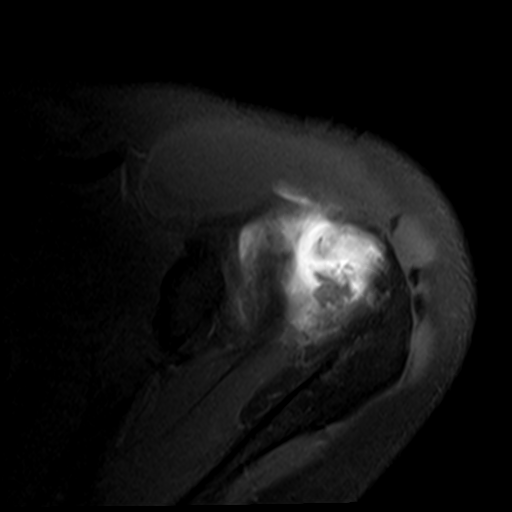
[im 20/24]
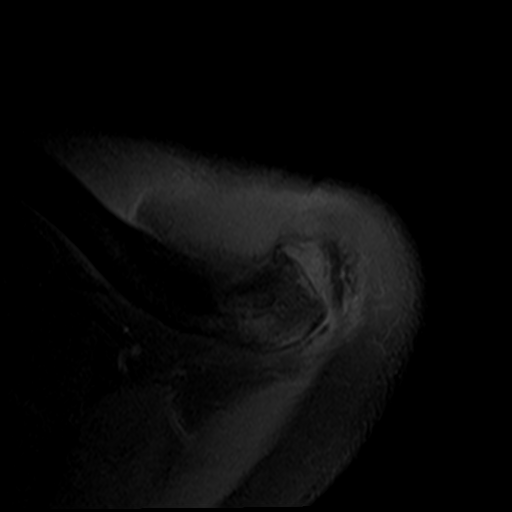
[im 24/24]
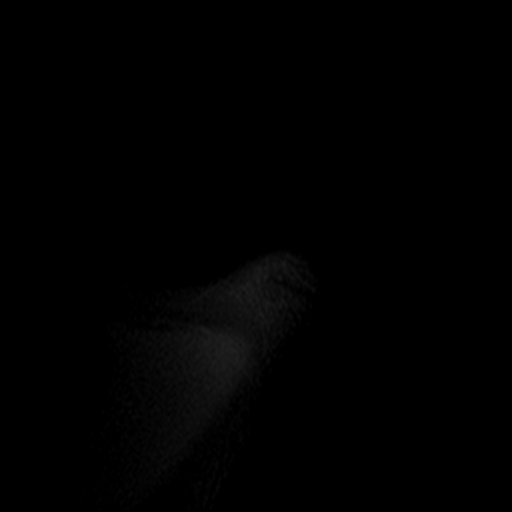

[Series 5: T1 fat-sat · sagittal · 4.0mm · 0.55mm/px · 8 of 22 slices shown (2 of 3)]
[im 1/22]
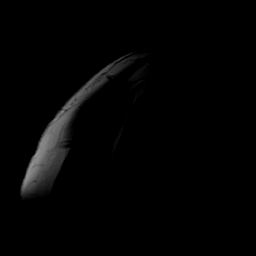
[im 4/22]
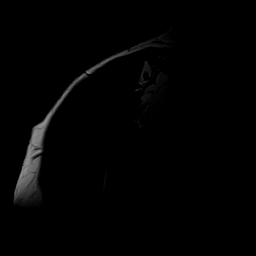
[im 7/22]
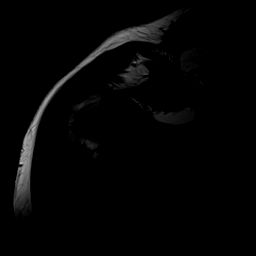
[im 10/22]
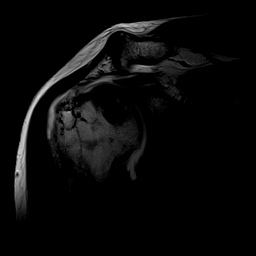
[im 13/22]
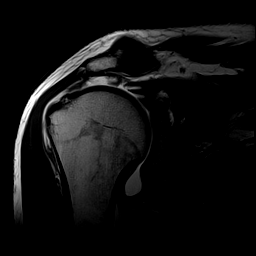
[im 16/22]
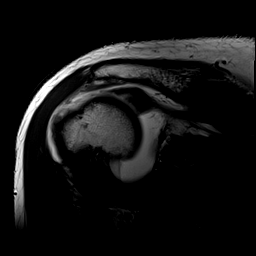
[im 19/22]
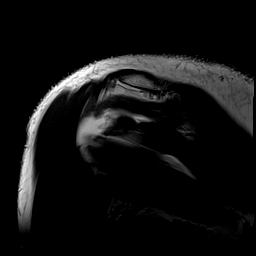
[im 22/22]
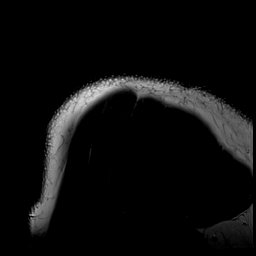

[Series 6: T1 fat-sat · sagittal · 4.0mm · 0.55mm/px · 8 of 22 slices shown (3 of 3)]
[im 1/22]
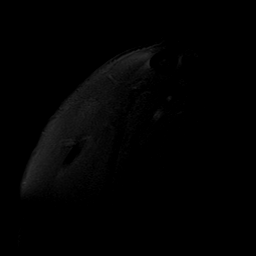
[im 4/22]
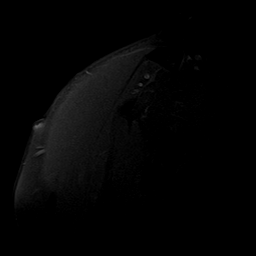
[im 7/22]
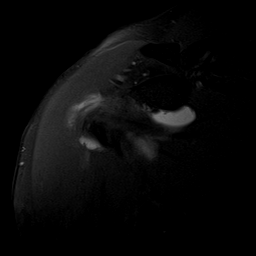
[im 10/22]
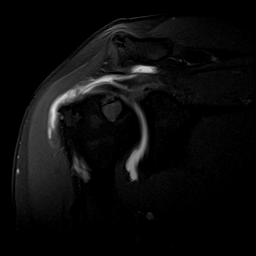
[im 13/22]
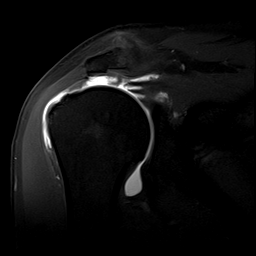
[im 16/22]
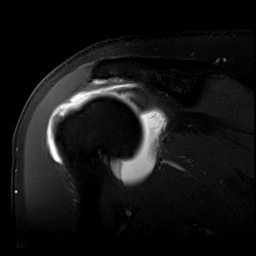
[im 19/22]
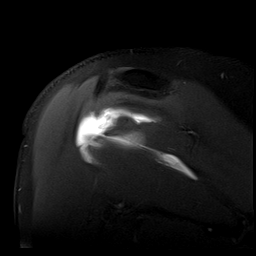
[im 22/22]
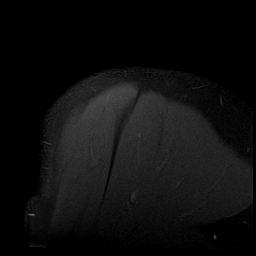

[Series 7: T2 fat-sat · sagittal · 4.0mm · 0.55mm/px · 8 of 22 slices shown (1 of 2)]
[im 1/22]
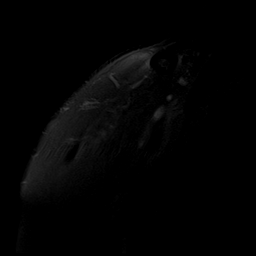
[im 4/22]
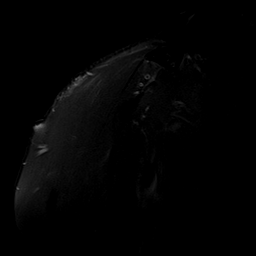
[im 7/22]
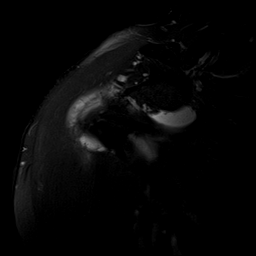
[im 10/22]
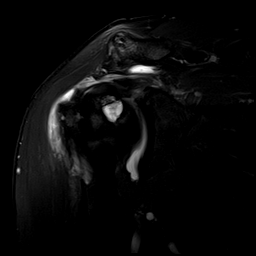
[im 13/22]
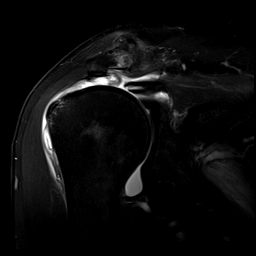
[im 16/22]
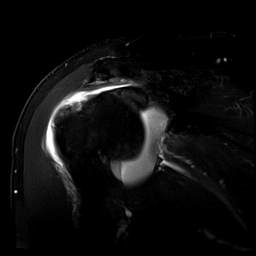
[im 19/22]
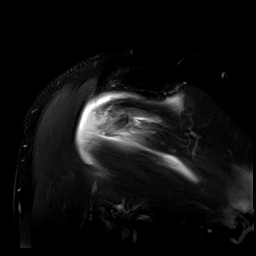
[im 22/22]
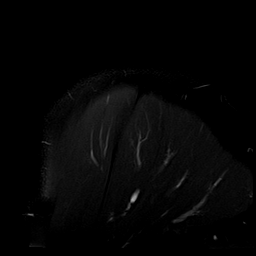

[Series 8: T2 fat-sat · coronal · 4.0mm · 0.55mm/px · 8 of 24 slices shown (2 of 2)]
[im 1/24]
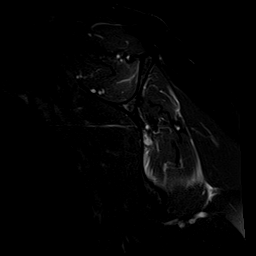
[im 4/24]
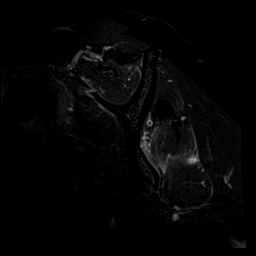
[im 7/24]
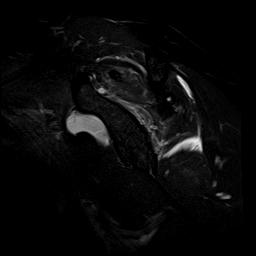
[im 10/24]
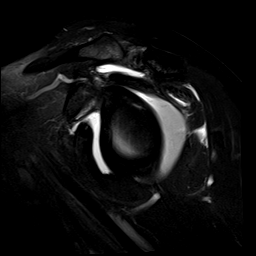
[im 14/24]
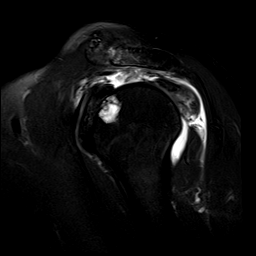
[im 17/24]
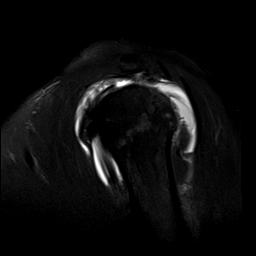
[im 20/24]
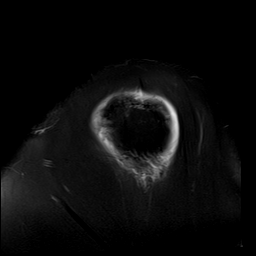
[im 24/24]
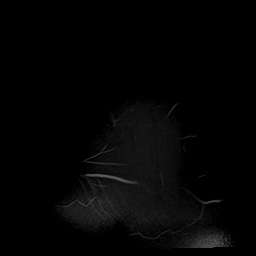

[40 of 40 positions shown; findings below may reference images not displayed]

FINDINGS: Rotator cuff: The supraspinatus and infraspinatus are completely
torn and retracted nearly to the glenoid, 4-5 cm. The subscapularis
and teres minor are intact.

Muscles: No atrophy or focal lesion.

Biceps long head: Intact. There is some intrasubstance increased T2
signal in the tendon consistent with tendinopathy.

Acromioclavicular Joint: Bulky osteoarthritis is present with marrow
edema about the joint. The acromion is type 2.

Glenohumeral Joint: Appears normal.

Labrum: Intact.

Bones: No fracture or worrisome lesion. Degenerative cysts in the
humeral head noted.
IMPRESSION: Complete supraspinatus and infraspinatus tendon tears with 4-5 cm of
retraction but no atrophy.

Long head of biceps tendinopathy without tear.

Bulky acromioclavicular osteoarthritis.

## 2020-12-08 ENCOUNTER — Ambulatory Visit: Payer: BC Managed Care – PPO | Admitting: Orthopedic Surgery

## 2020-12-15 ENCOUNTER — Institutional Professional Consult (permissible substitution): Payer: BC Managed Care – PPO | Admitting: Pulmonary Disease

## 2021-02-02 ENCOUNTER — Ambulatory Visit: Payer: BC Managed Care – PPO | Admitting: Cardiovascular Disease

## 2021-02-20 ENCOUNTER — Other Ambulatory Visit: Payer: Self-pay

## 2021-02-20 ENCOUNTER — Ambulatory Visit: Payer: Self-pay

## 2021-02-20 ENCOUNTER — Ambulatory Visit (INDEPENDENT_AMBULATORY_CARE_PROVIDER_SITE_OTHER): Payer: BC Managed Care – PPO | Admitting: Orthopedic Surgery

## 2021-02-20 DIAGNOSIS — M2021 Hallux rigidus, right foot: Secondary | ICD-10-CM | POA: Diagnosis not present

## 2021-02-20 DIAGNOSIS — M79671 Pain in right foot: Secondary | ICD-10-CM | POA: Diagnosis not present

## 2021-02-25 ENCOUNTER — Encounter: Payer: Self-pay | Admitting: Orthopedic Surgery

## 2021-02-25 NOTE — Progress Notes (Signed)
Office Visit Note   Patient: Louis Zimmerman           Date of Birth: 04/17/1961           MRN: [id number] Visit Date: 02/20/2021              Requested by: Louis Agreste, MD [address] [place],  [place] [address] PCP: Louis Agreste, MD  Chief Complaint  Patient presents with   Right Foot - Pain      HPI: Patient is a 60 year old gentleman who is seen in referral from Dr. Marlou Zimmerman.  Patient complains of bunion pain right great toe for 3 to 4 months.  He complains of redness and swelling he is unable to wear work boots due to the pressure.  He is on Neurontin for generalized pain negative history of diabetes but he states he is prediabetic.  He denies a history of gout.  Assessment & Plan: Visit Diagnoses:  1. Pain in right foot   2. Hallux rigidus, right foot     Plan: Discussed the nature of his hallux rigidus recommended a carbon fiber insert under his orthotics as well as a stiff soled sneaker or work boot.  Also discussed that a orthotic with metatarsal pad will help unload the great toe.  Discussed that the surgical treatment would be to proceed with a fusion of the great toe MTP joint.  Discussed the importance of smoking cessation to minimize risks of surgical complications.  Patient will call if he wishes to proceed with surgery.  Follow-Up Instructions: Return if symptoms worsen or fail to improve.   Ortho Exam  Patient is alert, oriented, no adenopathy, well-dressed, normal affect, normal respiratory effort. Examination patient has a good dorsalis pedis pulse he has circumferential spurring around the right great toe MTP joint.  There is pain with range of motion of the great toe MTP joint he has dorsiflexion of 20 degrees plantarflexion of 0 degrees.  He does have a fungal rash on his foot and recommended removal or alpaca socks.  His hemoglobin A1c has been around 5.8.  Imaging: No results found. No images are attached to the encounter.  Labs: Lab  Results  Component Value Date   HGBA1C 5.8 11/24/2020   HGBA1C 5.9 (H) 08/18/2020   HGBA1C 5.8 (H) 02/10/2020   ESRSEDRATE 6 06/26/2018   CRP <0.8 06/26/2018   LABURIC 6.1 04/28/2020   REPTSTATUS 07/01/2018 FINAL 06/26/2018   CULT  06/26/2018    NO GROWTH 5 DAYS Performed at [facility], [name] [address], [place], [place] [address]      Lab Results  Component Value Date   ALBUMIN 4.2 11/24/2020   ALBUMIN 4.5 02/10/2020   ALBUMIN 3.7 02/10/2019    Lab Results  Component Value Date   MG 2.3 06/26/2018   No results found for: VD25OH  No results found for: PREALBUMIN CBC EXTENDED Latest Ref Rng & Units 11/09/2020 08/18/2020 02/10/2019  WBC 4.0 - 10.5 K/uL 9.1 8.3 8.7  RBC 4.22 - 5.81 MIL/uL 4.19(L) 5.06 4.89  HGB 13.0 - 17.0 g/dL 13.2 15.5 15.3  HCT 39.0 - 52.0 % 38.3(L) 45.4 44.6  PLT 150 - 400 K/uL 304 342 362  NEUTROABS 1.7 - 7.7 K/uL - - -  LYMPHSABS 0.7 - 4.0 K/uL - - -     There is no height or weight on file to calculate BMI.  Orders:  Orders Placed This Encounter  Procedures  XR Foot Complete Right   No orders of the defined types were placed in this encounter.    Procedures: No procedures performed  Clinical Data: No additional findings.  ROS:  All other systems negative, except as noted in the HPI. Review of Systems  Objective: Vital Signs: There were no vitals taken for this visit.  Specialty Comments:  No specialty comments available.  PMFS History: Patient Active Problem List   Diagnosis Date Noted   Carpal tunnel syndrome, right upper limb    Biceps tendinitis of left upper extremity    Rotator cuff arthropathy 02/16/2019   FHx: colon cancer 12/18/2018   Vasculitis ([place]) 06/26/2018   AKI (acute kidney injury) ([place]) 06/26/2018   Herpetic dermatitis 02/19/2018   Attention deficit disorder (ADD) in adult 11/10/2017   Moderate episode of recurrent major depressive disorder ([place] Zimm[place]) 08/31/2017   Other mixed anxiety disorders  08/31/2017   Disturbed concentration 08/31/2017   Tobacco use disorder 08/31/2017   History of substance abuse ([place]) 02/23/2017   Depression 02/02/2013   Past Medical History:  Diagnosis Date   ADD (attention deficit disorder)    Anxiety    Arthritis    feet, shoulders   Cancer ([name])    skin cancer   COPD (chronic obstructive pulmonary disease) ([name])    Depression    Dyspnea    with exertion   Neuromuscular disorder ([name])    carpal tunnel   Pre-diabetes     Family History  Problem Relation Age of Onset   Cancer Mother    Dementia Mother    Diabetes Mother    Heart Problems Mother    Heart disease Mother    Liver cancer Father    Colon cancer Father    Alcohol abuse Maternal Grandfather    Alcohol abuse Paternal Grandfather    Colon polyps Sister    Colon polyps Sister    Esophageal cancer Neg Hx    Stomach cancer Neg Hx    Rectal cancer Neg Hx     Past Surgical History:  Procedure Laterality Date   CARPAL TUNNEL RELEASE Right 11/13/2020   Procedure: RIGHT CARPAL TUNNEL RELEASE;  Surgeon: Louis Pel, MD;  Location: [facility];  Service: Orthopedics;  Laterality: Right;   CHOLECYSTECTOMY     COLONOSCOPY     2000   GALLBLADDER SURGERY  2003   SHOULDER ARTHROSCOPY Left 02/16/2019   LEFT SHOULDER ARTHROSCOPY LOWER TRAPEZIUS TENDON TRANSFER, BICEPS TENODESIS   SHOULDER ARTHROSCOPY WITH DEBRIDEMENT AND BICEP TENDON REPAIR Left 02/16/2019   Procedure: LEFT SHOULDER ARTHROSCOPY LOWER TRAPEZIUS TENDON TRANSFER, BICEPS TENODESIS;  Surgeon: Louis Pel, MD;  Location: [facility];  Service: Orthopedics;  Laterality: Left;   SKIN BIOPSY  2019   Social History   Occupational History   Not on file  Tobacco Use   Smoking status: Every Day    Packs/day: 1.00    Years: 50.00    Pack years: 50.00    Types: Cigarettes   Smokeless tobacco: Never  Vaping Use   Vaping Use: Former  Substance and Sexual Activity   Alcohol use: No   Drug use: No     Frequency: 7.0 times per week    Types: IV    Comment: history of Rx opoid dependency   Sexual activity: Not Currently

## 2021-03-02 ENCOUNTER — Ambulatory Visit: Payer: BC Managed Care – PPO | Admitting: Family Medicine

## 2021-03-02 ENCOUNTER — Other Ambulatory Visit: Payer: Self-pay

## 2021-03-02 DIAGNOSIS — F988 Other specified behavioral and emotional disorders with onset usually occurring in childhood and adolescence: Secondary | ICD-10-CM

## 2021-03-02 DIAGNOSIS — N529 Male erectile dysfunction, unspecified: Secondary | ICD-10-CM | POA: Diagnosis not present

## 2021-03-02 DIAGNOSIS — E785 Hyperlipidemia, unspecified: Secondary | ICD-10-CM

## 2021-03-02 DIAGNOSIS — R202 Paresthesia of skin: Secondary | ICD-10-CM

## 2021-03-02 DIAGNOSIS — I700 Atherosclerosis of aorta: Secondary | ICD-10-CM

## 2021-03-02 DIAGNOSIS — F331 Major depressive disorder, recurrent, moderate: Secondary | ICD-10-CM | POA: Diagnosis not present

## 2021-03-02 DIAGNOSIS — Z5181 Encounter for therapeutic drug level monitoring: Secondary | ICD-10-CM | POA: Diagnosis not present

## 2021-03-02 LAB — COMPREHENSIVE METABOLIC PANEL
ALT: 24 U/L (ref 0–53)
AST: 20 U/L (ref 0–37)
Albumin: 4.2 g/dL (ref 3.5–5.2)
Alkaline Phosphatase: 95 U/L (ref 39–117)
BUN: 13 mg/dL (ref 6–23)
CO2: 28 mEq/L (ref 19–32)
Calcium: 10 mg/dL (ref 8.4–10.5)
Chloride: 101 mEq/L (ref 96–112)
Creatinine, Ser: 1.04 mg/dL (ref 0.40–1.50)
GFR: 78.71 mL/min (ref >60.00)
Glucose, Bld: 89 mg/dL (ref 70–99)
Potassium: 3.8 mEq/L (ref 3.5–5.1)
Sodium: 137 mEq/L (ref 135–145)
Total Bilirubin: 0.5 mg/dL (ref 0.2–1.2)
Total Protein: 6.7 g/dL (ref 6.0–8.3)

## 2021-03-02 LAB — LIPID PANEL
Cholesterol: 256 mg/dL — ABNORMAL HIGH (ref 0–200)
HDL: 35.7 mg/dL — ABNORMAL LOW (ref >39.00)
Total CHOL/HDL Ratio: 7
Triglycerides: 485 mg/dL — ABNORMAL HIGH (ref 0.0–149.0)

## 2021-03-02 LAB — LDL CHOLESTEROL, DIRECT: Direct LDL: 168 mg/dL

## 2021-03-02 MED ORDER — AMPHETAMINE-DEXTROAMPHETAMINE 15 MG PO TABS: 7.5000 mg | ORAL_TABLET | Freq: Two times a day (BID) | ORAL | 0 refills | Status: DC

## 2021-03-02 MED ORDER — CITALOPRAM HYDROBROMIDE 40 MG PO TABS: 40.0000 mg | ORAL_TABLET | Freq: Every day | ORAL | 1 refills | Status: DC

## 2021-03-02 NOTE — Patient Instructions (Addendum)
I will recheck labs, but would likely recommend statin medication given the plaque noted on CT scan in April. Can discuss with cardiology as well, but may need a calcium scoring test.  Can discuss further once I review your updated labs.  No other med changes at this time.  I will refer you to urology to discuss options as we discussed.  Let me know if there are questions.

## 2021-03-02 NOTE — Progress Notes (Signed)
Subjective:  Patient ID: Louis Zimmerman, male    DOB: [birth year]  Age: 60 y.o. MRN: [id number]  CC:  Chief Complaint  Patient presents with   ADHD    Pt is in need of refill for adderall no concerns today doing well   Depression    Pt is in need of refill for celexa, feels doing well no concerns at this time     HPI Louis Zimmerman presents for   Attention deficit disorder Treated with Adderall 15 mg, discussed in April, at that time was taking 1/2 pill twice per day Monday through Thursday, off on weekends.  Remain on same medications. Same dosing. Working ok. Focus doing well. No palpitations, appetite suppression or insomnia.  Controlled substance database (PDMP) reviewed. No concerns appreciated. Last filled 7/11, prior 12/30/20.  UDS today - denies IDU.  Has been taking adderall this week, and this am.    Hypokalemia Noted on labs in April.  Potassium rich foods discussed. No diet changes.  Lab Results  Component Value Date   K 3.8 03/02/2021    Hyperlipidemia: Elevated in April with 10-year ASCVD score 15%.  Planned diet/exercise changes with repeat testing in 3 to 6 months versus low-dose statin.  No current meds.  Drinking more water, limited exercise with foot issues. Cooking more at home for self. Open to coronary calcium scoring depending on levels, but did have Coronary atherosclerosis with calcified plaque in the distribution of the LAD on CT chest in April. Mild aortic atherosclerosis in 2019.  Appt with cardiology in October  No chest pains, prior shortness of breath improved.   Lab Results  Component Value Date   CHOL 256 (H) 03/02/2021   HDL 35.70 (L) 03/02/2021   LDLCALC 137 (H) 11/24/2020   LDLDIRECT 168.0 03/02/2021   TRIG (H) 03/02/2021    485.0 Triglyceride is over 400; calculations on Lipids are invalid.   CHOLHDL 7 03/02/2021   Lab Results  Component Value Date   ALT 24 03/02/2021   AST 20 03/02/2021   ALKPHOS 95 03/02/2021   BILITOT 0.5  03/02/2021   Erectile dysfunction: Decreased tumescence and difficulty maintaining erection. No relief with Viagra - took full pill and still no change. Agrees to urology referral.   Depression: Previous medications attempted.   ultimately treated with Celexa 40 mg daily.  Working well at last visit in April.  Stable today. Mood is stable.   Depression screen [facility] 2/9 03/02/2021 03/02/2021 11/24/2020 11/24/2020 09/20/2020  Decreased Interest 0 0 0 0 0  Down, Depressed, Hopeless 0 0 0 0 0  PHQ - 2 Score 0 0 0 0 0  Altered sleeping 0 - 0 - -  Tired, decreased energy 0 - 0 - -  Change in appetite 0 - 0 - -  Feeling bad or failure about yourself  0 - 0 - -  Trouble concentrating 0 - 0 - -  Moving slowly or fidgety/restless 0 - 0 - -  Suicidal thoughts 0 - 0 - -  PHQ-9 Score 0 - 0 - -  Difficult doing work/chores - - - - -  Some recent data might be hidden     History Patient Active Problem List   Diagnosis Date Noted   Carpal tunnel syndrome, right upper limb    Biceps tendinitis of left upper extremity    Rotator cuff arthropathy 02/16/2019   FHx: colon cancer 12/18/2018   Vasculitis ([place]) 06/26/2018   AKI (acute kidney injury) ([place])  06/26/2018   Herpetic dermatitis 02/19/2018   Attention deficit disorder (ADD) in adult 11/10/2017   Moderate episode of recurrent major depressive disorder ([place]) 08/31/2017   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/31/2017   Disturbed concentration 08/31/2017   Tobacco use disorder 08/31/2017   History of substance abuse ([place]) 02/23/2017   Depression 02/02/2013   Past Medical History:  Diagnosis Date   ADD (attention deficit disorder)    Anxiety    Arthritis    feet, shoulders   Cancer ([place])    skin cancer   COPD (chronic obstructive pulmonary disease) ([name])    Depression    Dyspnea    with exertion   Neuromuscular disorder ([place])    carpal tunnel   Pre-diabetes    Past Surgical History:  Procedure Laterality Date   CARPAL TUNNEL RELEASE Right  11/13/2020   Procedure: RIGHT CARPAL TUNNEL RELEASE;  Surgeon: Meredith Pel, MD;  Location: [facility];  Service: Orthopedics;  Laterality: Right;   CHOLECYSTECTOMY     COLONOSCOPY     2000   GALLBLADDER SURGERY  2003   SHOULDER ARTHROSCOPY Left 02/16/2019   LEFT SHOULDER ARTHROSCOPY LOWER TRAPEZIUS TENDON TRANSFER, BICEPS TENODESIS   SHOULDER ARTHROSCOPY WITH DEBRIDEMENT AND BICEP TENDON REPAIR Left 02/16/2019   Procedure: LEFT SHOULDER ARTHROSCOPY LOWER TRAPEZIUS TENDON TRANSFER, BICEPS TENODESIS;  Surgeon: Meredith Pel, MD;  Location: [facility];  Service: Orthopedics;  Laterality: Left;   SKIN BIOPSY  2019   Allergies  Allergen Reactions   Hydrocodone Anaphylaxis   Sertraline     sedating   Prior to Admission medications   Medication Sig Start Date End Date Taking? Authorizing Provider  albuterol (VENTOLIN HFA) 108 (90 Base) MCG/ACT inhaler Inhale 2 puffs into the lungs every 6 (six) hours as needed for wheezing or shortness of breath. 10/13/20   O'NealCassie Freer, MD  amphetamine-dextroamphetamine (ADDERALL) 15 MG tablet Take 0.5 tablets by mouth 2 (two) times daily. 11/24/20 12/24/20  [name], MD  amphetamine-dextroamphetamine (ADDERALL) 15 MG tablet Take 0.5 tablets by mouth 2 (two) times daily. 11/24/20   [name], MD  amphetamine-dextroamphetamine (ADDERALL) 15 MG tablet Take 0.5 tablets by mouth 2 (two) times daily. 11/24/20   [name], MD  aspirin EC 81 MG tablet Take 81 mg by mouth daily. Swallow whole.    [provider]  citalopram (CELEXA) 40 MG tablet Take 1 tablet (40 mg total) by mouth daily. 11/24/20   [name], MD  doxycycline (VIBRA-TABS) 100 MG tablet Take 1 tablet (100 mg total) by mouth 2 (two) times daily. 11/27/20   Meredith Pel, MD  gabapentin (NEURONTIN) 300 MG capsule Take 1 capsule (300 mg total) by mouth every morning AND 2 capsules (600 mg total) at bedtime. 11/24/20   [name],  MD  ivermectin (STROMECTOL) 3 MG TABS tablet Take 18 mg by mouth. Patient takes 6 tablets every two weeks.    [provider]  meloxicam (MOBIC) 15 MG tablet Take 1 tablet (15 mg total) by mouth daily. 08/20/20   Magnant, Charles L, PA-C  predniSONE (STERAPRED UNI-PAK 48 TAB) 10 MG (48) TBPK tablet SMARTSIG:2 By Mouth Twice Daily PRN 11/08/20   [provider]  traMADol (ULTRAM) 50 MG tablet Take 1 tablet (50 mg total) by mouth every 6 (six) hours as needed. 11/13/20 11/13/21  Magnant, Gerrianne Scale, PA-C   Social History   Socioeconomic History   Marital status: Single    Spouse name:  Not on file   Number of children: 3   Years of education: Not on file   Highest education level: Not on file  Occupational History   Not on file  Tobacco Use   Smoking status: Every Day    Packs/day: 1.00    Years: 50.00    Pack years: 50.00    Types: Cigarettes   Smokeless tobacco: Never  Vaping Use   Vaping Use: Former  Substance and Sexual Activity   Alcohol use: No   Drug use: No    Frequency: 7.0 times per week    Types: IV    Comment: history of Rx opoid dependency   Sexual activity: Not Currently  Other Topics Concern   Not on file  Social History Narrative   Not on file   Social Determinants of Health   Financial Resource Strain: Not on file  Food Insecurity: Not on file  Transportation Needs: Not on file  Physical Activity: Not on file  Stress: Not on file  Social Connections: Not on file  Intimate Partner Violence: Not on file    Review of Systems  Constitutional:  Negative for fatigue and unexpected weight change.  Eyes:  Negative for visual disturbance.  Respiratory:  Negative for cough, chest tightness and shortness of breath.   Cardiovascular:  Negative for chest pain, palpitations and leg swelling.  Gastrointestinal:  Negative for abdominal pain and blood in stool.  Neurological:  Negative for dizziness, light-headedness and headaches.    Objective:   There were no vitals filed for this visit.   Physical Exam Vitals reviewed.  Constitutional:      Appearance: He is well-developed.  HENT:     Head: Normocephalic and atraumatic.     Right Ear: External ear normal.     Left Ear: External ear normal.  Eyes:     Conjunctiva/sclera: Conjunctivae normal.     Pupils: Pupils are equal, round, and reactive to light.  Neck:     Thyroid: No thyromegaly.  Cardiovascular:     Rate and Rhythm: Normal rate and regular rhythm.     Heart sounds: Normal heart sounds.  Pulmonary:     Effort: Pulmonary effort is normal. No respiratory distress.     Breath sounds: Normal breath sounds. No wheezing.  Abdominal:     General: There is no distension.     Palpations: Abdomen is soft.     Tenderness: There is no abdominal tenderness.  Musculoskeletal:        General: No tenderness. Normal range of motion.     Cervical back: Normal range of motion and neck supple.  Lymphadenopathy:     Cervical: No cervical adenopathy.  Skin:    General: Skin is warm and dry.  Neurological:     Mental Status: He is alert and oriented to person, place, and time.     Deep Tendon Reflexes: Reflexes are normal and symmetric.  Psychiatric:        Behavior: Behavior normal.       Assessment & Plan:  Louis Zimmerman is a 60 y.o. male . Atherosclerosis of aorta ([name]) Hyperlipidemia, unspecified hyperlipidemia type - Plan: Comprehensive metabolic panel, Lipid panel  -Check labs, but discussed likely need for statin given atherosclerosis seen of aorta.  Option to also discuss with cardiology.  Attention deficit disorder (ADD) in adult - Plan: amphetamine-dextroamphetamine (ADDERALL) 15 MG tablet, amphetamine-dextroamphetamine (ADDERALL) 15 MG tablet, amphetamine-dextroamphetamine (ADDERALL) 15 MG tablet, DRUG MONITORING, PANEL 8 WITH CONFIRMATION, URINE  -  Stable symptoms controlled on current regimen, refills ordered, check urine drug screen.  Continue same  management.  Moderate episode of recurrent major depressive disorder ([place]) - Plan: citalopram (CELEXA) 40 MG tablet  -Stable symptoms, continue Celexa.  Erectile dysfunction, unspecified erectile dysfunction type - Plan: Ambulatory referral to Urology  -Minimal change in medications as above,  refer to urology to discuss other treatment options.  Medication monitoring encounter - Plan: DRUG MONITORING, PANEL 8 WITH CONFIRMATION, URINE   Meds ordered this encounter  Medications   amphetamine-dextroamphetamine (ADDERALL) 15 MG tablet    Sig: Take 0.5 tablets by mouth 2 (two) times daily.    Dispense:  30 tablet    Refill:  0    Fill in 30 days.   amphetamine-dextroamphetamine (ADDERALL) 15 MG tablet    Sig: Take 0.5 tablets by mouth 2 (two) times daily.    Dispense:  30 tablet    Refill:  0    Ok to fill in 60 days.   amphetamine-dextroamphetamine (ADDERALL) 15 MG tablet    Sig: Take 0.5 tablets by mouth 2 (two) times daily.    Dispense:  30 tablet    Refill:  0    Ok to fill 03/07/21   citalopram (CELEXA) 40 MG tablet    Sig: Take 1 tablet (40 mg total) by mouth daily.    Dispense:  90 tablet    Refill:  1   Patient Instructions  I will recheck labs, but would likely recommend statin medication given the plaque noted on CT scan in April. Can discuss with cardiology as well, but may need a calcium scoring test.  Can discuss further once I review your updated labs.  No other med changes at this time.  I will refer you to urology to discuss options as we discussed.  Let me know if there are questions.     Signed,   Merri Ray, MD [place], [facility] 03/06/21 9:58 PM

## 2021-03-04 LAB — DRUG MONITORING, PANEL 8 WITH CONFIRMATION, URINE
6 Acetylmorphine: NEGATIVE ng/mL (ref <10)
Alcohol Metabolites: NEGATIVE ng/mL (ref <500)
Amphetamine: 1441 ng/mL — ABNORMAL HIGH (ref <250)
Amphetamines: POSITIVE ng/mL — AB (ref <500)
Benzodiazepines: NEGATIVE ng/mL (ref <100)
Buprenorphine, Urine: NEGATIVE ng/mL (ref <5)
Cocaine Metabolite: NEGATIVE ng/mL (ref <150)
Creatinine: 109.7 mg/dL (ref >=20.0)
MDMA: NEGATIVE ng/mL (ref <500)
Marijuana Metabolite: NEGATIVE ng/mL (ref <20)
Methamphetamine: NEGATIVE ng/mL (ref <250)
Opiates: NEGATIVE ng/mL (ref <100)
Oxidant: NEGATIVE ug/mL (ref <200)
Oxycodone: NEGATIVE ng/mL (ref <100)
pH: 6.9 (ref 4.5–9.0)

## 2021-03-04 LAB — DM TEMPLATE

## 2021-03-06 ENCOUNTER — Encounter: Payer: Self-pay | Admitting: Family Medicine

## 2021-05-28 ENCOUNTER — Other Ambulatory Visit: Payer: Self-pay

## 2021-05-28 ENCOUNTER — Ambulatory Visit: Payer: BC Managed Care – PPO | Admitting: Cardiovascular Disease

## 2021-05-28 ENCOUNTER — Encounter: Payer: Self-pay | Admitting: Cardiovascular Disease

## 2021-05-28 VITALS — BP 124/66 | HR 83 | Ht 72.0 in | Wt 188.2 lb

## 2021-05-28 DIAGNOSIS — Z720 Tobacco use: Secondary | ICD-10-CM

## 2021-05-28 DIAGNOSIS — R0602 Shortness of breath: Secondary | ICD-10-CM

## 2021-05-28 DIAGNOSIS — I2510 Atherosclerotic heart disease of native coronary artery without angina pectoris: Secondary | ICD-10-CM

## 2021-05-28 DIAGNOSIS — E785 Hyperlipidemia, unspecified: Secondary | ICD-10-CM | POA: Diagnosis not present

## 2021-05-28 MED ORDER — ROSUVASTATIN CALCIUM 40 MG PO TABS: 40.0000 mg | ORAL_TABLET | Freq: Every day | ORAL | 3 refills | Status: DC

## 2021-05-28 MED ORDER — ASPIRIN EC 81 MG PO TBEC: 81.0000 mg | DELAYED_RELEASE_TABLET | Freq: Every day | ORAL | 3 refills | Status: AC

## 2021-05-28 NOTE — Progress Notes (Signed)
Cardiology Office Note:   Date:  05/28/2021  NAME:  Louis Zimmerman    MRN: [id number] DOB:  1960-08-03   PCP:  [name], MD  Cardiologist:  None  Electrophysiologist:  None   Referring MD: [name], MD   Chief Complaint  Patient presents with   Follow-up        History of Present Illness:   Louis Zimmerman is a 60 y.o. male with a hx of tobacco abuse who presents for follow-up. Seen earlier this year for SOB. Echo normal. PFTs with concerns for COPD. Coronary calcifications seen on chest CT. he reports his shortness of breath improved.  He still can get short of breath with heavy activity but symptoms have improved.  He does have COPD.  He missed his appointment with pulmonology.  He denies any chest pain or chest pressure.  No exertional chest symptoms are reported.  He reports no rapid heartbeat sensation.  We reviewed his CT scan.  This shows coronary calcifications.  Given his smoking history I recommended statin therapy.  He is okay to start this.  He also should be on aspirin.  He will start this as well.  BP 124/66.  He is still smoking 1 pack/day.  Apparently he had issues with Chantix in the past.  We discussed nicotine gum and patches.  I also relayed that it is very important for him to quit smoking.  He will work on this.  Lipid profiles been reviewed.  Not at goal.  Everything else appears to be stable.  No new chest symptoms.  Problem List 1. Tobacco abuse  -1-2 ppd 50 years  -mild COPD 2. Coronary calcifications on chest CT -11/08/2020 3. HLD -T chol 256, HDL 36, LDL 168, TG 485  Past Medical History: Past Medical History:  Diagnosis Date   ADD (attention deficit disorder)    Anxiety    Arthritis    feet, shoulders   Cancer ([name])    skin cancer   COPD (chronic obstructive pulmonary disease) ([name])    Depression    Dyspnea    with exertion   Neuromuscular disorder ([name])    carpal tunnel   Pre-diabetes     Past Surgical History: Past  Surgical History:  Procedure Laterality Date   CARPAL TUNNEL RELEASE Right 11/13/2020   Procedure: RIGHT CARPAL TUNNEL RELEASE;  Surgeon: Meredith Pel, MD;  Location: [facility];  Service: Orthopedics;  Laterality: Right;   CHOLECYSTECTOMY     COLONOSCOPY     2000   GALLBLADDER SURGERY  2003   SHOULDER ARTHROSCOPY Left 02/16/2019   LEFT SHOULDER ARTHROSCOPY LOWER TRAPEZIUS TENDON TRANSFER, BICEPS TENODESIS   SHOULDER ARTHROSCOPY WITH DEBRIDEMENT AND BICEP TENDON REPAIR Left 02/16/2019   Procedure: LEFT SHOULDER ARTHROSCOPY LOWER TRAPEZIUS TENDON TRANSFER, BICEPS TENODESIS;  Surgeon: Meredith Pel, MD;  Location: [facility];  Service: Orthopedics;  Laterality: Left;   SKIN BIOPSY  2019    Current Medications: Current Meds  Medication Sig   amphetamine-dextroamphetamine (ADDERALL) 15 MG tablet Take 0.5 tablets by mouth 2 (two) times daily.   amphetamine-dextroamphetamine (ADDERALL) 15 MG tablet Take 0.5 tablets by mouth 2 (two) times daily.   amphetamine-dextroamphetamine (ADDERALL) 15 MG tablet Take 0.5 tablets by mouth 2 (two) times daily.   citalopram (CELEXA) 40 MG tablet Take 1 tablet (40 mg total) by mouth daily.   doxycycline (VIBRA-TABS) 100 MG tablet Take 1 tablet (100 mg total) by mouth 2 (two) times daily.  gabapentin (NEURONTIN) 300 MG capsule Take 1 capsule (300 mg total) by mouth every morning AND 2 capsules (600 mg total) at bedtime.   ivermectin (STROMECTOL) 3 MG TABS tablet Take 18 mg by mouth. Patient takes 6 tablets every two weeks.   meloxicam (MOBIC) 15 MG tablet Take 1 tablet (15 mg total) by mouth daily.   rosuvastatin (CRESTOR) 40 MG tablet Take 1 tablet (40 mg total) by mouth daily.     Allergies:    Hydrocodone and Sertraline   Social History: Social History   Socioeconomic History   Marital status: Single    Spouse name: Not on file   Number of children: 3   Years of education: Not on file   Highest education level: Not on file   Occupational History   Not on file  Tobacco Use   Smoking status: Every Day    Packs/day: 1.00    Years: 50.00    Pack years: 50.00    Types: Cigarettes   Smokeless tobacco: Never  Vaping Use   Vaping Use: Former  Substance and Sexual Activity   Alcohol use: No   Drug use: No    Frequency: 7.0 times per week    Types: IV    Comment: history of Rx opoid dependency   Sexual activity: Not Currently  Other Topics Concern   Not on file  Social History Narrative   Not on file   Social Determinants of Health   Financial Resource Strain: Not on file  Food Insecurity: Not on file  Transportation Needs: Not on file  Physical Activity: Not on file  Stress: Not on file  Social Connections: Not on file     Family History: The patient's family history includes Alcohol abuse in his maternal grandfather and paternal grandfather; Cancer in his mother; Colon cancer in his father; Colon polyps in his sister and sister; Dementia in his mother; Diabetes in his mother; Heart Problems in his mother; Heart disease in his mother; Liver cancer in his father. There is no history of Esophageal cancer, Stomach cancer, or Rectal cancer.  ROS:   All other ROS reviewed and negative. Pertinent positives noted in the HPI.     EKGs/Labs/Other Studies Reviewed:   The following studies were personally reviewed by me today:  TTE 11/10/2020  1. Left ventricular ejection fraction, by estimation, is 60 to 65%. The  left ventricle has normal function. The left ventricle has no regional  wall motion abnormalities. There is mild concentric left ventricular  hypertrophy. Left ventricular diastolic  parameters are consistent with Grade I diastolic dysfunction (impaired  relaxation).   2. Right ventricular systolic function is normal. The right ventricular  size is normal.   3. The mitral valve is normal in structure. Trivial mitral valve  regurgitation.   4. The aortic valve is tricuspid. Aortic valve  regurgitation is not  visualized. No aortic stenosis is present.   5. Aortic dilatation noted. There is mild dilatation of the ascending  aorta, measuring 37 mm.   6. The inferior vena cava is normal in size with greater than 50%  respiratory variability, suggesting right atrial pressure of 3 mmHg.   Recent Labs: 08/18/2020: NT-Pro BNP 28 10/13/2020: TSH 3.460 11/09/2020: Hemoglobin 13.2; Platelets 304 03/02/2021: ALT 24; BUN 13; Creatinine, Ser 1.04; Potassium 3.8; Sodium 137   Recent Lipid Panel    Component Value Date/Time   CHOL 256 (H) 03/02/2021 1147   TRIG (H) 03/02/2021 1147    485.0 Triglyceride is  over 400; calculations on Lipids are invalid.   HDL 35.70 (L) 03/02/2021 1147   CHOLHDL 7 03/02/2021 1147   VLDL 32.6 11/24/2020 0950   LDLCALC 137 (H) 11/24/2020 0950   LDLDIRECT 168.0 03/02/2021 1147    Physical Exam:   VS:  BP 124/66 (BP Location: Left Arm, Patient Position: Sitting, Cuff Size: Large)   Pulse 83   Ht 6' (1.829 m)   Wt 188 lb 3.2 oz (85.4 kg)   SpO2 98%   BMI 25.52 kg/m    Wt Readings from Last 3 Encounters:  05/28/21 188 lb 3.2 oz (85.4 kg)  11/24/20 184 lb (83.5 kg)  11/17/20 184 lb (83.5 kg)    General: Well nourished, well developed, in no acute distress Head: Atraumatic, normal size  Eyes: PEERLA, EOMI  Neck: Supple, no JVD Endocrine: No thryomegaly Cardiac: Normal S1, S2; RRR; no murmurs, rubs, or gallops Lungs: Clear to auscultation bilaterally, no wheezing, rhonchi or rales  Abd: Soft, nontender, no hepatomegaly  Ext: No edema, pulses 2+ Musculoskeletal: No deformities, BUE and BLE strength normal and equal Skin: Warm and dry, no rashes   Neuro: Alert and oriented to person, place, time, and situation, CNII-XII grossly intact, no focal deficits  Psych: Normal mood and affect   ASSESSMENT:   Louis Zimmerman is a 60 y.o. male who presents for the following: 1. Coronary artery disease involving native coronary artery of native heart  without angina pectoris   2. Coronary artery calcification seen on CAT scan   3. SOB (shortness of breath) on exertion   4. Tobacco abuse   5. Hyperlipidemia, unspecified hyperlipidemia type     PLAN:   1. Coronary artery disease involving native coronary artery of native heart without angina pectoris 2. Coronary artery calcification seen on CAT scan -Recent chest CT shows evidence of coronary calcifications.  This is in the LAD distribution.  His shortness of breath has improved.  He describes no symptoms concerning for angina.  His cardiovascular examination is normal.  I have recommended aspirin 81 mg daily.  I have also recommended Crestor 40 mg daily.  His LDL cholesterol should be less than 70 and triglycerides less than 150.  We will have him come back in 3 months for follow-up testing.  This will include fasting lipid profile as well as direct LDL. -Blood pressure is well controlled. -We spent an extensive amount of time going over smoking cessation.  This is very important.  He will work on this.  3. SOB (shortness of breath) on exertion 4. Tobacco abuse -Suspect his symptoms of shortness of breath are related to emphysema.  He will work on smoking.  Nearly 7 minutes of smoking cessation counseling was provided in the office today.  I stressed to him the importance of smoking cessation.  He really needs to quit.  This will only further allow for plaque progression of his coronary disease.  He will work on this moving forward.      Disposition: Return in about 3 months (around 08/28/2021).  Medication Adjustments/Labs and Tests Ordered: Current medicines are reviewed at length with the patient today.  Concerns regarding medicines are outlined above.  Orders Placed This Encounter  Procedures   Lipid panel   LDL cholesterol, direct    Meds ordered this encounter  Medications   aspirin EC 81 MG tablet    Sig: Take 1 tablet (81 mg total) by mouth daily. Swallow whole.     Dispense:  30 tablet  Refill:  3   rosuvastatin (CRESTOR) 40 MG tablet    Sig: Take 1 tablet (40 mg total) by mouth daily.    Dispense:  90 tablet    Refill:  3     Patient Instructions  Medication Instructions:  Start Aspirin 81 mg daily Start Crestor 40 mg daily   *If you need a refill on your cardiac medications before your next appointment, please call your pharmacy*   Lab Work: LIPID, DIRECT LDL (come back 1 week before appointment in 3 months, no lab appointment needed, come fasting- nothing to eat or drink)   If you have labs (blood work) drawn today and your tests are completely normal, you will receive your results only by: [place] (if you have MyChart) OR A paper copy in the mail If you have any lab test that is abnormal or we need to change your treatment, we will call you to review the results.   Follow-Up: At [facility], you and your health needs are our priority.  As part of our continuing mission to provide you with exceptional heart care, we have created designated Provider Care Teams.  These Care Teams include your primary Cardiologist (physician) and Advanced Practice Providers (APPs -  Physician Assistants and Nurse Practitioners) who all work together to provide you with the care you need, when you need it.  We recommend signing up for the patient portal called "MyChart".  Sign up information is provided on this After Visit Summary.  MyChart is used to connect with patients for Virtual Visits (Telemedicine).  Patients are able to view lab/test results, encounter notes, upcoming appointments, etc.  Non-urgent messages can be sent to your provider as well.   To learn more about what you can do with MyChart, go to [email or web address].    Your next appointment:   3 month(s)  The format for your next appointment:   In Person  Provider:   You will see one of the following Advanced Practice Providers on your designated Care Team:   Almyra Deforest,  PA-C Sande Rives, [place]    Time Spent with Patient: I have spent a total of 35 minutes with patient reviewing hospital notes, telemetry, EKGs, labs and examining the patient as well as establishing an assessment and plan that was discussed with the patient.  > 50% of time was spent in direct patient care.  Signed, Addison Naegeli. Audie Box, MD, [place]  [address], [place], [place] [address] [phone]  05/28/2021 3:45 PM

## 2021-05-28 NOTE — Patient Instructions (Signed)
Medication Instructions:  Start Aspirin 81 mg daily Start Crestor 40 mg daily   *If you need a refill on your cardiac medications before your next appointment, please call your pharmacy*   Lab Work: LIPID, DIRECT LDL (come back 1 week before appointment in 3 months, no lab appointment needed, come fasting- nothing to eat or drink)   If you have labs (blood work) drawn today and your tests are completely normal, you will receive your results only by: [place] (if you have MyChart) OR A paper copy in the mail If you have any lab test that is abnormal or we need to change your treatment, we will call you to review the results.   Follow-Up: At [facility], you and your health needs are our priority.  As part of our continuing mission to provide you with exceptional heart care, we have created designated Provider Care Teams.  These Care Teams include your primary Cardiologist (physician) and Advanced Practice Providers (APPs -  Physician Assistants and Nurse Practitioners) who all work together to provide you with the care you need, when you need it.  We recommend signing up for the patient portal called "MyChart".  Sign up information is provided on this After Visit Summary.  MyChart is used to connect with patients for Virtual Visits (Telemedicine).  Patients are able to view lab/test results, encounter notes, upcoming appointments, etc.  Non-urgent messages can be sent to your provider as well.   To learn more about what you can do with MyChart, go to [email or web address].    Your next appointment:   3 month(s)  The format for your next appointment:   In Person  Provider:   You will see one of the following Advanced Practice Providers on your designated Care Team:   Almyra Deforest, PA-C Sande Rives, [place]

## 2021-06-01 ENCOUNTER — Other Ambulatory Visit: Payer: Self-pay

## 2021-06-01 ENCOUNTER — Encounter: Payer: Self-pay | Admitting: Family Medicine

## 2021-06-01 ENCOUNTER — Ambulatory Visit: Payer: BC Managed Care – PPO | Admitting: Family Medicine

## 2021-06-01 VITALS — BP 124/76 | HR 79 | Temp 98.3°F | Resp 16 | Ht 72.0 in | Wt 184.4 lb

## 2021-06-01 DIAGNOSIS — E785 Hyperlipidemia, unspecified: Secondary | ICD-10-CM

## 2021-06-01 DIAGNOSIS — M79671 Pain in right foot: Secondary | ICD-10-CM

## 2021-06-01 DIAGNOSIS — I700 Atherosclerosis of aorta: Secondary | ICD-10-CM | POA: Diagnosis not present

## 2021-06-01 DIAGNOSIS — M79672 Pain in left foot: Secondary | ICD-10-CM

## 2021-06-01 DIAGNOSIS — F988 Other specified behavioral and emotional disorders with onset usually occurring in childhood and adolescence: Secondary | ICD-10-CM

## 2021-06-01 DIAGNOSIS — F331 Major depressive disorder, recurrent, moderate: Secondary | ICD-10-CM

## 2021-06-01 DIAGNOSIS — A6000 Herpesviral infection of urogenital system, unspecified: Secondary | ICD-10-CM

## 2021-06-01 MED ORDER — AMPHETAMINE-DEXTROAMPHETAMINE 15 MG PO TABS: 7.5000 mg | ORAL_TABLET | Freq: Two times a day (BID) | ORAL | 0 refills | Status: DC

## 2021-06-01 MED ORDER — ACYCLOVIR 400 MG PO TABS: 400.0000 mg | ORAL_TABLET | Freq: Two times a day (BID) | ORAL | 1 refills | Status: DC

## 2021-06-01 NOTE — Patient Instructions (Addendum)
Try taking acyclovir twice per day every day to minimize chance of flares.  If you do have a flare in symptoms, increase to 2 pills twice per day for 5 days.  No other change in medication today.  Start cholesterol medicine as recommended by cardiology.  Follow-up in 3 months as planned.  I will refer you to podiatry to evaluate the foot pain.  Let me know if there are questions.

## 2021-06-01 NOTE — Progress Notes (Signed)
Subjective:  Patient ID: Louis Zimmerman, male    DOB: 02-20-1961  Age: 60 y.o. MRN: [id number]  CC:  Chief Complaint  Patient presents with   Hyperlipidemia    Pt was advised to start medication has not picked up yet,    ADHD    Pt due for refill adderall, no issues, been doing well,    Referral    Pt reports arthritis in his toes, would like to see podiatry for this    Medication Refill    Pt requesting refill acyclovir not refilled in several years due to infrequent use    Atherosclerosis of aorta     Discussed last OV 03/02/2021 noted on visit note today as well     HPI Louis Zimmerman presents for   Foot pain: Right more than left. For year. No injury. Pain in forefoot and 1st MTP. Pain under foot at times as well. Would like to see podiatry. Has been evaluated by foot ortho, recommended surgery for R foot - wants another opinion  No hx of gout  Taking ibuprofen 1200-1500mg  per day last few months.   Hyperlipidemia: Prior elevated labs with elevated ASCVD risk score.  Previously seen by cardiology, coronary atherosclerosis noted on CTA chest.  Started on Crestor by cardiology recently.has not yet started yet.   Lab Results  Component Value Date   CHOL 256 (H) 03/02/2021   HDL 35.70 (L) 03/02/2021   LDLCALC 137 (H) 11/24/2020   LDLDIRECT 168.0 03/02/2021   TRIG (H) 03/02/2021    485.0 Triglyceride is over 400; calculations on Lipids are invalid.   CHOLHDL 7 03/02/2021   Lab Results  Component Value Date   ALT 24 03/02/2021   AST 20 03/02/2021   ALKPHOS 95 03/02/2021   BILITOT 0.5 03/02/2021   Attention deficit disorder Adderall 15 mg 1/2 pill twice daily during work week - 4-5 days per week. Sometimes needed on weekends. .  Working well for focus.  denies new palpitations, appetite suppression or insomnia.  Controlled substance database reviewed.  Last prescription filled 05/07/2021.  UDS 03/03/2019  Depression: Depression screen [facility] 2/9 06/01/2021 03/02/2021  03/02/2021 11/24/2020 11/24/2020  Decreased Interest 0 0 0 0 0  Down, Depressed, Hopeless 0 0 0 0 0  PHQ - 2 Score 0 0 0 0 0  Altered sleeping - 0 - 0 -  Tired, decreased energy - 0 - 0 -  Change in appetite - 0 - 0 -  Feeling bad or failure about yourself  - 0 - 0 -  Trouble concentrating - 0 - 0 -  Moving slowly or fidgety/restless - 0 - 0 -  Suicidal thoughts - 0 - 0 -  PHQ-9 Score - 0 - 0 -  Difficult doing work/chores - - - - -  Some recent data might be hidden   Treated with Celexa 40 mg daily. Working well.   Hx of genital HSV: Last flare 1 month ago. 3-4 flares per year.  Taking acyclovir during flare. 400mg  BID for 3-4 days.  History Patient Active Problem List   Diagnosis Date Noted   Carpal tunnel syndrome, right upper limb    Biceps tendinitis of left upper extremity    Rotator cuff arthropathy 02/16/2019   FHx: colon cancer 12/18/2018   Vasculitis ([name]) 06/26/2018   AKI (acute kidney injury) ([place]) 06/26/2018   Herpetic dermatitis 02/19/2018   Attention deficit disorder (ADD) in adult 11/10/2017   Moderate episode of recurrent major depressive disorder (  [place]) 08/31/2017   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/31/2017   Disturbed concentration 08/31/2017   Tobacco use disorder 08/31/2017   History of substance abuse ([place]) 02/23/2017   Depression 02/02/2013   Past Medical History:  Diagnosis Date   ADD (attention deficit disorder)    Anxiety    Arthritis    feet, shoulders   Cancer ([place])    skin cancer   COPD (chronic obstructive pulmonary disease) ([name])    Depression    Dyspnea    with exertion   Neuromuscular disorder ([place])    carpal tunnel   Pre-diabetes    Past Surgical History:  Procedure Laterality Date   CARPAL TUNNEL RELEASE Right 11/13/2020   Procedure: RIGHT CARPAL TUNNEL RELEASE;  Surgeon: Meredith Pel, MD;  Location: [place];  Service: Orthopedics;  Laterality: Right;   CHOLECYSTECTOMY     COLONOSCOPY     2000    GALLBLADDER SURGERY  2003   SHOULDER ARTHROSCOPY Left 02/16/2019   LEFT SHOULDER ARTHROSCOPY LOWER TRAPEZIUS TENDON TRANSFER, BICEPS TENODESIS   SHOULDER ARTHROSCOPY WITH DEBRIDEMENT AND BICEP TENDON REPAIR Left 02/16/2019   Procedure: LEFT SHOULDER ARTHROSCOPY LOWER TRAPEZIUS TENDON TRANSFER, BICEPS TENODESIS;  Surgeon: Meredith Pel, MD;  Location: [place];  Service: Orthopedics;  Laterality: Left;   SKIN BIOPSY  2019   Allergies  Allergen Reactions   Hydrocodone Anaphylaxis   Sertraline     sedating   Prior to Admission medications   Medication Sig Start Date End Date Taking? Authorizing Provider  amphetamine-dextroamphetamine (ADDERALL) 15 MG tablet Take 0.5 tablets by mouth 2 (two) times daily. 03/02/21  Yes [name], MD  amphetamine-dextroamphetamine (ADDERALL) 15 MG tablet Take 0.5 tablets by mouth 2 (two) times daily. 03/02/21  Yes [name], MD  aspirin EC 81 MG tablet Take 1 tablet (81 mg total) by mouth daily. Swallow whole. 05/28/21  Yes O'Neal, Cassie Freer, MD  citalopram (CELEXA) 40 MG tablet Take 1 tablet (40 mg total) by mouth daily. 03/02/21  Yes [name], MD  doxycycline (VIBRA-TABS) 100 MG tablet Take 1 tablet (100 mg total) by mouth 2 (two) times daily. 11/27/20  Yes Meredith Pel, MD  gabapentin (NEURONTIN) 300 MG capsule Take 1 capsule (300 mg total) by mouth every morning AND 2 capsules (600 mg total) at bedtime. 11/24/20  Yes [name], MD  ivermectin (STROMECTOL) 3 MG TABS tablet Take 18 mg by mouth. Patient takes 6 tablets every two weeks.   Yes [provider]  meloxicam (MOBIC) 15 MG tablet Take 1 tablet (15 mg total) by mouth daily. 08/20/20  Yes Magnant, Charles L, PA-C  rosuvastatin (CRESTOR) 40 MG tablet Take 1 tablet (40 mg total) by mouth daily. 05/28/21 08/26/21 Yes O'Neal, Cassie Freer, MD  amphetamine-dextroamphetamine (ADDERALL) 15 MG tablet Take 0.5 tablets by mouth 2 (two) times daily. 03/02/21 05/28/21  [name], MD   Social History   Socioeconomic History   Marital status: Single    Spouse name: Not on file   Number of children: 3   Years of education: Not on file   Highest education level: Not on file  Occupational History   Not on file  Tobacco Use   Smoking status: Every Day    Packs/day: 1.00    Years: 50.00    Pack years: 50.00    Types: Cigarettes   Smokeless tobacco: Never  Vaping Use   Vaping Use: Former  Substance and Sexual Activity  Alcohol use: No   Drug use: No    Frequency: 7.0 times per week    Types: IV    Comment: history of Rx opoid dependency   Sexual activity: Not Currently  Other Topics Concern   Not on file  Social History Narrative   Not on file   Social Determinants of Health   Financial Resource Strain: Not on file  Food Insecurity: Not on file  Transportation Needs: Not on file  Physical Activity: Not on file  Stress: Not on file  Social Connections: Not on file  Intimate Partner Violence: Not on file    Review of Systems  Constitutional:  Negative for fatigue and unexpected weight change.  Eyes:  Negative for visual disturbance.  Respiratory:  Negative for cough, chest tightness and shortness of breath.   Cardiovascular:  Negative for chest pain, palpitations and leg swelling.  Gastrointestinal:  Negative for abdominal pain and blood in stool.  Neurological:  Negative for dizziness, light-headedness and headaches.    Objective:   Vitals:   06/01/21 1027  BP: 124/76  Pulse: 79  Resp: 16  Temp: 98.3 F (36.8 C)  TempSrc: Temporal  SpO2: 96%  Weight: 184 lb 6.4 oz (83.6 kg)  Height: 6' (1.829 m)     Physical Exam Vitals reviewed.  Constitutional:      Appearance: He is well-developed.  HENT:     Head: Normocephalic and atraumatic.  Neck:     Vascular: No carotid bruit or JVD.  Cardiovascular:     Rate and Rhythm: Normal rate and regular rhythm.     Heart sounds: Normal heart sounds. No murmur heard. Pulmonary:      Effort: Pulmonary effort is normal.     Breath sounds: Normal breath sounds. No rales.  Musculoskeletal:     Right lower leg: No edema.     Left lower leg: No edema.     Comments: Bony prominence with tender to palpation over the right first MTP, hallux valgus deformity.  Skin intact.  Bony prominence of the left first MTP.  No other focal bony tenderness of the feet.   Skin:    General: Skin is warm and dry.  Neurological:     Mental Status: He is alert and oriented to person, place, and time.  Psychiatric:        Mood and Affect: Mood normal.      Assessment & Plan:  Louis Zimmerman is a 60 y.o. male . Atherosclerosis of aorta ([name]) Hyperlipidemia, unspecified hyperlipidemia type  -Plan is to start a statin that was recently prescribed by cardiology.  Attention deficit disorder (ADD) in adult - Plan: amphetamine-dextroamphetamine (ADDERALL) 15 MG tablet, amphetamine-dextroamphetamine (ADDERALL) 15 MG tablet, amphetamine-dextroamphetamine (ADDERALL) 15 MG tablet  -Overall stable symptoms with current med regimen.  Continue same.  Next 77-month refills provided, call in 3 months and if stable additional 3 months prescription with 6 months follow-up  Bilateral foot pain - Plan: Ambulatory referral to Podiatry  -Suspect component of degenerative changes within MTP joint along with hallux valgus deformity.  Would like second opinion with podiatry, referral placed.  Moderate episode of recurrent major depressive disorder ([name])  -Stable with Celexa, continue same dose  Genital herpes simplex, unspecified site  -Start daily acyclovir 400 mg twice daily for suppression/prevention, increase to 800 mg twice daily x5 days if flare.  Meds ordered this encounter  Medications   acyclovir (ZOVIRAX) 400 MG tablet    Sig: Take 1 tablet (400 mg  total) by mouth 2 (two) times daily. Increase to 2 tabs BID for 5 days if flare.    Dispense:  180 tablet    Refill:  1    amphetamine-dextroamphetamine (ADDERALL) 15 MG tablet    Sig: Take 0.5 tablets by mouth 2 (two) times daily.    Dispense:  30 tablet    Refill:  0    Fill in 30 days.   amphetamine-dextroamphetamine (ADDERALL) 15 MG tablet    Sig: Take 0.5 tablets by mouth 2 (two) times daily.    Dispense:  30 tablet    Refill:  0    Ok to fill in 60 days.   amphetamine-dextroamphetamine (ADDERALL) 15 MG tablet    Sig: Take 0.5 tablets by mouth 2 (two) times daily.    Dispense:  30 tablet    Refill:  0    Ok to fill 06/07/21   Patient Instructions  Try taking acyclovir twice per day every day to minimize chance of flares.  If you do have a flare in symptoms, increase to 2 pills twice per day for 5 days.  No other change in medication today.  Start cholesterol medicine as recommended by cardiology.  Follow-up in 3 months as planned.  I will refer you to podiatry to evaluate the foot pain.  Let me know if there are questions.      Signed,   Merri Ray, MD [place] 06/01/21 4:32 PM

## 2021-06-08 DIAGNOSIS — N5201 Erectile dysfunction due to arterial insufficiency: Secondary | ICD-10-CM | POA: Diagnosis not present

## 2021-06-08 DIAGNOSIS — R351 Nocturia: Secondary | ICD-10-CM | POA: Diagnosis not present

## 2021-06-08 DIAGNOSIS — R3915 Urgency of urination: Secondary | ICD-10-CM | POA: Diagnosis not present

## 2021-06-25 ENCOUNTER — Other Ambulatory Visit: Payer: Self-pay

## 2021-06-25 ENCOUNTER — Ambulatory Visit (INDEPENDENT_AMBULATORY_CARE_PROVIDER_SITE_OTHER): Payer: BC Managed Care – PPO

## 2021-06-25 ENCOUNTER — Ambulatory Visit: Payer: BC Managed Care – PPO | Admitting: Podiatry

## 2021-06-25 DIAGNOSIS — M21621 Bunionette of right foot: Secondary | ICD-10-CM

## 2021-06-25 DIAGNOSIS — M205X1 Other deformities of toe(s) (acquired), right foot: Secondary | ICD-10-CM

## 2021-06-25 DIAGNOSIS — M205X2 Other deformities of toe(s) (acquired), left foot: Secondary | ICD-10-CM | POA: Diagnosis not present

## 2021-06-25 DIAGNOSIS — M79672 Pain in left foot: Secondary | ICD-10-CM | POA: Diagnosis not present

## 2021-06-25 DIAGNOSIS — M79671 Pain in right foot: Secondary | ICD-10-CM | POA: Diagnosis not present

## 2021-06-25 DIAGNOSIS — M7751 Other enthesopathy of right foot: Secondary | ICD-10-CM | POA: Diagnosis not present

## 2021-06-25 DIAGNOSIS — M7752 Other enthesopathy of left foot: Secondary | ICD-10-CM

## 2021-06-25 MED ORDER — DICLOFENAC SODIUM 75 MG PO TBEC: 75.0000 mg | DELAYED_RELEASE_TABLET | Freq: Two times a day (BID) | ORAL | 1 refills | Status: DC

## 2021-06-25 NOTE — Progress Notes (Signed)
   HPI: 60 y.o. male presenting today as a new patient for second opinion regarding pain and tenderness to the patient's bilateral feet this been going on for several months.  He has seen orthopedics in the past who recommended arthrodesis of the great toe joint.  He presents for further treatment and evaluation.  Past Medical History:  Diagnosis Date   ADD (attention deficit disorder)    Anxiety    Arthritis    feet, shoulders   Cancer ([name])    skin cancer   COPD (chronic obstructive pulmonary disease) ([name])    Depression    Dyspnea    with exertion   Neuromuscular disorder ([name])    carpal tunnel   Pre-diabetes      Physical Exam: General: The patient is alert and oriented x3 in no acute distress.  Dermatology: Skin is warm, dry and supple bilateral lower extremities. Negative for open lesions or macerations.  Vascular: Palpable pedal pulses bilaterally. No edema or erythema noted. Capillary refill within normal limits.  Neurological: Epicritic and protective threshold grossly intact bilaterally.   Musculoskeletal Exam: Range of motion within normal limits to all pedal and ankle joints bilateral. Muscle strength 5/5 in all groups bilateral.  Clinical evidence of a hallux limitus with enlargement of the first MTP joint and lateral deviation of the fifth metatarsal head consistent with a tailor's bunion deformity  Radiographic Exam:  Normal osseous mineralization.  Advanced degenerative changes with periarticular spurring noted to the first MTP joint bilateral.  Increased intermetatarsal space between the fourth and fifth metatarsals with lateral deviation of the fifth metatarsal head noted to the right foot  Assessment: 1.  Degenerative hallux limitus bilateral 2.  First MTP capsulitis bilateral 3.  Tailor's bunion right   Plan of Care:  1. Patient evaluated. X-Rays reviewed.  2.  Injection of 0.5 cc Celestone Soluspan injection of the first MTP bilateral 3.  Prescription  for diclofenac 75 mg 2 times daily as needed 4.  Recommend good supportive shoes that support the arches of the foot and limit motion of the forefoot 5.  Today we did discuss different surgical options given the advanced degenerative changes to the first MTP joint.  Patient would do well with first MTP implants versus arthrodesis.  Pros and cons of each were discussed 6.  Return to clinic as needed  *Recruitment consultant     Edrick Kins, DPM [facility]  Dr. Edrick Kins, DPM    [address], [place] [address]                Office [phone]  Fax [phone]

## 2021-07-02 DIAGNOSIS — M205X1 Other deformities of toe(s) (acquired), right foot: Secondary | ICD-10-CM | POA: Diagnosis not present

## 2021-07-02 DIAGNOSIS — M7752 Other enthesopathy of left foot: Secondary | ICD-10-CM | POA: Diagnosis not present

## 2021-07-02 DIAGNOSIS — M7751 Other enthesopathy of right foot: Secondary | ICD-10-CM | POA: Diagnosis not present

## 2021-07-02 MED ORDER — BETAMETHASONE SOD PHOS & ACET 6 (3-3) MG/ML IJ SUSP
3.0000 mg | Freq: Once | INTRAMUSCULAR | Status: AC
  Administered 2021-07-02: 3 mg via INTRA_ARTICULAR

## 2021-08-27 DIAGNOSIS — M71342 Other bursal cyst, left hand: Secondary | ICD-10-CM | POA: Diagnosis not present

## 2021-08-27 DIAGNOSIS — L814 Other melanin hyperpigmentation: Secondary | ICD-10-CM | POA: Diagnosis not present

## 2021-08-27 DIAGNOSIS — L570 Actinic keratosis: Secondary | ICD-10-CM | POA: Diagnosis not present

## 2021-08-27 DIAGNOSIS — D1801 Hemangioma of skin and subcutaneous tissue: Secondary | ICD-10-CM | POA: Diagnosis not present

## 2021-08-27 DIAGNOSIS — L568 Other specified acute skin changes due to ultraviolet radiation: Secondary | ICD-10-CM | POA: Diagnosis not present

## 2021-09-03 ENCOUNTER — Encounter: Payer: Self-pay | Admitting: Family Medicine

## 2021-09-03 ENCOUNTER — Ambulatory Visit: Payer: BC Managed Care – PPO | Admitting: Family Medicine

## 2021-09-03 VITALS — BP 132/70 | HR 73 | Temp 98.1°F | Resp 16 | Ht 72.0 in | Wt 184.8 lb

## 2021-09-03 DIAGNOSIS — F331 Major depressive disorder, recurrent, moderate: Secondary | ICD-10-CM | POA: Diagnosis not present

## 2021-09-03 DIAGNOSIS — F988 Other specified behavioral and emotional disorders with onset usually occurring in childhood and adolescence: Secondary | ICD-10-CM

## 2021-09-03 DIAGNOSIS — R202 Paresthesia of skin: Secondary | ICD-10-CM | POA: Diagnosis not present

## 2021-09-03 DIAGNOSIS — E785 Hyperlipidemia, unspecified: Secondary | ICD-10-CM

## 2021-09-03 DIAGNOSIS — F17210 Nicotine dependence, cigarettes, uncomplicated: Secondary | ICD-10-CM | POA: Diagnosis not present

## 2021-09-03 LAB — COMPREHENSIVE METABOLIC PANEL
ALT: 24 U/L (ref 0–53)
AST: 19 U/L (ref 0–37)
Albumin: 4.4 g/dL (ref 3.5–5.2)
Alkaline Phosphatase: 79 U/L (ref 39–117)
BUN: 14 mg/dL (ref 6–23)
CO2: 27 mEq/L (ref 19–32)
Calcium: 9.3 mg/dL (ref 8.4–10.5)
Chloride: 105 mEq/L (ref 96–112)
Creatinine, Ser: 0.92 mg/dL (ref 0.40–1.50)
GFR: 90.86 mL/min (ref >60.00)
Glucose, Bld: 90 mg/dL (ref 70–99)
Potassium: 3.6 mEq/L (ref 3.5–5.1)
Sodium: 139 mEq/L (ref 135–145)
Total Bilirubin: 0.5 mg/dL (ref 0.2–1.2)
Total Protein: 6.7 g/dL (ref 6.0–8.3)

## 2021-09-03 LAB — LIPID PANEL
Cholesterol: 257 mg/dL — ABNORMAL HIGH (ref 0–200)
HDL: 32.4 mg/dL — ABNORMAL LOW (ref >39.00)
NonHDL: 225.06
Total CHOL/HDL Ratio: 8
Triglycerides: 257 mg/dL — ABNORMAL HIGH (ref 0.0–149.0)
VLDL: 51.4 mg/dL — ABNORMAL HIGH (ref 0.0–40.0)

## 2021-09-03 LAB — LDL CHOLESTEROL, DIRECT: Direct LDL: 195 mg/dL

## 2021-09-03 MED ORDER — VARENICLINE TARTRATE 0.5 MG PO TABS: ORAL_TABLET | ORAL | 0 refills | Status: DC

## 2021-09-03 MED ORDER — CITALOPRAM HYDROBROMIDE 40 MG PO TABS: 40.0000 mg | ORAL_TABLET | Freq: Every day | ORAL | 1 refills | Status: DC

## 2021-09-03 MED ORDER — AMPHETAMINE-DEXTROAMPHETAMINE 15 MG PO TABS: 15.0000 mg | ORAL_TABLET | Freq: Every day | ORAL | 0 refills | Status: DC

## 2021-09-03 MED ORDER — GABAPENTIN 300 MG PO CAPS: ORAL_CAPSULE | ORAL | 1 refills | Status: DC

## 2021-09-03 MED ORDER — VARENICLINE TARTRATE 1 MG PO TABS: 1.0000 mg | ORAL_TABLET | Freq: Two times a day (BID) | ORAL | 2 refills | Status: DC

## 2021-09-03 NOTE — Progress Notes (Signed)
Subjective:  Patient ID: Louis Zimmerman, male    DOB: 12-07-1960  Age: 61 y.o. MRN: [id number]  CC:  Chief Complaint  Patient presents with   ADHD    Pt due fore refills still works okay reports thinks he got an expired Rx recently due to "rubberyness" to his most recent tablets     HPI Louis Zimmerman presents for   Attention deficit disorder: Last discussed November 4.  Adderall 15 mg 1/2 pill twice daily during the workweek, sometimes on weekends at that time and was working well. Different pill Rx recently - now taking whole pill - 1/2 pill not working. Take 1/2 pill in afternoon 2-3 days per week. Last filled #30 on 08/09/21. Few pills left. Seems to work better at this dose. Takes Celexa for depression.  Working well.  Mood stable. No SI/HI.  Depression screen [facility] 2/9 09/03/2021 06/01/2021 03/02/2021 03/02/2021 11/24/2020  Decreased Interest 0 0 0 0 0  Down, Depressed, Hopeless 0 0 0 0 0  PHQ - 2 Score 0 0 0 0 0  Altered sleeping 0 - 0 - 0  Tired, decreased energy 0 - 0 - 0  Change in appetite 0 - 0 - 0  Feeling bad or failure about yourself  0 - 0 - 0  Trouble concentrating 1 - 0 - 0  Moving slowly or fidgety/restless 1 - 0 - 0  Suicidal thoughts 0 - 0 - 0  PHQ-9 Score 2 - 0 - 0  Difficult doing work/chores - - - - -  Some recent data might be hidden   On gabapentin - arthritis, chroic shoulder pains and toe parasthesias. 300mg . 1 in am, 2 at bedtime. Working at that dose.   Nicotine addiction: 1.5- 2 packs per day. Would like to try chantix for cessation.  Last attempt in 2006, took Chantix. Tolerated at that time. Possible side effects discussed. Vivid dreams tolerated.   Hyperlipidemia: Prior elevated labs with elevated ASCVD risk score.  Previously seen by cardiology, coronary atherosclerosis noted on CTA chest.  Started on Crestor by cardiology - did not start.  Changed diet. Would liek to see new labs. No new exercise.  No chest pain/dyspnea or new symptoms   Lab  Results  Component Value Date   CHOL 257 (H) 09/03/2021   HDL 32.40 (L) 09/03/2021   LDLCALC 137 (H) 11/24/2020   LDLDIRECT 195.0 09/03/2021   TRIG 257.0 (H) 09/03/2021   CHOLHDL 8 09/03/2021   Lab Results  Component Value Date   ALT 24 09/03/2021   AST 19 09/03/2021   ALKPHOS 79 09/03/2021   BILITOT 0.5 09/03/2021         History Patient Active Problem List   Diagnosis Date Noted   Carpal tunnel syndrome, right upper limb    Biceps tendinitis of left upper extremity    Rotator cuff arthropathy 02/16/2019   FHx: colon cancer 12/18/2018   Vasculitis ([place]) 06/26/2018   AKI (acute kidney injury) ([place]) 06/26/2018   Herpetic dermatitis 02/19/2018   Attention deficit disorder (ADD) in adult 11/10/2017   Moderate episode of recurrent major depressive disorder ([place]) 08/31/2017   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/31/2017   Disturbed concentration 08/31/2017   Tobacco use disorder 08/31/2017   History of substance abuse ([place]) 02/23/2017   Depression 02/02/2013   Past Medical History:  Diagnosis Date   ADD (attention deficit disorder)    Anxiety    Arthritis    feet, shoulders   Cancer ([place])  skin cancer   COPD (chronic obstructive pulmonary disease) ([name])    Depression    Dyspnea    with exertion   Neuromuscular disorder ([name])    carpal tunnel   Pre-diabetes    Past Surgical History:  Procedure Laterality Date   CARPAL TUNNEL RELEASE Right 11/13/2020   Procedure: RIGHT CARPAL TUNNEL RELEASE;  Surgeon: Meredith Pel, MD;  Location: [place];  Service: Orthopedics;  Laterality: Right;   CHOLECYSTECTOMY     COLONOSCOPY     2000   GALLBLADDER SURGERY  2003   SHOULDER ARTHROSCOPY Left 02/16/2019   LEFT SHOULDER ARTHROSCOPY LOWER TRAPEZIUS TENDON TRANSFER, BICEPS TENODESIS   SHOULDER ARTHROSCOPY WITH DEBRIDEMENT AND BICEP TENDON REPAIR Left 02/16/2019   Procedure: LEFT SHOULDER ARTHROSCOPY LOWER TRAPEZIUS TENDON TRANSFER, BICEPS TENODESIS;   Surgeon: Meredith Pel, MD;  Location: [place];  Service: Orthopedics;  Laterality: Left;   SKIN BIOPSY  2019   Allergies  Allergen Reactions   Hydrocodone Anaphylaxis   Sertraline     sedating   Prior to Admission medications   Medication Sig Start Date End Date Taking? Authorizing Provider  acyclovir (ZOVIRAX) 400 MG tablet Take 1 tablet (400 mg total) by mouth 2 (two) times daily. Increase to 2 tabs BID for 5 days if flare. 06/01/21  Yes [name], MD  amphetamine-dextroamphetamine (ADDERALL) 15 MG tablet Take 0.5 tablets by mouth 2 (two) times daily. 06/01/21  Yes [name], MD  amphetamine-dextroamphetamine (ADDERALL) 15 MG tablet Take 0.5 tablets by mouth 2 (two) times daily. 06/01/21  Yes [name], MD  aspirin EC 81 MG tablet Take 1 tablet (81 mg total) by mouth daily. Swallow whole. 05/28/21  Yes O'Neal, Cassie Freer, MD  citalopram (CELEXA) 40 MG tablet Take 1 tablet (40 mg total) by mouth daily. 03/02/21  Yes [name], MD  diclofenac (VOLTAREN) 75 MG EC tablet Take 1 tablet (75 mg total) by mouth 2 (two) times daily. 06/25/21  Yes Edrick Kins, DPM  doxycycline (VIBRA-TABS) 100 MG tablet Take 1 tablet (100 mg total) by mouth 2 (two) times daily. 11/27/20  Yes Meredith Pel, MD  gabapentin (NEURONTIN) 300 MG capsule Take 1 capsule (300 mg total) by mouth every morning AND 2 capsules (600 mg total) at bedtime. 11/24/20  Yes [name], MD  ivermectin (STROMECTOL) 3 MG TABS tablet Take 18 mg by mouth. Patient takes 6 tablets every two weeks.   Yes [provider]  meloxicam (MOBIC) 15 MG tablet Take 1 tablet (15 mg total) by mouth daily. 08/20/20  Yes Magnant, Charles L, PA-C  amphetamine-dextroamphetamine (ADDERALL) 15 MG tablet Take 0.5 tablets by mouth 2 (two) times daily. 06/01/21 07/01/21  [name], MD  rosuvastatin (CRESTOR) 40 MG tablet Take 1 tablet (40 mg total) by mouth daily. 05/28/21 08/26/21  O'Neal, Cassie Freer,  MD   Social History   Socioeconomic History   Marital status: Single    Spouse name: Not on file   Number of children: 3   Years of education: Not on file   Highest education level: Not on file  Occupational History   Not on file  Tobacco Use   Smoking status: Every Day    Packs/day: 1.00    Years: 50.00    Pack years: 50.00    Types: Cigarettes   Smokeless tobacco: Never  Vaping Use   Vaping Use: Former  Substance and Sexual Activity   Alcohol use: No  Drug use: No    Frequency: 7.0 times per week    Types: IV    Comment: history of Rx opoid dependency   Sexual activity: Not Currently  Other Topics Concern   Not on file  Social History Narrative   Not on file   Social Determinants of Health   Financial Resource Strain: Not on file  Food Insecurity: Not on file  Transportation Needs: Not on file  Physical Activity: Not on file  Stress: Not on file  Social Connections: Not on file  Intimate Partner Violence: Not on file    Review of Systems Per ROS  Objective:   Vitals:   09/03/21 1013  BP: 132/70  Pulse: 73  Resp: 16  Temp: 98.1 F (36.7 C)  TempSrc: Temporal  SpO2: 98%  Weight: 184 lb 12.8 oz (83.8 kg)  Height: 6' (1.829 m)     Physical Exam Vitals reviewed.  Constitutional:      Appearance: He is well-developed.  HENT:     Head: Normocephalic and atraumatic.  Neck:     Vascular: No carotid bruit or JVD.  Cardiovascular:     Rate and Rhythm: Normal rate and regular rhythm.     Heart sounds: Normal heart sounds. No murmur heard. Pulmonary:     Effort: Pulmonary effort is normal.     Breath sounds: Normal breath sounds. No rales.  Musculoskeletal:     Right lower leg: No edema.     Left lower leg: No edema.  Skin:    General: Skin is warm and dry.  Neurological:     Mental Status: He is alert and oriented to person, place, and time.  Psychiatric:        Mood and Affect: Mood normal.        Behavior: Behavior normal.        Assessment & Plan:  Louis Zimmerman is a 61 y.o. male . Attention deficit disorder (ADD) in adult - Plan: amphetamine-dextroamphetamine (ADDERALL) 15 MG tablet, amphetamine-dextroamphetamine (ADDERALL) 15 MG tablet, amphetamine-dextroamphetamine (ADDERALL) 15 MG tablet, DISCONTINUED: amphetamine-dextroamphetamine (ADDERALL) 15 MG tablet  -Decreased control on 1/2 tablet, dosage adjusted to 15 mg every morning with option of half tablet in afternoon.  #45 prescription.  Recheck 3 months.  Paresthesia of foot, bilateral - Plan: gabapentin (NEURONTIN) 300 MG capsule  -Stable, continue same dose of gabapentin.  Refilled.  Moderate episode of recurrent major depressive disorder ([place]) - Plan: citalopram (CELEXA) 40 MG tablet  -Overall stable symptoms, continue Celexa same dose.  Cigarette nicotine dependence without complication - Plan: varenicline (CHANTIX CONTINUING MONTH PAK) 1 MG tablet, varenicline (CHANTIX) 0.5 MG tablet  -Cessation discussed.  Potential side effects and risk including psychiatric risks of Chantix discussed.  Understanding expressed.  Starter pack and continuing month pack, with handout given on smoking cessation.  RTC precautions.  Hyperlipidemia, unspecified hyperlipidemia type - Plan: Comprehensive metabolic panel, Lipid panel  -Recheck lipid panel but would recommend starting statin as previously prescribed if elevated readings.  Cardiac risk factors including tobacco use discussed.  Commended on decision to quit smoking.  Meds ordered this encounter  Medications   gabapentin (NEURONTIN) 300 MG capsule    Sig: Take 1 capsule (300 mg total) by mouth every morning AND 2 capsules (600 mg total) at bedtime.    Dispense:  270 capsule    Refill:  1   citalopram (CELEXA) 40 MG tablet    Sig: Take 1 tablet (40 mg total) by mouth daily.  Dispense:  90 tablet    Refill:  1   amphetamine-dextroamphetamine (ADDERALL) 15 MG tablet    Sig: Take 1 tablet by mouth  daily. With additional 1/2 tab in afternoon if needed.    Dispense:  45 tablet    Refill:  0    Ok to fill in 60 days.   amphetamine-dextroamphetamine (ADDERALL) 15 MG tablet    Sig: Take 1 tablet by mouth daily. With additional half tab in afternoon if needed.    Dispense:  45 tablet    Refill:  0    Fill in 30 days.   DISCONTD: amphetamine-dextroamphetamine (ADDERALL) 15 MG tablet    Sig: Take 1 tablet by mouth daily. With additional 1/2 tab in afternoon if needed.    Dispense:  45 tablet    Refill:  0    Ok to fill 06/07/21   varenicline (CHANTIX CONTINUING MONTH PAK) 1 MG tablet    Sig: Take 1 tablet (1 mg total) by mouth 2 (two) times daily.    Dispense:  60 tablet    Refill:  2   varenicline (CHANTIX) 0.5 MG tablet    Sig: Take one 0.5 mg tablet by mouth once daily for 3 days, then increase to one 0.5 mg tablet twice daily for 4 days, then increase to one 1 mg tablet twice daily. (Starting pack)    Dispense:  53 tablet    Refill:  0   amphetamine-dextroamphetamine (ADDERALL) 15 MG tablet    Sig: Take 1 tablet by mouth daily. With additional 1/2 tab in afternoon if needed.    Dispense:  45 tablet    Refill:  0   Patient Instructions  I will increase dose of Adderall temporarily. Chantix for smoking cessation. See info on quitting below.  I would recommend starting cholesterol medication if cholesterol levels are still elevated on today's labs.  We will let you know the results.  Thanks for coming in today and let me know if there are questions    Managing the Challenge of Quitting Smoking Quitting smoking is a physical and mental challenge. You will face cravings, withdrawal symptoms, and temptation. Before quitting, work with your health care provider to make a plan that can help you manage quitting. Preparation can help you quit and keep you from giving in. How to manage lifestyle changes Managing stress Stress can make you want to smoke, and wanting to smoke may cause  stress. It is important to find ways to manage your stress. You might try some of the following: Practice relaxation techniques. Breathe slowly and deeply, in through your nose and out through your mouth. Listen to music. Soak in a bath or take a shower. Imagine a peaceful place or vacation. Get some support. Talk with family or friends about your stress. Join a support group. Talk with a counselor or therapist. Get some physical activity. Go for a walk, run, or bike ride. Play a favorite sport. Practice yoga.  Medicines Talk with your health care provider about medicines that might help you deal with cravings and make quitting easier for you. Relationships Social situations can be difficult when you are quitting smoking. To manage this, you can: Avoid parties and other social situations where people might be smoking. Avoid alcohol. Leave right away if you have the urge to smoke. Explain to your family and friends that you are quitting smoking. Ask for support and let them know you might be a bit grumpy. Plan activities where smoking is  not an option. General instructions Be aware that many people gain weight after they quit smoking. However, not everyone does. To keep from gaining weight, have a plan in place before you quit and stick to the plan after you quit. Your plan should include: Having healthy snacks. When you have a craving, it may help to: Eat popcorn, carrots, celery, or other cut vegetables. Chew sugar-free gum. Changing how you eat. Eat small portion sizes at meals. Eat 4-6 small meals throughout the day instead of 1-2 large meals a day. Be mindful when you eat. Do not watch television or do other things that might distract you as you eat. Exercising regularly. Make time to exercise each day. If you do not have time for a long workout, do short bouts of exercise for 5-10 minutes several times a day. Do some form of strengthening exercise, such as weight lifting. Do  some exercise that gets your heart beating and causes you to breathe deeply, such as walking fast, running, swimming, or biking. This is very important. Drinking plenty of water or other low-calorie or no-calorie drinks. Drink 6-8 glasses of water daily.  How to recognize withdrawal symptoms Your body and mind may experience discomfort as you try to get used to not having nicotine in your system. These effects are called withdrawal symptoms. They may include: Feeling hungrier than normal. Having trouble concentrating. Feeling irritable or restless. Having trouble sleeping. Feeling depressed. Craving a cigarette. To manage withdrawal symptoms: Avoid places, people, and activities that trigger your cravings. Remember why you want to quit. Get plenty of sleep. Avoid coffee and other caffeinated drinks. These may worsen some of your symptoms. These symptoms may surprise you. But be assured that they are normal to have when quitting smoking. How to manage cravings Come up with a plan for how to deal with your cravings. The plan should include the following: A definition of the specific situation you want to deal with. An alternative action you will take. A clear idea for how this action will help. The name of someone who might help you with this. Cravings usually last for 5-10 minutes. Consider taking the following actions to help you with your plan to deal with cravings: Keep your mouth busy. Chew sugar-free gum. Suck on hard candies or a straw. Brush your teeth. Keep your hands and body busy. Change to a different activity right away. Squeeze or play with a ball. Do an activity or a hobby, such as making bead jewelry, practicing needlepoint, or working with wood. Mix up your normal routine. Take a short exercise break. Go for a quick walk or run up and down stairs. Focus on doing something kind or helpful for someone else. Call a friend or family member to talk during a craving. Join  a support group. Contact a quitline. Where to find support To get help or find a support group: Call the [facility]'s Smoking Quitline: 1-800-QUIT NOW 2065543796) Visit the website of the Substance Abuse and [place]: ktimeonline.com Text QUIT to SmokefreeTXT: 086578 Where to find more information Visit these websites to find more information on quitting smoking: [place]: www.smokefree.gov American Lung Association: www.lung.org American Cancer Society: www.cancer.org Centers for Disease Control and Prevention: http://www.wolf.info/ American Heart Association: www.heart.org Contact a health care provider if: You want to change your plan for quitting. The medicines you are taking are not helping. Your eating feels out of control or you cannot sleep. Get help right away if: You  feel depressed or become very anxious. Summary Quitting smoking is a physical and mental challenge. You will face cravings, withdrawal symptoms, and temptation to smoke again. Preparation can help you as you go through these challenges. Try different techniques to manage stress, handle social situations, and prevent weight gain. You can deal with cravings by keeping your mouth busy (such as by chewing gum), keeping your hands and body busy, calling family or friends, or contacting a quitline for people who want to quit smoking. You can deal with withdrawal symptoms by avoiding places where people smoke, getting plenty of rest, and avoiding drinks with caffeine. This information is not intended to replace advice given to you by your health care provider. Make sure you discuss any questions you have with your health care provider. Document Revised: 03/23/2021 Document Reviewed: 05/04/2019 Elsevier Patient Education  2022 [place],   Merri Ray, MD [place], [place] [facility] 09/03/21 9:29 PM

## 2021-09-03 NOTE — Patient Instructions (Addendum)
I will increase dose of Adderall temporarily. Chantix for smoking cessation. See info on quitting below.  I would recommend starting cholesterol medication if cholesterol levels are still elevated on today's labs.  We will let you know the results.  Thanks for coming in today and let me know if there are questions    Managing the Challenge of Quitting Smoking Quitting smoking is a physical and mental challenge. You will face cravings, withdrawal symptoms, and temptation. Before quitting, work with your health care provider to make a plan that can help you manage quitting. Preparation can help you quit and keep you from giving in. How to manage lifestyle changes Managing stress Stress can make you want to smoke, and wanting to smoke may cause stress. It is important to find ways to manage your stress. You might try some of the following: Practice relaxation techniques. Breathe slowly and deeply, in through your nose and out through your mouth. Listen to music. Soak in a bath or take a shower. Imagine a peaceful place or vacation. Get some support. Talk with family or friends about your stress. Join a support group. Talk with a counselor or therapist. Get some physical activity. Go for a walk, run, or bike ride. Play a favorite sport. Practice yoga.  Medicines Talk with your health care provider about medicines that might help you deal with cravings and make quitting easier for you. Relationships Social situations can be difficult when you are quitting smoking. To manage this, you can: Avoid parties and other social situations where people might be smoking. Avoid alcohol. Leave right away if you have the urge to smoke. Explain to your family and friends that you are quitting smoking. Ask for support and let them know you might be a bit grumpy. Plan activities where smoking is not an option. General instructions Be aware that many people gain weight after they quit smoking. However,  not everyone does. To keep from gaining weight, have a plan in place before you quit and stick to the plan after you quit. Your plan should include: Having healthy snacks. When you have a craving, it may help to: Eat popcorn, carrots, celery, or other cut vegetables. Chew sugar-free gum. Changing how you eat. Eat small portion sizes at meals. Eat 4-6 small meals throughout the day instead of 1-2 large meals a day. Be mindful when you eat. Do not watch television or do other things that might distract you as you eat. Exercising regularly. Make time to exercise each day. If you do not have time for a long workout, do short bouts of exercise for 5-10 minutes several times a day. Do some form of strengthening exercise, such as weight lifting. Do some exercise that gets your heart beating and causes you to breathe deeply, such as walking fast, running, swimming, or biking. This is very important. Drinking plenty of water or other low-calorie or no-calorie drinks. Drink 6-8 glasses of water daily.  How to recognize withdrawal symptoms Your body and mind may experience discomfort as you try to get used to not having nicotine in your system. These effects are called withdrawal symptoms. They may include: Feeling hungrier than normal. Having trouble concentrating. Feeling irritable or restless. Having trouble sleeping. Feeling depressed. Craving a cigarette. To manage withdrawal symptoms: Avoid places, people, and activities that trigger your cravings. Remember why you want to quit. Get plenty of sleep. Avoid coffee and other caffeinated drinks. These may worsen some of your symptoms. These symptoms may surprise you. But  be assured that they are normal to have when quitting smoking. How to manage cravings Come up with a plan for how to deal with your cravings. The plan should include the following: A definition of the specific situation you want to deal with. An alternative action you will  take. A clear idea for how this action will help. The name of someone who might help you with this. Cravings usually last for 5-10 minutes. Consider taking the following actions to help you with your plan to deal with cravings: Keep your mouth busy. Chew sugar-free gum. Suck on hard candies or a straw. Brush your teeth. Keep your hands and body busy. Change to a different activity right away. Squeeze or play with a ball. Do an activity or a hobby, such as making bead jewelry, practicing needlepoint, or working with wood. Mix up your normal routine. Take a short exercise break. Go for a quick walk or run up and down stairs. Focus on doing something kind or helpful for someone else. Call a friend or family member to talk during a craving. Join a support group. Contact a quitline. Where to find support To get help or find a support group: Call the [facility]'s Smoking Quitline: 1-800-QUIT NOW 215-852-6088) Visit the website of the Substance Abuse and [place]: ktimeonline.com Text QUIT to SmokefreeTXT: 016010 Where to find more information Visit these websites to find more information on quitting smoking: [place]: www.smokefree.gov American Lung Association: www.lung.org American Cancer Society: www.cancer.org Centers for Disease Control and Prevention: http://www.wolf.info/ American Heart Association: www.heart.org Contact a health care provider if: You want to change your plan for quitting. The medicines you are taking are not helping. Your eating feels out of control or you cannot sleep. Get help right away if: You feel depressed or become very anxious. Summary Quitting smoking is a physical and mental challenge. You will face cravings, withdrawal symptoms, and temptation to smoke again. Preparation can help you as you go through these challenges. Try different techniques to manage stress, handle social situations, and prevent  weight gain. You can deal with cravings by keeping your mouth busy (such as by chewing gum), keeping your hands and body busy, calling family or friends, or contacting a quitline for people who want to quit smoking. You can deal with withdrawal symptoms by avoiding places where people smoke, getting plenty of rest, and avoiding drinks with caffeine. This information is not intended to replace advice given to you by your health care provider. Make sure you discuss any questions you have with your health care provider. Document Revised: 03/23/2021 Document Reviewed: 05/04/2019 Elsevier Patient Education  [place].

## 2021-09-04 ENCOUNTER — Other Ambulatory Visit: Payer: Self-pay | Admitting: Podiatry

## 2021-09-04 NOTE — Telephone Encounter (Signed)
Please advise 

## 2021-09-21 NOTE — Progress Notes (Signed)
Cardiology Office Note:    Date:  10/05/2021   ID:  Louis Zimmerman, DOB 1961-03-14, MRN [id number]  PCP:  [name], MD  Cardiologist:  Evalina Field, MD  Electrophysiologist:  None   Referring MD: [name], MD   Chief Complaint: follow-up of shortness of breath  History of Present Illness:    Louis Zimmerman is a 61 y.o. male with a history of coronary calcifications noted on CT in 10/2020, mild COPD, ADHD, and tobacco abuse who is followed by Dr. Audie Box and presents today for routine follow-up of shortness of breath.   Patient was referred to Dr. Audie Box in 09/2020 for further evaluation of shortness of breath with activity. Recent chest x-ray showed emphysema. Echo and PFTs were ordered. Echo showed LVEF of 60-65% with normal wall motion, mild LVH, and grade 1 diastolic dysfunction as well as mild dilation of the ascending aorta measuring 37 mm. PFTs were consistent with COPD and he was referred to Pulmonology. Chest CT in 10/2020 showed moderately severe emphysematous lung disease as well as coronary atherosclerosis with calcified plaque in the distribution of the LAD. Patient was last seen by Dr. Audie Box in 04/2021 at which time he reported continued shortness of breath with heavy activity but overall symptoms improved. He denied any chest pain. He was counseled on the importance of smoking cessation and he was started on Aspirin and Crestor given coronary calcifications.   Patient presents today for follow-up.  Here alone.  Patient states he has done well from a cardiac standpoint since last visit.  He has chronic dyspnea on exertion with his COPD but he states this is stable.  He denies any chest pain.  No orthopnea, PND, or lower extremity edema.  No palpitations.  He occasionally has some lightheadedness/dizziness with standing quickly but he states this is a chronic issue and is stable.  No syncope.  He continues to smoke but has greatly reduced how much he smokes a  day.  He has a long smoking history and states he has smoked since he was little.  He is down from 2 packs a day to 1/2 pack a day.  He has been using Chantix and this has helped.   Lipid panel done at PCPs office last month showed LDL of 195.  He had never started the Crestor as recommended at last visit with Dr. Audie Box and had been trying a natural supplement.  However, he did start the Crestor about 3 weeks ago.  Past Medical History:  Diagnosis Date   ADD (attention deficit disorder)    Anxiety    Arthritis    feet, shoulders   Cancer ([place])    skin cancer   Carpal tunnel syndrome    Chronic dyspnea    COPD (chronic obstructive pulmonary disease) ([name])    Coronary artery calcification    noted on CT in 10/2020   Depression    Hyperlipidemia    Pre-diabetes    Tobacco abuse     Past Surgical History:  Procedure Laterality Date   CARPAL TUNNEL RELEASE Right 11/13/2020   Procedure: RIGHT CARPAL TUNNEL RELEASE;  Surgeon: Meredith Pel, MD;  Location: [facility];  Service: Orthopedics;  Laterality: Right;   CHOLECYSTECTOMY     COLONOSCOPY     2000   GALLBLADDER SURGERY  2003   SHOULDER ARTHROSCOPY Left 02/16/2019   LEFT SHOULDER ARTHROSCOPY LOWER TRAPEZIUS TENDON TRANSFER, BICEPS TENODESIS   SHOULDER ARTHROSCOPY WITH DEBRIDEMENT AND BICEP  TENDON REPAIR Left 02/16/2019   Procedure: LEFT SHOULDER ARTHROSCOPY LOWER TRAPEZIUS TENDON TRANSFER, BICEPS TENODESIS;  Surgeon: Meredith Pel, MD;  Location: [facility];  Service: Orthopedics;  Laterality: Left;   SKIN BIOPSY  2019    Current Medications: Current Meds  Medication Sig   acyclovir (ZOVIRAX) 400 MG tablet Take 1 tablet (400 mg total) by mouth 2 (two) times daily. Increase to 2 tabs BID for 5 days if flare.   amphetamine-dextroamphetamine (ADDERALL) 15 MG tablet Take 1 tablet by mouth daily. With additional 1/2 tab in afternoon if needed.   aspirin EC 81 MG tablet Take 1 tablet (81 mg total) by mouth daily.  Swallow whole.   citalopram (CELEXA) 40 MG tablet Take 1 tablet (40 mg total) by mouth daily.   diclofenac (VOLTAREN) 75 MG EC tablet TAKE ONE TABLET BY MOUTH TWICE DAILY   doxycycline (VIBRA-TABS) 100 MG tablet Take 1 tablet (100 mg total) by mouth 2 (two) times daily.   gabapentin (NEURONTIN) 300 MG capsule Take 1 capsule (300 mg total) by mouth every morning AND 2 capsules (600 mg total) at bedtime.   ivermectin (STROMECTOL) 3 MG TABS tablet Take 18 mg by mouth. Patient takes 6 tablets every two weeks.   varenicline (CHANTIX CONTINUING MONTH PAK) 1 MG tablet Take 1 tablet (1 mg total) by mouth 2 (two) times daily.   varenicline (CHANTIX) 0.5 MG tablet Take one 0.5 mg tablet by mouth once daily for 3 days, then increase to one 0.5 mg tablet twice daily for 4 days, then increase to one 1 mg tablet twice daily. (Starting pack)   [DISCONTINUED] amphetamine-dextroamphetamine (ADDERALL) 15 MG tablet Take 1 tablet by mouth daily. With additional half tab in afternoon if needed.   [DISCONTINUED] amphetamine-dextroamphetamine (ADDERALL) 15 MG tablet Take 1 tablet by mouth daily. With additional 1/2 tab in afternoon if needed.     Allergies:   Hydrocodone and Sertraline   Social History   Socioeconomic History   Marital status: Single    Spouse name: Not on file   Number of children: 3   Years of education: Not on file   Highest education level: Not on file  Occupational History   Not on file  Tobacco Use   Smoking status: Every Day    Packs/day: 1.00    Years: 50.00    Pack years: 50.00    Types: Cigarettes   Smokeless tobacco: Never  Vaping Use   Vaping Use: Former  Substance and Sexual Activity   Alcohol use: No   Drug use: No    Frequency: 7.0 times per week    Types: IV    Comment: history of Rx opoid dependency   Sexual activity: Not Currently  Other Topics Concern   Not on file  Social History Narrative   Not on file   Social Determinants of Health   Financial Resource  Strain: Not on file  Food Insecurity: Not on file  Transportation Needs: Not on file  Physical Activity: Not on file  Stress: Not on file  Social Connections: Not on file     Family History: The patient's family history includes Alcohol abuse in his maternal grandfather and paternal grandfather; Cancer in his mother; Colon cancer in his father; Colon polyps in his sister and sister; Dementia in his mother; Diabetes in his mother; Heart Problems in his mother; Heart disease in his mother; Liver cancer in his father. There is no history of Esophageal cancer, Stomach cancer, or Rectal cancer.  ROS:   Please see the history of present illness.     EKGs/Labs/Other Studies Reviewed:    The following studies were reviewed today:  Echocardiogram 11/10/2020: Impressions:  1. Left ventricular ejection fraction, by estimation, is 60 to 65%. The  left ventricle has normal function. The left ventricle has no regional  wall motion abnormalities. There is mild concentric left ventricular  hypertrophy. Left ventricular diastolic  parameters are consistent with Grade I diastolic dysfunction (impaired  relaxation).   2. Right ventricular systolic function is normal. The right ventricular  size is normal.   3. The mitral valve is normal in structure. Trivial mitral valve  regurgitation.   4. The aortic valve is tricuspid. Aortic valve regurgitation is not  visualized. No aortic stenosis is present.   5. Aortic dilatation noted. There is mild dilatation of the ascending  aorta, measuring 37 mm.   6. The inferior vena cava is normal in size with greater than 50%  respiratory variability, suggesting right atrial pressure of 3 mmHg.   Comparison(s): Compared to prior TTE in 2019, there is now mild ascending  aortic dilation (12mm). Otherwise no significant change.  EKG:  EKG ordered today. EKG personally reviewed and demonstrates normal sinus rhythm, rate 65 bpm, with incomplete right bundle branch  block but no acute ST/T changes.  Left axis deviation. Normal PR and QRS intervals.  QTc 426 ms. No significant changes from prior tracing.  Recent Labs: 10/13/2020: TSH 3.460 11/09/2020: Hemoglobin 13.2; Platelets 304 09/03/2021: ALT 24; BUN 14; Creatinine, Ser 0.92; Potassium 3.6; Sodium 139  Recent Lipid Panel    Component Value Date/Time   CHOL 257 (H) 09/03/2021 1119   TRIG 257.0 (H) 09/03/2021 1119   HDL 32.40 (L) 09/03/2021 1119   CHOLHDL 8 09/03/2021 1119   VLDL 51.4 (H) 09/03/2021 1119   LDLCALC 137 (H) 11/24/2020 0950   LDLDIRECT 195.0 09/03/2021 1119    Physical Exam:    Vital Signs: BP 118/82    Pulse 65    Ht 6' (1.829 m)    Wt 189 lb 9.6 oz (86 kg)    SpO2 100%    BMI 25.71 kg/m     Wt Readings from Last 3 Encounters:  10/05/21 189 lb 9.6 oz (86 kg)  09/03/21 184 lb 12.8 oz (83.8 kg)  06/01/21 184 lb 6.4 oz (83.6 kg)     General: 61 y.o. Caucasian male in no acute distress. HEENT: Normocephalic and atraumatic. Sclera clear.  Neck: Supple. No carotid bruits. No JVD. Heart: RRR. Distinct S1 and S2. No murmurs, gallops, or rubs. Radial  pulses 2+ and equal bilaterally. Lungs: No increased work of breathing. Clear to ausculation bilaterally. No wheezes, rhonchi, or rales.  Abdomen: Soft, non-distended, and non-tender to palpation.  MSK: Normal strength and tone for age.  Extremities: No lower extremity edema.    Skin: Warm and dry. Neuro: Alert and oriented x3. No focal deficits. Psych: Normal affect. Responds appropriately.  Assessment:    1. Coronary artery calcification   2. Chronic dyspnea   3. Chronic obstructive pulmonary disease, unspecified COPD type ([place])   4. Hyperlipidemia, unspecified hyperlipidemia type   5. Tobacco abuse     Plan:    Coronary Calcifications  Coronary calcifications noted in the LAD distribution on chest CT in 10/2020.  -No angina.  -Continue aspirin and high-intensity statin.  -Discussed importance of complete smoking  cessation.  Chronic Dyspnea  COPD  Echo in 10/2020 showed LVEF of 60-65% with mild LVH  and grade 1 diastolic dysfunction. PFTs in 10/2020 consistent with COPD. Chest CT showed moderately severe emphysematous lung disease.  -Stable.  -Patient was referred to Pulmonology but missed appointment. Advised him to follow-up with PCP or Pulmonology.  Hyperlipidemia  Recent lipid panel in 08/2021: Total Cholesterol 257, Triglycerides 257, HDL 32.4. Direct LDL 195. LDL goal <70 given coronary calcifications. Crestor 40mg  daily was recommended at last visit in 04/2021 but patient had not started this yet. -Continue Crestor 40mg  daily. He states he started this about 3 weeks ago. -Will need repeat lipid panel and LFTs in about 2 months. He states PCP will likely check them. Asked him to let us know if PCP does not and we can place order for labs.  Tobacco Abuse  He has a long smoking history and continues to smoke but has made progress in cutting back. He is down from 2 packs a day to 1/2 pack a day.  He is on Chantix and this has helped.   -Congratulated patient on progress made so far and encouraged him to continue to work towards complete cessation.  Disposition: Follow up in 6 months.   Medication Adjustments/Labs and Tests Ordered: Current medicines are reviewed at length with the patient today.  Concerns regarding medicines are outlined above.  Orders Placed This Encounter  Procedures   EKG 12-Lead   Meds ordered this encounter  Medications   rosuvastatin (CRESTOR) 40 MG tablet    Sig: Take 1 tablet (40 mg total) by mouth daily.    Dispense:  90 tablet    Refill:  3    Patient Instructions  Medication Instructions:  Your physician recommends that you continue on your current medications as directed. Please refer to the Current Medication list given to you today.  *If you need a refill on your cardiac medications before your next appointment, please call your pharmacy*   Lab  Work: None ordered today, but have your PCP recheck your Lipids / LFT's in 2 months  If you have labs (blood work) drawn today and your tests are completely normal, you will receive your results only by: [facility] (if you have MyChart) OR A paper copy in the mail If you have any lab test that is abnormal or we need to change your treatment, we will call you to review the results.   Testing/Procedures: None ordered   Follow-Up: At [facility], you and your health needs are our priority.  As part of our continuing mission to provide you with exceptional heart care, we have created designated Provider Care Teams.  These Care Teams include your primary Cardiologist (physician) and Advanced Practice Providers (APPs -  Physician Assistants and Nurse Practitioners) who all work together to provide you with the care you need, when you need it.  We recommend signing up for the patient portal called "MyChart".  Sign up information is provided on this After Visit Summary.  MyChart is used to connect with patients for Virtual Visits (Telemedicine).  Patients are able to view lab/test results, encounter notes, upcoming appointments, etc.  Non-urgent messages can be sent to your provider as well.   To learn more about what you can do with MyChart, go to [email or web address].    Your next appointment:   6 month(s)  04/08/22 ARRIVE AT 7:45   The format for your next appointment:   In Person  Provider:   Evalina Field, MD     Other Instructions    Signed, Darreld Mclean, PA-C  10/05/2021 8:42 AM    [facility]

## 2021-10-05 ENCOUNTER — Ambulatory Visit: Payer: BC Managed Care – PPO | Admitting: Student

## 2021-10-05 ENCOUNTER — Encounter: Payer: Self-pay | Admitting: Student

## 2021-10-05 ENCOUNTER — Other Ambulatory Visit: Payer: Self-pay

## 2021-10-05 VITALS — BP 118/82 | HR 65 | Ht 72.0 in | Wt 189.6 lb

## 2021-10-05 DIAGNOSIS — I2510 Atherosclerotic heart disease of native coronary artery without angina pectoris: Secondary | ICD-10-CM

## 2021-10-05 DIAGNOSIS — E785 Hyperlipidemia, unspecified: Secondary | ICD-10-CM

## 2021-10-05 DIAGNOSIS — I2584 Coronary atherosclerosis due to calcified coronary lesion: Secondary | ICD-10-CM

## 2021-10-05 DIAGNOSIS — R0609 Other forms of dyspnea: Secondary | ICD-10-CM

## 2021-10-05 DIAGNOSIS — Z720 Tobacco use: Secondary | ICD-10-CM

## 2021-10-05 DIAGNOSIS — J449 Chronic obstructive pulmonary disease, unspecified: Secondary | ICD-10-CM

## 2021-10-05 MED ORDER — ROSUVASTATIN CALCIUM 40 MG PO TABS: 40.0000 mg | ORAL_TABLET | Freq: Every day | ORAL | 3 refills | Status: DC

## 2021-10-05 NOTE — Patient Instructions (Signed)
Medication Instructions:  ?Your physician recommends that you continue on your current medications as directed. Please refer to the Current Medication list given to you today. ? ?*If you need a refill on your cardiac medications before your next appointment, please call your pharmacy* ? ? ?Lab Work: ?None ordered today, but have your PCP recheck your Lipids / LFT's in 2 months ? ?If you have labs (blood work) drawn today and your tests are completely normal, you will receive your results only by: ?MyChart Message (if you have MyChart) OR ?A paper copy in the mail ?If you have any lab test that is abnormal or we need to change your treatment, we will call you to review the results. ? ? ?Testing/Procedures: ?None ordered ? ? ?Follow-Up: ?At [facility], you and your health needs are our priority.  As part of our continuing mission to provide you with exceptional heart care, we have created designated Provider Care Teams.  These Care Teams include your primary Cardiologist (physician) and Advanced Practice Providers (APPs -  Physician Assistants and Nurse Practitioners) who all work together to provide you with the care you need, when you need it. ? ?We recommend signing up for the patient portal called "MyChart".  Sign up information is provided on this After Visit Summary.  MyChart is used to connect with patients for Virtual Visits (Telemedicine).  Patients are able to view lab/test results, encounter notes, upcoming appointments, etc.  Non-urgent messages can be sent to your provider as well.   ?To learn more about what you can do with MyChart, go to [email or web address].   ? ?Your next appointment:   ?6 month(s)  04/08/22 ARRIVE AT 7:45  ? ?The format for your next appointment:   ?In Person ? ?Provider:   ?Evalina Field, MD   ? ? ?Other Instructions ? ?

## 2021-10-15 DIAGNOSIS — R351 Nocturia: Secondary | ICD-10-CM | POA: Diagnosis not present

## 2021-10-15 DIAGNOSIS — N5201 Erectile dysfunction due to arterial insufficiency: Secondary | ICD-10-CM | POA: Diagnosis not present

## 2021-10-15 DIAGNOSIS — R3915 Urgency of urination: Secondary | ICD-10-CM | POA: Diagnosis not present

## 2021-11-16 ENCOUNTER — Ambulatory Visit: Payer: BC Managed Care – PPO | Admitting: Surgical

## 2021-11-16 ENCOUNTER — Other Ambulatory Visit: Payer: Self-pay | Admitting: Family Medicine

## 2021-11-16 ENCOUNTER — Encounter: Payer: Self-pay | Admitting: Surgical

## 2021-11-16 ENCOUNTER — Ambulatory Visit: Payer: Self-pay

## 2021-11-16 DIAGNOSIS — M7541 Impingement syndrome of right shoulder: Secondary | ICD-10-CM | POA: Diagnosis not present

## 2021-11-16 DIAGNOSIS — F17210 Nicotine dependence, cigarettes, uncomplicated: Secondary | ICD-10-CM

## 2021-11-16 DIAGNOSIS — M25511 Pain in right shoulder: Secondary | ICD-10-CM | POA: Diagnosis not present

## 2021-11-18 ENCOUNTER — Encounter: Payer: Self-pay | Admitting: Surgical

## 2021-11-18 MED ORDER — BUPIVACAINE HCL 0.5 % IJ SOLN
1.0000 mL | INTRAMUSCULAR | Status: AC | PRN
  Administered 2021-11-16: 1 mL

## 2021-11-18 NOTE — Progress Notes (Addendum)
? ?Office Visit Note ?  ?Patient: Louis Zimmerman           ?Date of Birth: [birth year]           ?MRN: [id number] ?Visit Date: 11/16/2021 ?Requested by: [name], MD ?[address] ?[place],  [place] [id number] ?PCP: [name], MD ? ?Subjective: ?Chief Complaint  ?Patient presents with  ? Right Shoulder - Pain, Follow-up  ? ? ?HPI: Louis Zimmerman is a 61 y.o. male who presents to the office complaining of right shoulder pain.  Patient complains of right shoulder pain similar to his last appointment with about 50% of his pain localized to the scapular region and a trigger point and 50% of his pain in the anterior lateral right shoulder.  He has no history of surgery to the right shoulder.  Does have history of left shoulder lower trapezius tendon transfer that has done very well for him.  Denies any radicular pain down the right arm.  No recent injuries.  He does have pain that wakes him at night.  He has had a previous subacromial injection last year that provided good relief but wore off after several months.  Mostly has the lateral shoulder pain with trying to sleep on his right shoulder..   ?             ?ROS: All systems reviewed are negative as they relate to the chief complaint within the history of present illness.  Patient denies fevers or chills. ? ?Assessment & Plan: ?Visit Diagnoses:  ?1. Impingement syndrome of right shoulder   ? ? ?Plan: Patient is a 61 year old male who presents for evaluation of right shoulder pain.  He has history of right shoulder pain that has been ongoing for several years.  He has history of left shoulder massive rotator cuff tear that necessitated lower trapezius tendon transfer.  He has had prior subacromial injection in the right shoulder as well as trigger point injections in the scapular region of the right shoulder with good relief from both for the 2 different locations of pain he is having.  He states that he would like to try another trigger point  injection today.  Trigger point injection successfully administered using a Toradol injection and ultrasound guidance in order to avoid going to deep.  Plan to have patient work with physical therapy upstairs for 1 visit to design a home exercise program for scapulothoracic exercises.  Order MRI arthrogram of the right shoulder for evaluation of scapular pain and for possible rotator cuff pathology.  Follow-up after MRI to review results.  He would also like to have left carpal tunnel release at some point in the future as he had his right carpal tunnel released last year and is doing well from this.  Incision has healed well. ? ?Follow-Up Instructions: No follow-ups on file.  ? ?Orders:  ?Orders Placed This Encounter  ?Procedures  ? US Guided Needle Placement - No Linked Charges  ? MR SHOULDER RIGHT W CONTRAST  ? Arthrogram  ? Ambulatory referral to Physical Therapy  ? ?No orders of the defined types were placed in this encounter. ? ? ? ? Procedures: ?Trigger Point Inj ? ?Date/Time: 11/16/2021 9:47 AM ?Performed by: Donella Stade, PA-C ?Authorized by: Donella Stade, PA-C  ? ?Consent Given by:  Patient ?Site marked: the procedure site was marked   ?Timeout: prior to procedure the correct patient, procedure, and site was verified   ?Indications:  Pain ?Total # of Trigger Points:  1 ?Location: back   ?Needle Size:  25 G ?Medications #1:  1 mL bupivacaine 0.5 % (1 mL of bupivacaine mixed with 1 cc of Toradol) ?Patient tolerance:  Patient tolerated the procedure well with no immediate complications ? ? ?Clinical Data: ?No additional findings. ? ?Objective: ?Vital Signs: There were no vitals taken for this visit. ? ?Physical Exam:  ?Constitutional: Patient appears well-developed ?HEENT:  ?Head: Normocephalic ?Eyes:EOM are normal ?Neck: Normal range of motion ?Cardiovascular: Normal rate ?Pulmonary/chest: Effort normal ?Neurologic: Patient is alert ?Skin: Skin is warm ?Psychiatric: Patient has normal mood and  affect ? ?Ortho Exam: Right shoulder with well-preserved active and passive range of motion.  Tenderness over the bicipital groove.  No tenderness over the [facility] joint.  He has tenderness over the trigger point just medial to the scapula in the posterior right shoulder.  There is no atrophy of the rotator cuff on the right-hand side.  5/5 motor strength of right supraspinatus infraspinatus subscapularis but testing supraspinatus and infraspinatus reproduces his pain.  Negative external rotation lag sign.  Negative Hornblower sign.  No pain with cervical spine range of motion.  5/5 motor strength of bilateral grip strength, finger abduction, pronation/supination, bicep, tricep, deltoid. ? ?Specialty Comments:  ?No specialty comments available. ? ?Imaging: ?No results found. ? ? ?PMFS History: ?Patient Active Problem List  ? Diagnosis Date Noted  ? Coronary artery calcification   ? Hyperlipidemia   ? Rotator cuff arthropathy 02/16/2019  ? FHx: colon cancer 12/18/2018  ? Attention deficit disorder (ADD) in adult 11/10/2017  ? Moderate episode of recurrent major depressive disorder ([place]) 08/31/2017  ? 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/31/2017  ? Tobacco abuse 08/31/2017  ? History of substance abuse ([place]) 02/23/2017  ? Depression 02/02/2013  ? ?Past Medical History:  ?Diagnosis Date  ? ADD (attention deficit disorder)   ? Anxiety   ? Arthritis   ? feet, shoulders  ? Cancer [facility])   ? skin cancer  ? Carpal tunnel syndrome   ? Chronic dyspnea   ? COPD (chronic obstructive pulmonary disease) ([place])   ? Coronary artery calcification   ? noted on CT in 10/2020  ? Depression   ? Hyperlipidemia   ? Pre-diabetes   ? Tobacco abuse   ?  ?Family History  ?Problem Relation Age of Onset  ? Cancer Mother   ? Dementia Mother   ? Diabetes Mother   ? Heart Problems Mother   ? Heart disease Mother   ? Liver cancer Father   ? Colon cancer Father   ? Alcohol abuse Maternal Grandfather   ? Alcohol abuse Paternal Grandfather   ? Colon polyps Sister    ? Colon polyps Sister   ? Esophageal cancer Neg Hx   ? Stomach cancer Neg Hx   ? Rectal cancer Neg Hx   ?  ?Past Surgical History:  ?Procedure Laterality Date  ? CARPAL TUNNEL RELEASE Right 11/13/2020  ? Procedure: RIGHT CARPAL TUNNEL RELEASE;  Surgeon: Meredith Pel, MD;  Location: [facility];  Service: Orthopedics;  Laterality: Right;  ? CHOLECYSTECTOMY    ? COLONOSCOPY    ? 2000  ? GALLBLADDER SURGERY  2003  ? SHOULDER ARTHROSCOPY Left 02/16/2019  ? LEFT SHOULDER ARTHROSCOPY LOWER TRAPEZIUS TENDON TRANSFER, BICEPS TENODESIS  ? SHOULDER ARTHROSCOPY WITH DEBRIDEMENT AND BICEP TENDON REPAIR Left 02/16/2019  ? Procedure: LEFT SHOULDER ARTHROSCOPY LOWER TRAPEZIUS TENDON TRANSFER, BICEPS TENODESIS;  Surgeon: Meredith Pel, MD;  Location: [facility];  Service: Orthopedics;  Laterality: Left;  ? SKIN BIOPSY  2019  ? ?Social History  ? ?Occupational History  ? Not on file  ?Tobacco Use  ? Smoking status: Every Day  ?  Packs/day: 1.00  ?  Years: 50.00  ?  Pack years: 50.00  ?  Types: Cigarettes  ? Smokeless tobacco: Never  ?Vaping Use  ? Vaping Use: Former  ?Substance and Sexual Activity  ? Alcohol use: No  ? Drug use: No  ?  Frequency: 7.0 times per week  ?  Types: IV  ?  Comment: history of Rx opoid dependency  ? Sexual activity: Not Currently  ? ? ? ? ?  ?

## 2021-11-27 ENCOUNTER — Ambulatory Visit
Admission: RE | Admit: 2021-11-27 | Discharge: 2021-11-27 | Disposition: A | Discharge status: Home / Self Care | Payer: BC Managed Care – PPO | Source: Ambulatory Visit | Attending: Surgical | Admitting: Surgical

## 2021-11-27 DIAGNOSIS — M7541 Impingement syndrome of right shoulder: Secondary | ICD-10-CM | POA: Diagnosis not present

## 2021-11-27 DIAGNOSIS — S46011A Strain of muscle(s) and tendon(s) of the rotator cuff of right shoulder, initial encounter: Secondary | ICD-10-CM | POA: Diagnosis not present

## 2021-11-27 MED ORDER — IOPAMIDOL (ISOVUE-M 200) INJECTION 41%
18.0000 mL | Freq: Once | INTRAMUSCULAR | Status: AC
  Administered 2021-11-27: 18 mL via INTRA_ARTICULAR

## 2021-12-03 ENCOUNTER — Encounter: Payer: Self-pay | Admitting: Family Medicine

## 2021-12-03 ENCOUNTER — Ambulatory Visit: Payer: BC Managed Care – PPO | Admitting: Family Medicine

## 2021-12-03 VITALS — BP 128/76 | HR 71 | Temp 98.2°F | Resp 16 | Ht 72.0 in | Wt 187.4 lb

## 2021-12-03 DIAGNOSIS — F17210 Nicotine dependence, cigarettes, uncomplicated: Secondary | ICD-10-CM | POA: Diagnosis not present

## 2021-12-03 DIAGNOSIS — F988 Other specified behavioral and emotional disorders with onset usually occurring in childhood and adolescence: Secondary | ICD-10-CM

## 2021-12-03 DIAGNOSIS — F331 Major depressive disorder, recurrent, moderate: Secondary | ICD-10-CM

## 2021-12-03 DIAGNOSIS — R202 Paresthesia of skin: Secondary | ICD-10-CM

## 2021-12-03 DIAGNOSIS — E785 Hyperlipidemia, unspecified: Secondary | ICD-10-CM | POA: Diagnosis not present

## 2021-12-03 LAB — LIPID PANEL
Cholesterol: 160 mg/dL (ref 0–200)
HDL: 35.8 mg/dL — ABNORMAL LOW (ref >39.00)
NonHDL: 124.32
Total CHOL/HDL Ratio: 4
Triglycerides: 219 mg/dL — ABNORMAL HIGH (ref 0.0–149.0)
VLDL: 43.8 mg/dL — ABNORMAL HIGH (ref 0.0–40.0)

## 2021-12-03 LAB — LDL CHOLESTEROL, DIRECT: Direct LDL: 96 mg/dL

## 2021-12-03 LAB — COMPREHENSIVE METABOLIC PANEL
ALT: 25 U/L (ref 0–53)
AST: 18 U/L (ref 0–37)
Albumin: 4.3 g/dL (ref 3.5–5.2)
Alkaline Phosphatase: 95 U/L (ref 39–117)
BUN: 12 mg/dL (ref 6–23)
CO2: 27 mEq/L (ref 19–32)
Calcium: 9.2 mg/dL (ref 8.4–10.5)
Chloride: 104 mEq/L (ref 96–112)
Creatinine, Ser: 0.93 mg/dL (ref 0.40–1.50)
GFR: 89.53 mL/min (ref >60.00)
Glucose, Bld: 66 mg/dL — ABNORMAL LOW (ref 70–99)
Potassium: 4 mEq/L (ref 3.5–5.1)
Sodium: 138 mEq/L (ref 135–145)
Total Bilirubin: 0.5 mg/dL (ref 0.2–1.2)
Total Protein: 6.8 g/dL (ref 6.0–8.3)

## 2021-12-03 MED ORDER — GABAPENTIN 300 MG PO CAPS: ORAL_CAPSULE | ORAL | 1 refills | Status: DC

## 2021-12-03 MED ORDER — CITALOPRAM HYDROBROMIDE 40 MG PO TABS: 40.0000 mg | ORAL_TABLET | Freq: Every day | ORAL | 1 refills | Status: DC

## 2021-12-03 NOTE — Progress Notes (Signed)
? ?Subjective:  ?Patient ID: Louis Zimmerman, male    DOB: 08/08/1960  Age: 61 y.o. MRN: [id number] ? ?CC:  ?Chief Complaint  ?Patient presents with  ? Hyperlipidemia  ?  Pt here for recheck did start crestor after last visit notes no side effects   ? ADHD  ?  Pt was increased temporarily and notes this has worked well for him  ? Smoking Cessation Screening  ?  Pt has continued to try quitting notes he has decreased his intake   ? ? ?HPI ?Louis Zimmerman presents for  ? ?Attention deficit disorder ?Discussed in February.  Had required the full adderall 15 mg pill in the morning, half dosage in the afternoon, working better at that dosing. Tried 1/2 dose in am - not working.    ?No CP, palpitations, insomnia, or appetite. ?Controlled substance database (PDMP) reviewed. No concerns appreciated.  Last filled 11/16/2021 ? ? ?Hyperlipidemia: ?Crestor recommended by cardiology, started after his last labs in February.  Taking 40 mg daily.  History of coronary artery calcification. Reviewed cardiology note from March. No changes.  ?No food today. Monster energy drink this am. (This or coffee - cutting back - trying to drink more water).  ?No side effects with Crestor  ?Lab Results  ?Component Value Date  ? CHOL 257 (H) 09/03/2021  ? HDL 32.40 (L) 09/03/2021  ? LDLCALC 137 (H) 11/24/2020  ? LDLDIRECT 195.0 09/03/2021  ? TRIG 257.0 (H) 09/03/2021  ? CHOLHDL 8 09/03/2021  ? ?Lab Results  ?Component Value Date  ? ALT 24 09/03/2021  ? AST 19 09/03/2021  ? ALKPHOS 79 09/03/2021  ? BILITOT 0.5 09/03/2021  ? ?Nicotine addiction ?He has cut back on cigarette intake, discussed with cardiology in March.  Taking Chantix at that time, down from 2 packs a day to a half a pack a day.  Currently at 1/2ppd. Still taking chantix - has been helpful.  ? ?On celexa for depression/anxiety - working ok. ? ?Followed by ortho for shoulder issues. Recent MRI. Has joint issues in toes, chronic shoulder pain, treated with gabapentin '300mg'$  qam,  '600mg'$  qpm.  ? ? ?  12/03/2021  ?  9:10 AM 09/03/2021  ? 10:15 AM 06/01/2021  ? 10:30 AM 03/02/2021  ? 10:40 AM 03/02/2021  ? 10:39 AM  ?Depression screen PHQ 2/9  ?Decreased Interest 0 0 0 0 0  ?Down, Depressed, Hopeless 0 0 0 0 0  ?PHQ - 2 Score 0 0 0 0 0  ?Altered sleeping 0 0  0   ?Tired, decreased energy 0 0  0   ?Change in appetite 0 0  0   ?Feeling bad or failure about yourself  0 0  0   ?Trouble concentrating 1 1  0   ?Moving slowly or fidgety/restless 0 1  0   ?Suicidal thoughts 0 0  0   ?PHQ-9 Score 1 2  0   ? ? ? ?History ?Patient Active Problem List  ? Diagnosis Date Noted  ? Coronary artery calcification   ? Hyperlipidemia   ? Rotator cuff arthropathy 02/16/2019  ? FHx: colon cancer 12/18/2018  ? Attention deficit disorder (ADD) in adult 11/10/2017  ? Moderate episode of recurrent major depressive disorder ([place]) 08/31/2017  ? 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/31/2017  ? Tobacco abuse 08/31/2017  ? History of substance abuse ([place]) 02/23/2017  ? Depression 02/02/2013  ? ?Past Medical History:  ?Diagnosis Date  ? ADD (attention deficit disorder)   ? Anxiety   ?  Arthritis   ? feet, shoulders  ? Cancer [facility])   ? skin cancer  ? Carpal tunnel syndrome   ? Chronic dyspnea   ? COPD (chronic obstructive pulmonary disease) ([place])   ? Coronary artery calcification   ? noted on CT in 10/2020  ? Depression   ? Hyperlipidemia   ? Pre-diabetes   ? Tobacco abuse   ? ?Past Surgical History:  ?Procedure Laterality Date  ? CARPAL TUNNEL RELEASE Right 11/13/2020  ? Procedure: RIGHT CARPAL TUNNEL RELEASE;  Surgeon: Meredith Pel, MD;  Location: [place];  Service: Orthopedics;  Laterality: Right;  ? CHOLECYSTECTOMY    ? COLONOSCOPY    ? 2000  ? GALLBLADDER SURGERY  2003  ? SHOULDER ARTHROSCOPY Left 02/16/2019  ? LEFT SHOULDER ARTHROSCOPY LOWER TRAPEZIUS TENDON TRANSFER, BICEPS TENODESIS  ? SHOULDER ARTHROSCOPY WITH DEBRIDEMENT AND BICEP TENDON REPAIR Left 02/16/2019  ? Procedure: LEFT SHOULDER ARTHROSCOPY LOWER  TRAPEZIUS TENDON TRANSFER, BICEPS TENODESIS;  Surgeon: Meredith Pel, MD;  Location: [place];  Service: Orthopedics;  Laterality: Left;  ? SKIN BIOPSY  2019  ? ?Allergies  ?Allergen Reactions  ? Hydrocodone Anaphylaxis  ? Sertraline   ?  sedating  ? ?Prior to Admission medications   ?Medication Sig Start Date End Date Taking? Authorizing Provider  ?acyclovir (ZOVIRAX) 400 MG tablet Take 1 tablet (400 mg total) by mouth 2 (two) times daily. Increase to 2 tabs BID for 5 days if flare. 06/01/21   [name], MD  ?amphetamine-dextroamphetamine (ADDERALL) 15 MG tablet Take 1 tablet by mouth daily. With additional 1/2 tab in afternoon if needed. 09/03/21   [name], MD  ?aspirin EC 81 MG tablet Take 1 tablet (81 mg total) by mouth daily. Swallow whole. 05/28/21   O'NealCassie Freer, MD  ?citalopram (CELEXA) 40 MG tablet Take 1 tablet (40 mg total) by mouth daily. 09/03/21   [name], MD  ?diclofenac (VOLTAREN) 75 MG EC tablet TAKE ONE TABLET BY MOUTH TWICE DAILY 09/04/21   Edrick Kins, DPM  ?doxycycline (VIBRA-TABS) 100 MG tablet Take 1 tablet (100 mg total) by mouth 2 (two) times daily. 11/27/20   Meredith Pel, MD  ?gabapentin (NEURONTIN) 300 MG capsule Take 1 capsule (300 mg total) by mouth every morning AND 2 capsules (600 mg total) at bedtime. 09/03/21   [name], MD  ?ivermectin (STROMECTOL) 3 MG TABS tablet Take 18 mg by mouth. Patient takes 6 tablets every two weeks.    [provider]  ?rosuvastatin (CRESTOR) 40 MG tablet Take 1 tablet (40 mg total) by mouth daily. 10/05/21 01/03/22  Darreld Mclean, PA-C  ?varenicline (CHANTIX CONTINUING MONTH PAK) 1 MG tablet Take 1 tablet (1 mg total) by mouth 2 (two) times daily. 09/03/21   [name], MD  ?varenicline (CHANTIX PAK) 0.5 MG X 11 & 1 MG X 42 tablet take one 0.'5mg'$  tablet by mouth once daily for 3 days, then increase to one 0.'5mg'$  tablet twice a day for 4 days, then increase to one '1mg'$  tablet twice a day 11/16/21    [name], MD  ? ?Social History  ? ?Socioeconomic History  ? Marital status: Single  ?  Spouse name: Not on file  ? Number of children: 3  ? Years of education: Not on file  ? Highest education level: Not on file  ?Occupational History  ? Not on file  ?Tobacco Use  ? Smoking status: Every Day  ?  Packs/day: 1.00  ?  Years: 50.00  ?  Pack years: 50.00  ?  Types: Cigarettes  ? Smokeless tobacco: Never  ?Vaping Use  ? Vaping Use: Former  ?Substance and Sexual Activity  ? Alcohol use: No  ? Drug use: No  ?  Frequency: 7.0 times per week  ?  Types: IV  ?  Comment: history of Rx opoid dependency  ? Sexual activity: Not Currently  ?Other Topics Concern  ? Not on file  ?Social History Narrative  ? Not on file  ? ?Social Determinants of Health  ? ?Financial Resource Strain: Not on file  ?Food Insecurity: Not on file  ?Transportation Needs: Not on file  ?Physical Activity: Not on file  ?Stress: Not on file  ?Social Connections: Not on file  ?Intimate Partner Violence: Not on file  ? ? ?Review of Systems  ?Constitutional:  Negative for fatigue and unexpected weight change.  ?Eyes:  Negative for visual disturbance.  ?Respiratory:  Negative for cough, chest tightness and shortness of breath.   ?Cardiovascular:  Negative for chest pain, palpitations and leg swelling.  ?Gastrointestinal:  Negative for abdominal pain and blood in stool.  ?Neurological:  Negative for dizziness, light-headedness and headaches.  ? ? ?Objective:  ? ?Vitals:  ? 12/03/21 0907  ?BP: 128/76  ?Pulse: 71  ?Resp: 16  ?Temp: 98.2 ?F (36.8 ?C)  ?TempSrc: Temporal  ?SpO2: 98%  ?Weight: 187 lb 6.4 oz (85 kg)  ?Height: 6' (1.829 m)  ? ? ? ?Physical Exam ?Vitals reviewed.  ?Constitutional:   ?   Appearance: He is well-developed.  ?HENT:  ?   Head: Normocephalic and atraumatic.  ?Neck:  ?   Vascular: No carotid bruit or JVD.  ?Cardiovascular:  ?   Rate and Rhythm: Normal rate and regular rhythm.  ?   Heart sounds: Normal heart sounds. No murmur  heard. ?Pulmonary:  ?   Effort: Pulmonary effort is normal.  ?   Breath sounds: Normal breath sounds. No rales.  ?Musculoskeletal:  ?   Right lower leg: No edema.  ?   Left lower leg: No edema.  ?Skin: ?   General: Ski

## 2021-12-03 NOTE — Patient Instructions (Addendum)
Keep working on quitting smoking. Let me know if we can help. See resources below.  ?No change in crestor dose or Adderall at this time.  Let me know when you need a refill and we can send in a 22-monthprescription at that time then another 345-monthrescription if working well, 6-22-monthllow-up.  No med changes for now. I recommend avoiding energy drinks - continue to work on water intake.  ? ?Managing the Challenge of Quitting Smoking ?Quitting smoking is a physical and mental challenge. You may have cravings, withdrawal symptoms, and temptation to smoke. Before quitting, work with your health care provider to make a plan that can help you manage quitting. Making a plan before you quit may keep you from smoking when you have the urge to smoke while trying to quit. ?How to manage lifestyle changes ?Managing stress ?Stress can make you want to smoke, and wanting to smoke may cause stress. It is important to find ways to manage your stress. You could try some of the following: ?Practice relaxation techniques. ?Breathe slowly and deeply, in through your nose and out through your mouth. ?Listen to music. ?Soak in a bath or take a shower. ?Imagine a peaceful place or vacation. ?Get some support. ?Talk with family or friends about your stress. ?Join a support group. ?Talk with a counselor or therapist. ?Get some physical activity. ?Go for a walk, run, or bike ride. ?Play a favorite sport. ?Practice yoga. ? ?Medicines ?Talk with your health care provider about medicines that might help you deal with cravings and make quitting easier for you. ?Relationships ?Social situations can be difficult when you are quitting smoking. To manage this, you can: ?Avoid parties and other social situations where people might be smoking. ?Avoid alcohol. ?Leave right away if you have the urge to smoke. ?Explain to your family and friends that you are quitting smoking. Ask for support and let them know you might be a bit grumpy. ?Plan  activities where smoking is not an option. ?General instructions ?Be aware that many people gain weight after they quit smoking. However, not everyone does. To keep from gaining weight, have a plan in place before you quit, and stick to the plan after you quit. Your plan should include: ?Eating healthy snacks. When you have a craving, it may help to: ?Eat popcorn, or try carrots, celery, or other cut vegetables. ?Chew sugar-free gum. ?Changing how you eat. ?Eat small portion sizes at meals. ?Eat 4-6 small meals throughout the day instead of 1-2 large meals a day. ?Be mindful when you eat. You should avoid watching television or doing other things that might distract you as you eat. ?Exercising regularly. ?Make time to exercise each day. If you do not have time for a long workout, do short bouts of exercise for 5-10 minutes several times a day. ?Do some form of strengthening exercise, such as weight lifting. ?Do some exercise that gets your heart beating and causes you to breathe deeply, such as walking fast, running, swimming, or biking. This is very important. ?Drinking plenty of water or other low-calorie or no-calorie drinks. Drink enough fluid to keep your urine pale yellow. ? ?How to recognize withdrawal symptoms ?Your body and mind may experience discomfort as you try to get used to not having nicotine in your system. These effects are called withdrawal symptoms. They may include: ?Feeling hungrier than normal. ?Having trouble concentrating. ?Feeling irritable or restless. ?Having trouble sleeping. ?Feeling depressed. ?Craving a cigarette. ?These symptoms may surprise you,  but they are normal to have when quitting smoking. ?To manage withdrawal symptoms: ?Avoid places, people, and activities that trigger your cravings. ?Remember why you want to quit. ?Get plenty of sleep. ?Avoid coffee and other drinks that contain caffeine. These may worsen some of your symptoms. ?How to manage cravings ?Come up with a plan  for how to deal with your cravings. The plan should include the following: ?A definition of the specific situation you want to deal with. ?An activity or action you will take to replace smoking. ?A clear idea for how this action will help. ?The name of someone who could help you with this. ?Cravings usually last for 5-10 minutes. Consider taking the following actions to help you with your plan to deal with cravings: ?Keep your mouth busy. ?Chew sugar-free gum. ?Suck on hard candies or a straw. ?Brush your teeth. ?Keep your hands and body busy. ?Change to a different activity right away. ?Squeeze or play with a ball. ?Do an activity or a hobby, such as making bead jewelry, practicing needlepoint, or working with wood. ?Mix up your normal routine. ?Take a short exercise break. Go for a quick walk, or run up and down stairs. ?Focus on doing something kind or helpful for someone else. ?Call a friend or family member to talk during a craving. ?Join a support group. ?Contact a quitline. ?Where to find support ?To get help or find a support group: ?Call the [facility]'s Smoking Quitline: 1-800-QUIT-NOW 352-036-1958) ?Text QUIT to SmokefreeTXT: 378588 ?Where to find more information ?Visit these websites to find more information on quitting smoking: ?U.S. Department of Health and Human Services: www.smokefree.gov ?American Lung Association: www.freedomfromsmoking.org ?Centers for Disease Control and Prevention (CDC): http://www.wolf.info/ ?American Heart Association: www.heart.org ?Contact a health care provider if: ?You want to change your plan for quitting. ?The medicines you are taking are not helping. ?Your eating feels out of control or you cannot sleep. ?You feel depressed or become very anxious. ?Summary ?Quitting smoking is a physical and mental challenge. You will face cravings, withdrawal symptoms, and temptation to smoke again. Preparation can help you as you go through these challenges. ?Try different  techniques to manage stress, handle social situations, and prevent weight gain. ?You can deal with cravings by keeping your mouth busy (such as by chewing gum), keeping your hands and body busy, calling family or friends, or contacting a quitline for people who want to quit smoking. ?You can deal with withdrawal symptoms by avoiding places where people smoke, getting plenty of rest, and avoiding drinks that contain caffeine. ?This information is not intended to replace advice given to you by your health care provider. Make sure you discuss any questions you have with your health care provider. ?Document Revised: 07/06/2021 Document Reviewed: 07/06/2021 ?Elsevier Patient Education ? [place]. ? ? ?

## 2021-12-10 ENCOUNTER — Ambulatory Visit: Payer: BC Managed Care – PPO | Admitting: Orthopedic Surgery

## 2021-12-10 DIAGNOSIS — M7541 Impingement syndrome of right shoulder: Secondary | ICD-10-CM

## 2021-12-10 DIAGNOSIS — R200 Anesthesia of skin: Secondary | ICD-10-CM | POA: Diagnosis not present

## 2021-12-11 ENCOUNTER — Encounter: Payer: Self-pay | Admitting: Orthopedic Surgery

## 2021-12-11 NOTE — Progress Notes (Signed)
? ?Office Visit Note ?  ?Patient: Louis Zimmerman           ?Date of Birth: 07/06/1961           ?MRN: [id number] ?Visit Date: 12/10/2021 ?Requested by: [name], MD ?[address] ?[place],  [place] [address] ?PCP: [name], MD ? ?Subjective: ?Chief Complaint  ?Patient presents with  ? Other  ?   ?Scan review shoulder  ? ? ?HPI: Louis Zimmerman is a 61 year old patient with right shoulder pain.  Since he was last seen he has had an MRI scan which is reviewed.  That scan does show about a 2 cm retracted supraspinatus tear.  Patient has had lower trapezius tendon transfer on the left-hand side.  Functional and doing well with that.  The right-hand side has some popping and grinding but overall the patient is able to manage with the shoulder in its current condition.  He also has left carpal tunnel syndrome and did reasonably well with right carpal tunnel release although he did have an incisional problem from early activity.  That healed up nevertheless. ?             ?ROS: All systems reviewed are negative as they relate to the chief complaint within the history of present illness.  Patient denies  fevers or chills. ? ? ?Assessment & Plan: ?Visit Diagnoses:  ?1. Impingement syndrome of right shoulder   ?2. Hand numbness   ? ? ?Plan: Impression is right shoulder rotator cuff tear which currently looks to be fixable with no significant muscle atrophy in the supraspinatus.  I think that to avoid having to have a tendon transfer or possible further surgery in the future he should consider rotator cuff repair at this time.  Regarding the left carpal tunnel surgery that is an easier recovery which he understands and could have the surgery with relatively quick recovery in terms of time out of work.  The risk and benefits are discussed with the patient.  He understands and we will likely get the wrist fixed first prior to the shoulder. ? ?Follow-Up Instructions: No follow-ups on file.  ? ?Orders:  ?No orders of  the defined types were placed in this encounter. ? ?No orders of the defined types were placed in this encounter. ? ? ? ? Procedures: ?No procedures performed ? ? ?Clinical Data: ?No additional findings. ? ?Objective: ?Vital Signs: There were no vitals taken for this visit. ? ?Physical Exam:  ? ?Constitutional: Patient appears well-developed ?HEENT:  ?Head: Normocephalic ?Eyes:EOM are normal ?Neck: Normal range of motion ?Cardiovascular: Normal rate ?Pulmonary/chest: Effort normal ?Neurologic: Patient is alert ?Skin: Skin is warm ?Psychiatric: Patient has normal mood and affect ? ? ?Ortho Exam: Ortho exam demonstrates some coarse grinding and crepitus on the right with internal/external rotation of the arm.  He has 5- out of 5 supraspinatus and infraspinatus strength on the right.  No Popeye deformity.  5 out of 5 grip EPL FPL interosseous wrist flexion extension bicep tricep deltoid strength is present.  No abductor pollicis brevis wasting on the left.  EPL FPL interosseous intact on the left-hand side.  Negative Tinel's cubital tunnel on the left-hand side. ? ?Specialty Comments:  ?No specialty comments available. ? ?Imaging: ?No results found. ? ? ?PMFS History: ?Patient Active Problem List  ? Diagnosis Date Noted  ? Coronary artery calcification   ? Hyperlipidemia   ? Rotator cuff arthropathy 02/16/2019  ? FHx: colon cancer 12/18/2018  ?  Attention deficit disorder (ADD) in adult 11/10/2017  ? Moderate episode of recurrent major depressive disorder ([place]) 08/31/2017  ? 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/31/2017  ? Tobacco abuse 08/31/2017  ? History of substance abuse ([place]) 02/23/2017  ? Depression 02/02/2013  ? ?Past Medical History:  ?Diagnosis Date  ? ADD (attention deficit disorder)   ? Anxiety   ? Arthritis   ? feet, shoulders  ? Cancer [facility])   ? skin cancer  ? Carpal tunnel syndrome   ? Chronic dyspnea   ? COPD (chronic obstructive pulmonary disease) ([place])   ? Coronary artery calcification   ? noted on CT in  10/2020  ? Depression   ? Hyperlipidemia   ? Pre-diabetes   ? Tobacco abuse   ?  ?Family History  ?Problem Relation Age of Onset  ? Cancer Mother   ? Dementia Mother   ? Diabetes Mother   ? Heart Problems Mother   ? Heart disease Mother   ? Liver cancer Father   ? Colon cancer Father   ? Alcohol abuse Maternal Grandfather   ? Alcohol abuse Paternal Grandfather   ? Colon polyps Sister   ? Colon polyps Sister   ? Esophageal cancer Neg Hx   ? Stomach cancer Neg Hx   ? Rectal cancer Neg Hx   ?  ?Past Surgical History:  ?Procedure Laterality Date  ? CARPAL TUNNEL RELEASE Right 11/13/2020  ? Procedure: RIGHT CARPAL TUNNEL RELEASE;  Surgeon: Meredith Pel, MD;  Location: [place];  Service: Orthopedics;  Laterality: Right;  ? CHOLECYSTECTOMY    ? COLONOSCOPY    ? 2000  ? GALLBLADDER SURGERY  2003  ? SHOULDER ARTHROSCOPY Left 02/16/2019  ? LEFT SHOULDER ARTHROSCOPY LOWER TRAPEZIUS TENDON TRANSFER, BICEPS TENODESIS  ? SHOULDER ARTHROSCOPY WITH DEBRIDEMENT AND BICEP TENDON REPAIR Left 02/16/2019  ? Procedure: LEFT SHOULDER ARTHROSCOPY LOWER TRAPEZIUS TENDON TRANSFER, BICEPS TENODESIS;  Surgeon: Meredith Pel, MD;  Location: [place];  Service: Orthopedics;  Laterality: Left;  ? SKIN BIOPSY  2019  ? ?Social History  ? ?Occupational History  ? Not on file  ?Tobacco Use  ? Smoking status: Every Day  ?  Packs/day: 1.00  ?  Years: 50.00  ?  Pack years: 50.00  ?  Types: Cigarettes  ? Smokeless tobacco: Never  ?Vaping Use  ? Vaping Use: Former  ?Substance and Sexual Activity  ? Alcohol use: No  ? Drug use: No  ?  Frequency: 7.0 times per week  ?  Types: IV  ?  Comment: history of Rx opoid dependency  ? Sexual activity: Not Currently  ? ? ? ? ? ?

## 2022-01-07 ENCOUNTER — Ambulatory Visit: Payer: BC Managed Care – PPO | Admitting: Podiatry

## 2022-01-07 DIAGNOSIS — M7752 Other enthesopathy of left foot: Secondary | ICD-10-CM | POA: Diagnosis not present

## 2022-01-07 DIAGNOSIS — M7751 Other enthesopathy of right foot: Secondary | ICD-10-CM | POA: Diagnosis not present

## 2022-01-07 MED ORDER — BETAMETHASONE SOD PHOS & ACET 6 (3-3) MG/ML IJ SUSP
3.0000 mg | Freq: Once | INTRAMUSCULAR | Status: AC
  Administered 2022-01-07: 3 mg via INTRA_ARTICULAR

## 2022-01-07 NOTE — Progress Notes (Signed)
   HPI: 61 y.o. male presenting today as a new patient for second opinion regarding pain and tenderness to the patient's bilateral feet this been going on for several months.  He has seen orthopedics in the past who recommended arthrodesis of the great toe joint.  He presents for further treatment and evaluation.  Past Medical History:  Diagnosis Date   ADD (attention deficit disorder)    Anxiety    Arthritis    feet, shoulders   Cancer ([place])    skin cancer   Carpal tunnel syndrome    Chronic dyspnea    COPD (chronic obstructive pulmonary disease) ([name])    Coronary artery calcification    noted on CT in 10/2020   Depression    Hyperlipidemia    Pre-diabetes    Tobacco abuse      Physical Exam: General: The patient is alert and oriented x3 in no acute distress.  Dermatology: Skin is warm, dry and supple bilateral lower extremities. Negative for open lesions or macerations.  Vascular: Palpable pedal pulses bilaterally. No edema or erythema noted. Capillary refill within normal limits.  Neurological: Epicritic and protective threshold grossly intact bilaterally.   Musculoskeletal Exam: Range of motion within normal limits to all pedal and ankle joints bilateral. Muscle strength 5/5 in all groups bilateral.  Clinical evidence of a hallux limitus with enlargement of the first MTP joint and lateral deviation of the fifth metatarsal head consistent with a tailor's bunion deformity  Radiographic Exam:  Normal osseous mineralization.  Advanced degenerative changes with periarticular spurring noted to the first MTP joint bilateral.  Increased intermetatarsal space between the fourth and fifth metatarsals with lateral deviation of the fifth metatarsal head noted to the right foot  Assessment: 1.  Degenerative hallux limitus bilateral 2.  First MTP capsulitis bilateral 3.  Tailor's bunion right   Plan of Care:  1. Patient evaluated. X-Rays reviewed.  2.  Injection of 0.5 cc Celestone  Soluspan injection of the first MTP bilateral 3.  Prescription for diclofenac 75 mg 2 times daily as needed 4.  Recommend good supportive shoes that support the arches of the foot and limit motion of the forefoot 5.  Today we did discuss different surgical options given the advanced degenerative changes to the first MTP joint.  Patient would do well with first MTP implants versus arthrodesis.  Pros and cons of each were discussed 6.  Return to clinic as needed  *Recruitment consultant     Edrick Kins, DPM [facility]  Dr. Edrick Kins, DPM    [address], [place] [address]                Office [phone]  Fax [phone]

## 2022-01-23 ENCOUNTER — Other Ambulatory Visit: Payer: Self-pay | Admitting: Family Medicine

## 2022-01-23 DIAGNOSIS — F988 Other specified behavioral and emotional disorders with onset usually occurring in childhood and adolescence: Secondary | ICD-10-CM

## 2022-01-23 MED ORDER — AMPHETAMINE-DEXTROAMPHETAMINE 15 MG PO TABS: 15.0000 mg | ORAL_TABLET | Freq: Every day | ORAL | 0 refills | Status: DC

## 2022-01-23 NOTE — Telephone Encounter (Signed)
Medication last discussed May 8.  Stable control at that time, continued 15 mg in the morning, 7.5 mg in the afternoon dosing, 45 filled on 12/31/2021.  Should not need refill for a few more days, but prescription will be ordered for 7/3 as holiday on 7/4. let me know if there are questions or if there is an earlier refill needed if traveling.

## 2022-02-03 ENCOUNTER — Other Ambulatory Visit: Payer: Self-pay | Admitting: Family Medicine

## 2022-02-03 DIAGNOSIS — F17210 Nicotine dependence, cigarettes, uncomplicated: Secondary | ICD-10-CM

## 2022-02-04 MED ORDER — VARENICLINE TARTRATE 0.5 MG X 11 & 1 MG X 42 PO TBPK: ORAL_TABLET | ORAL | 0 refills | Status: DC

## 2022-02-12 ENCOUNTER — Telehealth: Payer: Self-pay | Admitting: Orthopedic Surgery

## 2022-02-12 NOTE — Telephone Encounter (Signed)
Patient came by the office wanting to get ahead on scheduling his right shoulder surgery.  He states the left carpal tunnel surgery can wait for now.  He has asked we hold a date of 03-12-22 for him. Please provide surgery sheet if surgery is in order.  Patient's # is [phone] [id number] should you need to speak with him prior to scheduling.

## 2022-02-13 NOTE — Telephone Encounter (Signed)
Blue sheet done thx

## 2022-03-11 ENCOUNTER — Encounter: Payer: BC Managed Care – PPO | Admitting: Orthopedic Surgery

## 2022-03-13 ENCOUNTER — Encounter (HOSPITAL_COMMUNITY): Payer: Self-pay | Admitting: Orthopedic Surgery

## 2022-03-13 ENCOUNTER — Other Ambulatory Visit: Payer: Self-pay

## 2022-03-13 NOTE — Progress Notes (Signed)
PCP - Dr. Maxie Better  Cardiologist - Dr. Audie Box  EP- Denies  Endocrine- Denies  Pulm- Denies  Chest x-ray - Denies  EKG - 10/05/21 (E)  Stress Test - Denies  ECHO - 11/10/20 (E)  Cardiac Cath - 10/13/20 (E)  AICD-na PM-na LOOP-na  Nerve Stimulator- Denies  Dialysis- Denies  Sleep Study - Denies CPAP - Denies  LABS- 03/14/22: CBC, BMP  ASA- LD- 8/16  ERAS- Yes- clears until 1250  HA1C- Denies  Anesthesia- No  Pt denies having chest pain, sob, or fever during the pre-op phone call. All instructions explained to the pt, with a verbal understanding of the material including: as of today, stop taking all Aspirin (unless instructed by your doctor) and Other Aspirin containing products, Vitamins, Fish oils, and Herbal medications. Also stop all NSAIDS i.e. Advil, Ibuprofen, Motrin, Aleve, Anaprox, Naproxen, BC, Goody Powders, and all Supplements.  Pt also instructed to wear a mask and social distance if he goes out. The opportunity to ask questions was provided.

## 2022-03-14 ENCOUNTER — Ambulatory Visit (HOSPITAL_COMMUNITY): Payer: BC Managed Care – PPO | Admitting: Anesthesiology

## 2022-03-14 ENCOUNTER — Encounter (HOSPITAL_COMMUNITY): Payer: Self-pay | Admitting: Orthopedic Surgery

## 2022-03-14 ENCOUNTER — Encounter (HOSPITAL_COMMUNITY)
Admission: RE | Disposition: A | Discharge status: Home / Self Care | Payer: Self-pay | Source: Home / Self Care | Attending: Orthopedic Surgery

## 2022-03-14 ENCOUNTER — Ambulatory Visit (HOSPITAL_COMMUNITY)
Admission: RE | Admit: 2022-03-14 | Discharge: 2022-03-14 | Disposition: A | Discharge status: Home / Self Care | Payer: BC Managed Care – PPO | Attending: Orthopedic Surgery | Admitting: Orthopedic Surgery

## 2022-03-14 ENCOUNTER — Other Ambulatory Visit: Payer: Self-pay

## 2022-03-14 DIAGNOSIS — F418 Other specified anxiety disorders: Secondary | ICD-10-CM | POA: Insufficient documentation

## 2022-03-14 DIAGNOSIS — X58XXXA Exposure to other specified factors, initial encounter: Secondary | ICD-10-CM | POA: Diagnosis not present

## 2022-03-14 DIAGNOSIS — M75121 Complete rotator cuff tear or rupture of right shoulder, not specified as traumatic: Secondary | ICD-10-CM

## 2022-03-14 DIAGNOSIS — S46011A Strain of muscle(s) and tendon(s) of the rotator cuff of right shoulder, initial encounter: Secondary | ICD-10-CM | POA: Diagnosis not present

## 2022-03-14 DIAGNOSIS — I2510 Atherosclerotic heart disease of native coronary artery without angina pectoris: Secondary | ICD-10-CM | POA: Insufficient documentation

## 2022-03-14 DIAGNOSIS — S43431A Superior glenoid labrum lesion of right shoulder, initial encounter: Secondary | ICD-10-CM

## 2022-03-14 DIAGNOSIS — J449 Chronic obstructive pulmonary disease, unspecified: Secondary | ICD-10-CM | POA: Insufficient documentation

## 2022-03-14 DIAGNOSIS — M7521 Bicipital tendinitis, right shoulder: Secondary | ICD-10-CM | POA: Diagnosis not present

## 2022-03-14 DIAGNOSIS — F17210 Nicotine dependence, cigarettes, uncomplicated: Secondary | ICD-10-CM | POA: Diagnosis not present

## 2022-03-14 DIAGNOSIS — M659 Synovitis and tenosynovitis, unspecified: Secondary | ICD-10-CM | POA: Diagnosis not present

## 2022-03-14 DIAGNOSIS — G8918 Other acute postprocedural pain: Secondary | ICD-10-CM | POA: Diagnosis not present

## 2022-03-14 DIAGNOSIS — G5602 Carpal tunnel syndrome, left upper limb: Secondary | ICD-10-CM | POA: Insufficient documentation

## 2022-03-14 DIAGNOSIS — M19011 Primary osteoarthritis, right shoulder: Secondary | ICD-10-CM | POA: Insufficient documentation

## 2022-03-14 DIAGNOSIS — Z01818 Encounter for other preprocedural examination: Secondary | ICD-10-CM

## 2022-03-14 DIAGNOSIS — M75101 Unspecified rotator cuff tear or rupture of right shoulder, not specified as traumatic: Secondary | ICD-10-CM | POA: Diagnosis not present

## 2022-03-14 HISTORY — PX: BICEPT TENODESIS: SHX5116

## 2022-03-14 HISTORY — PX: SHOULDER ARTHROSCOPY WITH OPEN ROTATOR CUFF REPAIR AND DISTAL CLAVICLE ACROMINECTOMY: SHX5683

## 2022-03-14 LAB — CBC
HCT: 44.2 % (ref 39.0–52.0)
Hemoglobin: 15.3 g/dL (ref 13.0–17.0)
MCH: 31.8 pg (ref 26.0–34.0)
MCHC: 34.6 g/dL (ref 30.0–36.0)
MCV: 91.9 fL (ref 80.0–100.0)
Platelets: 305 10*3/uL (ref 150–400)
RBC: 4.81 MIL/uL (ref 4.22–5.81)
RDW: 13.3 % (ref 11.5–15.5)
WBC: 7.9 10*3/uL (ref 4.0–10.5)
nRBC: 0 % (ref 0.0–0.2)

## 2022-03-14 LAB — BASIC METABOLIC PANEL
Anion gap: 5 (ref 5–15)
BUN: 12 mg/dL (ref 6–20)
CO2: 23 mmol/L (ref 22–32)
Calcium: 9.4 mg/dL (ref 8.9–10.3)
Chloride: 109 mmol/L (ref 98–111)
Creatinine, Ser: 1.09 mg/dL (ref 0.61–1.24)
GFR, Estimated: >60 mL/min (ref >60)
Glucose, Bld: 87 mg/dL (ref 70–99)
Potassium: 3.7 mmol/L (ref 3.5–5.1)
Sodium: 137 mmol/L (ref 135–145)

## 2022-03-14 LAB — SURGICAL PCR SCREEN
MRSA, PCR: NEGATIVE
Staphylococcus aureus: POSITIVE — AB

## 2022-03-14 SURGERY — SHOULDER ARTHROSCOPY WITH OPEN ROTATOR CUFF REPAIR AND DISTAL CLAVICLE ACROMINECTOMY
Anesthesia: Regional | Laterality: Right

## 2022-03-14 MED ORDER — ROCURONIUM BROMIDE 10 MG/ML (PF) SYRINGE
PREFILLED_SYRINGE | INTRAVENOUS | Status: DC | PRN
  Administered 2022-03-14: 20 mg via INTRAVENOUS
  Administered 2022-03-14: 60 mg via INTRAVENOUS

## 2022-03-14 MED ORDER — PROPOFOL 10 MG/ML IV BOLUS
INTRAVENOUS | Status: DC | PRN
  Administered 2022-03-14: 180 mg via INTRAVENOUS

## 2022-03-14 MED ORDER — FENTANYL CITRATE (PF) 100 MCG/2ML IJ SOLN
INTRAMUSCULAR | Status: AC
  Filled 2022-03-14: qty 2

## 2022-03-14 MED ORDER — FENTANYL CITRATE (PF) 100 MCG/2ML IJ SOLN: 100.0000 ug | Freq: Once | INTRAMUSCULAR | Status: AC

## 2022-03-14 MED ORDER — EPHEDRINE SULFATE-NACL 50-0.9 MG/10ML-% IV SOSY
PREFILLED_SYRINGE | INTRAVENOUS | Status: DC | PRN
  Administered 2022-03-14: 15 mg via INTRAVENOUS
  Administered 2022-03-14: 10 mg via INTRAVENOUS
  Administered 2022-03-14 (×3): 5 mg via INTRAVENOUS

## 2022-03-14 MED ORDER — OXYCODONE HCL 5 MG/5ML PO SOLN: 5.0000 mg | Freq: Once | ORAL | Status: AC | PRN

## 2022-03-14 MED ORDER — PROPOFOL 10 MG/ML IV BOLUS
INTRAVENOUS | Status: AC
  Filled 2022-03-14: qty 20

## 2022-03-14 MED ORDER — FENTANYL CITRATE (PF) 100 MCG/2ML IJ SOLN
25.0000 ug | INTRAMUSCULAR | Status: DC | PRN
  Administered 2022-03-14: 50 ug via INTRAVENOUS

## 2022-03-14 MED ORDER — POVIDONE-IODINE 10 % EX SWAB: 2.0000 | Freq: Once | CUTANEOUS | Status: DC

## 2022-03-14 MED ORDER — TRANEXAMIC ACID-NACL 1000-0.7 MG/100ML-% IV SOLN
1000.0000 mg | INTRAVENOUS | Status: AC
  Administered 2022-03-14: 1000 mg via INTRAVENOUS
  Filled 2022-03-14: qty 100

## 2022-03-14 MED ORDER — EPINEPHRINE PF 1 MG/ML IJ SOLN
INTRAMUSCULAR | Status: DC | PRN
  Administered 2022-03-14: 1 mg

## 2022-03-14 MED ORDER — IRRISEPT - 450ML BOTTLE WITH 0.05% CHG IN STERILE WATER, USP 99.95% OPTIME
TOPICAL | Status: DC | PRN
  Administered 2022-03-14: 450 mL

## 2022-03-14 MED ORDER — 0.9 % SODIUM CHLORIDE (POUR BTL) OPTIME
TOPICAL | Status: DC | PRN
  Administered 2022-03-14: 1000 mL

## 2022-03-14 MED ORDER — ONDANSETRON HCL 4 MG/2ML IJ SOLN
INTRAMUSCULAR | Status: AC
  Filled 2022-03-14: qty 2

## 2022-03-14 MED ORDER — AMISULPRIDE (ANTIEMETIC) 5 MG/2ML IV SOLN: 10.0000 mg | Freq: Once | INTRAVENOUS | Status: DC | PRN

## 2022-03-14 MED ORDER — OXYCODONE HCL 5 MG PO TABS
ORAL_TABLET | ORAL | Status: AC
  Filled 2022-03-14: qty 1

## 2022-03-14 MED ORDER — CEFAZOLIN SODIUM-DEXTROSE 2-4 GM/100ML-% IV SOLN
2.0000 g | INTRAVENOUS | Status: AC
  Administered 2022-03-14: 2 g via INTRAVENOUS
  Filled 2022-03-14: qty 100

## 2022-03-14 MED ORDER — CHLORHEXIDINE GLUCONATE 0.12 % MT SOLN
15.0000 mL | OROMUCOSAL | Status: AC
  Filled 2022-03-14: qty 15

## 2022-03-14 MED ORDER — POVIDONE-IODINE 7.5 % EX SOLN
Freq: Once | CUTANEOUS | Status: DC
  Filled 2022-03-14: qty 118

## 2022-03-14 MED ORDER — DICLOFENAC SODIUM 75 MG PO TBEC: 75.0000 mg | DELAYED_RELEASE_TABLET | Freq: Two times a day (BID) | ORAL | 0 refills | Status: DC

## 2022-03-14 MED ORDER — LIDOCAINE 2% (20 MG/ML) 5 ML SYRINGE
INTRAMUSCULAR | Status: DC | PRN
  Administered 2022-03-14: 20 mg via INTRAVENOUS

## 2022-03-14 MED ORDER — BUPIVACAINE LIPOSOME 1.3 % IJ SUSP
INTRAMUSCULAR | Status: DC | PRN
  Administered 2022-03-14: 10 mL via PERINEURAL

## 2022-03-14 MED ORDER — DEXAMETHASONE SODIUM PHOSPHATE 10 MG/ML IJ SOLN
INTRAMUSCULAR | Status: AC
  Filled 2022-03-14: qty 1

## 2022-03-14 MED ORDER — METHOCARBAMOL 500 MG PO TABS
500.0000 mg | ORAL_TABLET | Freq: Three times a day (TID) | ORAL | 1 refills | Status: DC | PRN
Start: 2022-03-14 — End: 2024-04-23

## 2022-03-14 MED ORDER — EPINEPHRINE PF 1 MG/ML IJ SOLN
INTRAMUSCULAR | Status: AC
  Filled 2022-03-14: qty 1

## 2022-03-14 MED ORDER — DEXAMETHASONE SODIUM PHOSPHATE 10 MG/ML IJ SOLN
INTRAMUSCULAR | Status: DC | PRN
  Administered 2022-03-14: 10 mg via INTRAVENOUS

## 2022-03-14 MED ORDER — ONDANSETRON HCL 4 MG/2ML IJ SOLN: 4.0000 mg | Freq: Once | INTRAMUSCULAR | Status: DC | PRN

## 2022-03-14 MED ORDER — MIDAZOLAM HCL 2 MG/2ML IJ SOLN: 2.0000 mg | Freq: Once | INTRAMUSCULAR | Status: AC

## 2022-03-14 MED ORDER — BUPIVACAINE HCL (PF) 0.5 % IJ SOLN
INTRAMUSCULAR | Status: DC | PRN
  Administered 2022-03-14: 15 mL via PERINEURAL

## 2022-03-14 MED ORDER — OXYCODONE-ACETAMINOPHEN 5-325 MG PO TABS: 1.0000 | ORAL_TABLET | ORAL | 0 refills | Status: DC | PRN

## 2022-03-14 MED ORDER — FENTANYL CITRATE (PF) 250 MCG/5ML IJ SOLN
INTRAMUSCULAR | Status: AC
  Filled 2022-03-14: qty 5

## 2022-03-14 MED ORDER — MIDAZOLAM HCL 2 MG/2ML IJ SOLN
INTRAMUSCULAR | Status: AC
  Administered 2022-03-14: 2 mg via INTRAVENOUS
  Filled 2022-03-14: qty 2

## 2022-03-14 MED ORDER — SUGAMMADEX SODIUM 200 MG/2ML IV SOLN
INTRAVENOUS | Status: DC | PRN
  Administered 2022-03-14: 200 mg via INTRAVENOUS

## 2022-03-14 MED ORDER — FENTANYL CITRATE (PF) 100 MCG/2ML IJ SOLN
INTRAMUSCULAR | Status: AC
  Administered 2022-03-14: 100 ug via INTRAVENOUS
  Filled 2022-03-14: qty 2

## 2022-03-14 MED ORDER — ONDANSETRON HCL 4 MG/2ML IJ SOLN
INTRAMUSCULAR | Status: DC | PRN
  Administered 2022-03-14: 4 mg via INTRAVENOUS

## 2022-03-14 MED ORDER — BUPIVACAINE HCL (PF) 0.25 % IJ SOLN
INTRAMUSCULAR | Status: AC
Start: 2022-03-14 — End: ?
  Filled 2022-03-14: qty 30

## 2022-03-14 MED ORDER — CHLORHEXIDINE GLUCONATE 0.12 % MT SOLN
OROMUCOSAL | Status: AC
  Administered 2022-03-14: 15 mL
  Filled 2022-03-14: qty 15

## 2022-03-14 MED ORDER — FENTANYL CITRATE (PF) 250 MCG/5ML IJ SOLN
INTRAMUSCULAR | Status: DC | PRN
  Administered 2022-03-14: 100 ug via INTRAVENOUS

## 2022-03-14 MED ORDER — OXYCODONE HCL 5 MG PO TABS
5.0000 mg | ORAL_TABLET | Freq: Once | ORAL | Status: AC | PRN
  Administered 2022-03-14: 5 mg via ORAL

## 2022-03-14 MED ORDER — ACETAMINOPHEN 500 MG PO TABS
1000.0000 mg | ORAL_TABLET | Freq: Once | ORAL | Status: AC
  Administered 2022-03-14: 1000 mg via ORAL
  Filled 2022-03-14: qty 2

## 2022-03-14 MED ORDER — SODIUM CHLORIDE 0.9 % IR SOLN
Status: DC | PRN
  Administered 2022-03-14: 3000 mL

## 2022-03-14 MED ORDER — LACTATED RINGERS IV SOLN: INTRAVENOUS | Status: DC

## 2022-03-14 MED ORDER — PHENYLEPHRINE HCL-NACL 20-0.9 MG/250ML-% IV SOLN
INTRAVENOUS | Status: DC | PRN
  Administered 2022-03-14: 100 ug/min via INTRAVENOUS

## 2022-03-14 SURGICAL SUPPLY — 59 items
ANCHOR FBRTK 2.6 SUTURETAP 1.3 (Anchor) IMPLANT
ANCHOR SUT 1.8 FIBERTAK SB KL (Anchor) IMPLANT
ANCHOR SWIVELOCK BIO 4.75X19.1 (Anchor) IMPLANT
BAG COUNTER SPONGE SURGICOUNT (BAG) ×1 IMPLANT
BLADE SURG 11 STRL SS (BLADE) ×1 IMPLANT
BLADE SURG 15 STRL LF DISP TIS (BLADE) IMPLANT
BLADE SURG 15 STRL SS (BLADE) ×1
CLOSURE STERI STRIP 1/2 X4 (GAUZE/BANDAGES/DRESSINGS) IMPLANT
COVER SURGICAL LIGHT HANDLE (MISCELLANEOUS) ×1 IMPLANT
DRAPE INCISE IOBAN 66X45 STRL (DRAPES) ×2 IMPLANT
DRAPE STERI 35X30 U-POUCH (DRAPES) ×1 IMPLANT
DRAPE U-SHAPE 47X51 STRL (DRAPES) ×2 IMPLANT
DRSG PAD ABDOMINAL 8X10 ST (GAUZE/BANDAGES/DRESSINGS) ×3 IMPLANT
DRSG TEGADERM 4X4.75 (GAUZE/BANDAGES/DRESSINGS) IMPLANT
DRSG XEROFORM 1X8 (GAUZE/BANDAGES/DRESSINGS) IMPLANT
DURAPREP 26ML APPLICATOR (WOUND CARE) ×1 IMPLANT
ELECT REM PT RETURN 9FT ADLT (ELECTROSURGICAL) ×1
ELECTRODE REM PT RTRN 9FT ADLT (ELECTROSURGICAL) ×1 IMPLANT
FILTER STRAW FLUID ASPIR (MISCELLANEOUS) ×1 IMPLANT
GAUZE SPONGE 4X4 12PLY STRL (GAUZE/BANDAGES/DRESSINGS) IMPLANT
GLOVE BIOGEL PI IND STRL 8 (GLOVE) ×1 IMPLANT
GLOVE BIOGEL PI INDICATOR 8 (GLOVE) ×1
GLOVE ECLIPSE 8.0 STRL XLNG CF (GLOVE) ×1 IMPLANT
GOWN STRL REUS W/ TWL LRG LVL3 (GOWN DISPOSABLE) ×3 IMPLANT
GOWN STRL REUS W/TWL LRG LVL3 (GOWN DISPOSABLE) ×3
KIT BASIN OR (CUSTOM PROCEDURE TRAY) ×1 IMPLANT
KIT STR SPEAR 1.8 FBRTK DISP (KITS) IMPLANT
KIT TURNOVER KIT B (KITS) ×1 IMPLANT
MANIFOLD NEPTUNE II (INSTRUMENTS) ×1 IMPLANT
NDL HYPO 25X1 1.5 SAFETY (NEEDLE) ×1 IMPLANT
NDL SCORPION MULTI FIRE (NEEDLE) IMPLANT
NDL SPNL 18GX3.5 QUINCKE PK (NEEDLE) ×1 IMPLANT
NEEDLE HYPO 25X1 1.5 SAFETY (NEEDLE) ×1 IMPLANT
NEEDLE SCORPION MULTI FIRE (NEEDLE) ×1 IMPLANT
NEEDLE SPNL 18GX3.5 QUINCKE PK (NEEDLE) ×1 IMPLANT
NS IRRIG 1000ML POUR BTL (IV SOLUTION) ×1 IMPLANT
PACK SHOULDER (CUSTOM PROCEDURE TRAY) ×1 IMPLANT
PAD ARMBOARD 7.5X6 YLW CONV (MISCELLANEOUS) ×2 IMPLANT
PORT APPOLLO RF 90DEGREE MULTI (SURGICAL WAND) IMPLANT
RESTRAINT HEAD UNIVERSAL NS (MISCELLANEOUS) ×1 IMPLANT
SLING ARM IMMOBILIZER LRG (SOFTGOODS) IMPLANT
SLING ARM IMMOBILIZER MED (SOFTGOODS) IMPLANT
SPONGE T-LAP 4X18 ~~LOC~~+RFID (SPONGE) ×2 IMPLANT
STRIP CLOSURE SKIN 1/2X4 (GAUZE/BANDAGES/DRESSINGS) ×1 IMPLANT
SUCTION FRAZIER HANDLE 10FR (MISCELLANEOUS) ×1
SUCTION TUBE FRAZIER 10FR DISP (MISCELLANEOUS) ×1 IMPLANT
SUT ETHILON 3 0 PS 1 (SUTURE) ×1 IMPLANT
SUT MNCRL AB 3-0 PS2 18 (SUTURE) ×1 IMPLANT
SUT VIC AB 1 CT1 27 (SUTURE) ×2
SUT VIC AB 1 CT1 27XBRD ANBCTR (SUTURE) ×1 IMPLANT
SUT VIC AB 2-0 CT1 27 (SUTURE) ×1
SUT VIC AB 2-0 CT1 TAPERPNT 27 (SUTURE) ×1 IMPLANT
SUT VICRYL 0 UR6 27IN ABS (SUTURE) IMPLANT
SYR TB 1ML LUER SLIP (SYRINGE) ×1 IMPLANT
SYS FBRTK BUTTON 2.6 (Anchor) ×1 IMPLANT
SYSTEM FBRTK BUTTON 2.6 (Anchor) IMPLANT
TOWEL GREEN STERILE (TOWEL DISPOSABLE) ×1 IMPLANT
TUBING ARTHROSCOPY IRRIG 16FT (MISCELLANEOUS) ×1 IMPLANT
WATER STERILE IRR 1000ML POUR (IV SOLUTION) ×1 IMPLANT

## 2022-03-14 NOTE — Anesthesia Preprocedure Evaluation (Signed)
Anesthesia Evaluation  Patient identified by MRN, date of birth, ID band Patient awake    Reviewed: Allergy & Precautions, NPO status , Patient's Chart, lab work & pertinent test results  Airway Mallampati: II  TM Distance: >3 FB Neck ROM: Full    Dental no notable dental hx.    Pulmonary COPD, Current SmokerPatient did not abstain from smoking.,    Pulmonary exam normal        Cardiovascular + CAD   Rhythm:Regular Rate:Normal     Neuro/Psych Anxiety Depression    GI/Hepatic negative GI ROS, Neg liver ROS,   Endo/Other  negative endocrine ROS  Renal/GU negative Renal ROS  negative genitourinary   Musculoskeletal  (+) Arthritis , Osteoarthritis,    Abdominal Normal abdominal exam  (+)   Peds  Hematology negative hematology ROS (+)   Anesthesia Other Findings   Reproductive/Obstetrics                             Anesthesia Physical Anesthesia Plan  ASA: 3  Anesthesia Plan: General and Regional   Post-op Pain Management: Regional block*   Induction: Intravenous  PONV Risk Score and Plan: 1 and Ondansetron, Dexamethasone, Midazolam and Treatment may vary due to age or medical condition  Airway Management Planned: Oral ETT and Mask  Additional Equipment: None  Intra-op Plan:   Post-operative Plan: Extubation in OR  Informed Consent: I have reviewed the patients History and Physical, chart, labs and discussed the procedure including the risks, benefits and alternatives for the proposed anesthesia with the patient or authorized representative who has indicated his/her understanding and acceptance.     Dental advisory given  Plan Discussed with: CRNA  Anesthesia Plan Comments: (Lab Results      Component                Value               Date                      WBC                      7.9                 03/14/2022                HGB                      15.3                 03/14/2022                HCT                      44.2                03/14/2022                MCV                      91.9                03/14/2022                PLT  305                 03/14/2022           Lab Results      Component                Value               Date                      NA                       137                 03/14/2022                K                        3.7                 03/14/2022                CO2                      23                  03/14/2022                GLUCOSE                  87                  03/14/2022                BUN                      12                  03/14/2022                CREATININE               1.09                03/14/2022                CALCIUM                  9.4                 03/14/2022                GFRNONAA                 >60                 03/14/2022          )        Anesthesia Quick Evaluation

## 2022-03-14 NOTE — H&P (Signed)
Louis Zimmerman is an 61 y.o. male.   Chief Complaint: Right shoulder pain HPI: Louis Zimmerman is a 61 year old patient with right shoulder pain.  Since he was last seen he has had an MRI scan which is reviewed.  That scan does show about a 2 cm retracted supraspinatus tear.  Patient has had lower trapezius tendon transfer on the left-hand side.  Functional and doing well with that.  The right-hand side has some popping and grinding but overall the patient is able to manage with the shoulder in its current condition.  He also has left carpal tunnel syndrome and did reasonably well with right carpal tunnel release although he did have an incisional problem from early activity.  That healed up nevertheless.  Past Medical History:  Diagnosis Date   ADD (attention deficit disorder)    Anxiety    Arthritis    feet, shoulders   Cancer ([place])    skin cancer   Carpal tunnel syndrome    Chronic dyspnea    COPD (chronic obstructive pulmonary disease) ([name])    Coronary artery calcification    noted on CT in 10/2020   Depression    Hyperlipidemia    Pre-diabetes    Tobacco abuse     Past Surgical History:  Procedure Laterality Date   CARPAL TUNNEL RELEASE Right 11/13/2020   Procedure: RIGHT CARPAL TUNNEL RELEASE;  Surgeon: Meredith Pel, MD;  Location: [facility];  Service: Orthopedics;  Laterality: Right;   CHOLECYSTECTOMY     COLONOSCOPY     2000   GALLBLADDER SURGERY  2003   SHOULDER ARTHROSCOPY Left 02/16/2019   LEFT SHOULDER ARTHROSCOPY LOWER TRAPEZIUS TENDON TRANSFER, BICEPS TENODESIS   SHOULDER ARTHROSCOPY WITH DEBRIDEMENT AND BICEP TENDON REPAIR Left 02/16/2019   Procedure: LEFT SHOULDER ARTHROSCOPY LOWER TRAPEZIUS TENDON TRANSFER, BICEPS TENODESIS;  Surgeon: Meredith Pel, MD;  Location: [facility];  Service: Orthopedics;  Laterality: Left;   SKIN BIOPSY  2019    Family History  Problem Relation Age of Onset   Cancer Mother    Dementia Mother    Diabetes Mother     Heart Problems Mother    Heart disease Mother    Liver cancer Father    Colon cancer Father    Alcohol abuse Maternal Grandfather    Alcohol abuse Paternal Grandfather    Colon polyps Sister    Colon polyps Sister    Esophageal cancer Neg Hx    Stomach cancer Neg Hx    Rectal cancer Neg Hx    Social History:  reports that he has been smoking cigarettes. He has a 50.00 pack-year smoking history. He has never used smokeless tobacco. He reports that he does not drink alcohol and does not use drugs.  Allergies:  Allergies  Allergen Reactions   Hydrocodone Anaphylaxis   Zoloft [Sertraline] Other (See Comments)    sedating    Medications Prior to Admission  Medication Sig Dispense Refill   acyclovir (ZOVIRAX) 400 MG tablet Take 1 tablet (400 mg total) by mouth 2 (two) times daily. Increase to 2 tabs BID for 5 days if flare. 180 tablet 1   amphetamine-dextroamphetamine (ADDERALL) 15 MG tablet Take 1 tablet by mouth daily. With additional 1/2 tab in afternoon if needed. (Patient taking differently: Take 7.5 mg by mouth See admin instructions. Take 0.5 tablet (7.5 mg) by mouth twice daily (scheduled) & may take an additional 0.5 tablet (7.5 mg) by mouth in the late afternoon if needed for focus.) 45  tablet 0   aspirin EC 81 MG tablet Take 1 tablet (81 mg total) by mouth daily. Swallow whole. (Patient taking differently: Take 81 mg by mouth every evening. Swallow whole.) 30 tablet 3   diclofenac (VOLTAREN) 75 MG EC tablet TAKE ONE TABLET BY MOUTH TWICE DAILY 60 tablet 0   gabapentin (NEURONTIN) 300 MG capsule Take 1 capsule (300 mg total) by mouth every morning AND 2 capsules (600 mg total) at bedtime. 270 capsule 1   ibuprofen (ADVIL) 200 MG tablet Take 400-800 mg by mouth every 8 (eight) hours as needed (pain.).     Naphazoline-Glycerin (CLEAR EYES MAX REDNESS RELIEF) 0.03-0.5 % SOLN Place 1-2 drops into both eyes 3 (three) times daily as needed (dry/irritated eyes.).     rosuvastatin  (CRESTOR) 40 MG tablet Take 1 tablet (40 mg total) by mouth daily. (Patient taking differently: Take 40 mg by mouth at bedtime.) 90 tablet 3   varenicline (CHANTIX PAK) 0.5 MG X 11 & 1 MG X 42 tablet take one 0.'5mg'$  tablet by mouth once daily for 3 days, then increase to one 0.'5mg'$  tablet twice a day for 4 days, then increase to one '1mg'$  tablet twice a day 53 tablet 0   citalopram (CELEXA) 40 MG tablet Take 1 tablet (40 mg total) by mouth daily. (Patient not taking: Reported on 03/12/2022) 90 tablet 1   varenicline (CHANTIX CONTINUING MONTH PAK) 1 MG tablet Take 1 tablet (1 mg total) by mouth 2 (two) times daily. (Patient not taking: Reported on 03/12/2022) 60 tablet 2    Results for orders placed or performed during the hospital encounter of 03/14/22 (from the past 48 hour(s))  CBC     Status: None   Collection Time: 03/14/22  2:18 PM  Result Value Ref Range   WBC 7.9 4.0 - 10.5 K/uL   RBC 4.81 4.22 - 5.81 MIL/uL   Hemoglobin 15.3 13.0 - 17.0 g/dL   HCT 44.2 39.0 - 52.0 %   MCV 91.9 80.0 - 100.0 fL   MCH 31.8 26.0 - 34.0 pg   MCHC 34.6 30.0 - 36.0 g/dL   RDW 13.3 11.5 - 15.5 %   Platelets 305 150 - 400 K/uL   nRBC 0.0 0.0 - 0.2 %    Comment: Performed at [facility], [place] [address] [place], [place] [address]  Basic metabolic panel     Status: None   Collection Time: 03/14/22  2:18 PM  Result Value Ref Range   Sodium 137 135 - 145 mmol/L   Potassium 3.7 3.5 - 5.1 mmol/L   Chloride 109 98 - 111 mmol/L   CO2 23 22 - 32 mmol/L   Glucose, Bld 87 70 - 99 mg/dL    Comment: Glucose reference range applies only to samples taken after fasting for at least 8 hours.   BUN 12 6 - 20 mg/dL   Creatinine, Ser 1.09 0.61 - 1.24 mg/dL   Calcium 9.4 8.9 - 10.3 mg/dL   GFR, Estimated >60 >60 mL/min    Comment: (NOTE) Calculated using the CKD-EPI Creatinine Equation (2021)    Anion gap 5 5 - 15    Comment: Performed at [facility] [address] [place], [place] [address]   No results  found.  Review of Systems  Musculoskeletal:  Positive for arthralgias.  All other systems reviewed and are negative.   Blood pressure 127/89, pulse (!) 58, temperature (!) 97.5 F (36.4 C), temperature source Oral, resp. rate 10,  height 6' (1.829 m), weight 86.2 kg, SpO2 95 %. Physical Exam Vitals reviewed.  HENT:     Head: Normocephalic.     Nose: Nose normal.     Mouth/Throat:     Mouth: Mucous membranes are moist.  Eyes:     Pupils: Pupils are equal, round, and reactive to light.  Cardiovascular:     Rate and Rhythm: Normal rate.     Pulses: Normal pulses.  Pulmonary:     Effort: Pulmonary effort is normal.  Abdominal:     General: Abdomen is flat.  Musculoskeletal:     Cervical back: Normal range of motion.  Skin:    General: Skin is warm.     Capillary Refill: Capillary refill takes less than 2 seconds.  Neurological:     General: No focal deficit present.     Mental Status: He is alert.  Psychiatric:        Mood and Affect: Mood normal.     Ortho exam demonstrates some coarse grinding and crepitus on the right with internal/external rotation of the arm.  He has 5- out of 5 supraspinatus and infraspinatus strength on the right.  No Popeye deformity.  5 out of 5 grip EPL FPL interosseous wrist flexion extension bicep tricep deltoid strength is present.  No abductor pollicis brevis wasting on the left.  EPL FPL interosseous intact on the left-hand side.  Negative Tinel's cubital tunnel on the left-hand side  Assessment/Plan Impression is right shoulder rotator cuff tear which currently looks to be fixable with no significant muscle atrophy in the supraspinatus.  I think that to avoid having to have a tendon transfer or possible further surgery in the future he should consider rotator cuff repair at this time.  Regarding the left carpal tunnel surgery that is an easier recovery which he understands and could have the surgery with relatively quick recovery in terms of time out  of work.  The risk and benefits are discussed with the patient.  He understands and we will likely get the wrist fixed first prior to the shoulder.  Anderson Malta, MD 03/14/2022, 4:26 PM

## 2022-03-14 NOTE — Transfer of Care (Signed)
Immediate Anesthesia Transfer of Care Note  Patient: VIBHAV WADDILL  Procedure(s) Performed: RIGHT SHOULDER ARTHROSCOPY, DEBRIDEMENT, MINI OPEN ROTATOR CUFF TEAR REPAIR (Right) BICEPS TENODESIS (Right)  Patient Location: PACU  Anesthesia Type:GA combined with regional for post-op pain  Level of Consciousness: awake and alert   Airway & Oxygen Therapy: Patient Spontanous Breathing and Patient connected to nasal cannula oxygen  Post-op Assessment: Report given to RN and Post -op Vital signs reviewed and stable  Post vital signs: Reviewed and stable  Last Vitals:  Vitals Value Taken Time  BP 128/78 03/14/22 1951  Temp 36.2 C 03/14/22 1952  Pulse 73 03/14/22 1956  Resp 11 03/14/22 1956  SpO2 96 % 03/14/22 1956  Vitals shown include unvalidated device data.  Last Pain:  Vitals:   03/14/22 1550  TempSrc:   PainSc: 0-No pain      Patients Stated Pain Goal: 3 (28/41/32 4401)  Complications: No notable events documented.

## 2022-03-14 NOTE — Op Note (Signed)
NAME: Louis Zimmerman, Louis Zimmerman MEDICAL RECORD NO: [id number] ACCOUNT NO: [id number] DATE OF BIRTH: 1961-02-06 FACILITY: [facility] LOCATION: [facility] PHYSICIAN: Yetta Barre. [name], MD  Operative Report   DATE OF PROCEDURE: 03/14/2022  PREOPERATIVE DIAGNOSES:  Right shoulder rotator cuff tear and biceps tendinitis.  POSTOPERATIVE DIAGNOSES:  Right shoulder rotator cuff tear, biceps tendinitis and degenerative type 2 superior labral anterior posterior tear.  PROCEDURES:  Right shoulder arthroscopy with limited debridement of superior labrum, biceps tendon release, mini open biceps tenodesis using Arthrex anchors and mini open rotator cuff tear repair of the supraspinatus and part of the infraspinatus tendon.  SURGEON:  Yetta Barre. [name], MD  ASSISTANT:  Annie Main, PA  INDICATIONS:  The patient is a 61 year old patient with right shoulder pain.  He presents for operative management after explanation of risks and benefits.  MRI scan shows biceps tendinitis along with rotator cuff tearing with retraction.  DESCRIPTION OF PROCEDURE:  The patient was brought to the operating room where general anesthetic was induced.  Preoperative antibiotics were administered.  Timeout was called.  The patient was placed in the beach chair position with the head in neutral  position.  Right shoulder, arm and hand prescrubbed with alcohol and Betadine, allowed to air dry, prepped with DuraPrep solution and draped in sterile manner.  Ioban used to seal the operative field and cover the axilla.  After calling timeout,  posterior portal was created 2 cm medial and inferior to the posterolateral margin of acromion.  Diagnostic arthroscopy was performed.  Anterior portal created under direct visualization.  The patient had a type 2 SLAP tear with significant degeneration  of the biceps anchor.  Rotator cuff tear was also visualized involving supraspinatus with retraction as well as part of the infraspinatus.  Biceps tendon was  released.  Glenohumeral articular surfaces intact.  Capsular attachment in the axillary region  intact.  Anterior inferior, posterior inferior glenohumeral ligaments intact.  Following limited debridement and release of the biceps tendon, the instruments were removed.  Portals were closed using 3-0 nylon.  Incision made off the anterolateral margin  of the acromion.  Skin and subcutaneous tissue sharply divided.  Operative field was covered with Ioban prior to making the incision. Next, the deltoid was split between the middle and anterior raphae, which was marked 4 cm from the anterolateral margin  of the acromion with a #1 Vicryl suture.  Bursectomy performed.  Rotator cuff tear identified.  Biceps tendon was then tenodesed into the bicipital groove using 1.9 knotless SutureTak distally and a 3.2 mm knotless SutureTak with 2 limbs proximally.   This gave very good tendon fixation.  Tendon fixation was performed under appropriate tension.  Next, attention was directed towards the rotator cuff tear.  Rotator cuff tear was debrided.  A subacromial decompression performed with a rasp.  Following  debridement, the greater tuberosity was prepared for reattachment using a rongeur.  Seven 0 Vicryl sutures were placed in grasping fashion along the leading edge of the supraspinatus tendon and infraspinatus tendon.  The tear size measured about 3 x 2  cm.  Tendon mobilized quite well.  Following suture placement with Vicryl sutures, two knotless SutureTape medial row anchors were placed.  Using a Scorpion suture passer, the limbs were placed posterior to anterior equally spaced in a crescent-shaped  manner.  Next, the rotator cuff tendon was reduced back to the tuberosity and the SutureTapes were tied and crossed.  Then, the Vicryl sutures were placed into a SwiveLock in the central  portion of the metaphysis.  The SutureTapes were crossed and then  placed into place and secured using SwiveLock anterior and posterior  to the initial SwiveLock.  Secured waterproof construct was achieved.  Next, thorough irrigation was performed using both IrriSept solution and regular irrigation.  Deltoid split was  then closed using #1 Vicryl suture followed by interrupted inverted 0 Vicryl suture, 2-0 Vicryl suture, and 3-0 Monocryl.  Steri-Strips and impervious dressings applied.  The patient tolerated the procedure well without immediate complication.  Luke's  assistance was required at all times for opening, closing, mobilization of tissue, placement of the screws, anchors, and sutures.  His assistance was a medical necessity.   VAI D: 03/14/2022 8:07:53 pm T: 03/14/2022 10:42:00 pm  JOB: [facility] [id number]

## 2022-03-14 NOTE — Anesthesia Procedure Notes (Signed)
Anesthesia Regional Block: Interscalene brachial plexus block   Pre-Anesthetic Checklist: , timeout performed,  Correct Patient, Correct Site, Correct Laterality,  Correct Procedure, Correct Position, site marked,  Risks and benefits discussed,  Surgical consent,  Pre-op evaluation,  At surgeon's request and post-op pain management  Laterality: Right  Prep: chloraprep       Needles:  Injection technique: Single-shot  Needle Type: Echogenic Stimulator Needle     Needle Length: 10cm  Needle Gauge: 20     Additional Needles:   Procedures:,,,, ultrasound used (permanent image in chart),,    Narrative:  Start time: 03/14/2022 3:41 PM End time: 03/14/2022 3:45 PM Injection made incrementally with aspirations every 5 mL.  Performed by: Personally  Anesthesiologist: Lidia Collum, MD  Additional Notes: Standard monitors applied. Skin prepped. Good needle visualization with ultrasound. Injection made in 5cc increments with no resistance to injection. Patient tolerated the procedure well.

## 2022-03-14 NOTE — Brief Op Note (Addendum)
   03/14/2022  8:02 PM  PATIENT:  Louis Zimmerman  61 y.o. male  PRE-OPERATIVE DIAGNOSIS:  right shoulder rotator cuff tear, biceps tendonitis  POST-OPERATIVE DIAGNOSIS:  right shoulder rotator cuff tear, biceps tendonitis  PROCEDURE:  Procedure(s): RIGHT SHOULDER ARTHROSCOPY, DEBRIDEMENT, MINI OPEN ROTATOR CUFF TEAR REPAIR BICEPS TENODESIS  SURGEON:  Surgeon(s): [name], Tonna Corner, MD  ASSISTANT: Annie Main, PA  ANESTHESIA:   general  EBL: 25 ml    Total I/O In: 1000 [I.V.:1000] Out: 20 [Blood:20]  BLOOD ADMINISTERED: none  DRAINS: none   LOCAL MEDICATIONS USED:  none  SPECIMEN:  No Specimen  COUNTS:  YES  TOURNIQUET:  * No tourniquets in log *  DICTATION: .Other Dictation: Dictation Number [id number]  PLAN OF CARE: Discharge to home after PACU  PATIENT DISPOSITION:  PACU - hemodynamically stable  DIAGNOSES: Right shoulder, chronic rotator cuff tear.  Biceps tendinitis and SLAP tear  POST-OPERATIVE DIAGNOSIS: same  PROCEDURE: Open repair chronic rotator cuff tear - 44818 Open subpectoral biceps tenodesis - 56314 Arthroscopic limited debridement - 97026   OPERATIVE FINDING: Exam under anesthesia: Normal Articular space: Normal Chondral surfaces: Normal Biceps:  Biceps tendinitis and SLAP tear Subscapularis: Intact  Supraspinatus: Complete tear  Infraspinatus: Incomplete tear

## 2022-03-14 NOTE — Anesthesia Procedure Notes (Signed)
Procedure Name: Intubation Date/Time: 03/14/2022 5:10 PM  Performed by: Minerva Ends, CRNAPre-anesthesia Checklist: Patient identified, Emergency Drugs available, Suction available and Patient being monitored Patient Re-evaluated:Patient Re-evaluated prior to induction Oxygen Delivery Method: Circle system utilized Preoxygenation: Pre-oxygenation with 100% oxygen Induction Type: IV induction Ventilation: Mask ventilation without difficulty Laryngoscope Size: Mac and 3 Grade View: Grade I Tube type: Oral Tube size: 7.0 mm Number of attempts: 1 Airway Equipment and Method: Stylet and Oral airway Placement Confirmation: ETT inserted through vocal cords under direct vision, positive ETCO2 and breath sounds checked- equal and bilateral Secured at: 23 cm Tube secured with: Tape Dental Injury: Teeth and Oropharynx as per pre-operative assessment

## 2022-03-14 NOTE — Anesthesia Postprocedure Evaluation (Signed)
Anesthesia Post Note  Patient: Louis Zimmerman  Procedure(s) Performed: RIGHT SHOULDER ARTHROSCOPY, DEBRIDEMENT, MINI OPEN ROTATOR CUFF TEAR REPAIR (Right) BICEPS TENODESIS (Right)     Patient location during evaluation: PACU Anesthesia Type: Regional and General Level of consciousness: awake and alert Pain management: pain level controlled Vital Signs Assessment: post-procedure vital signs reviewed and stable Respiratory status: spontaneous breathing, nonlabored ventilation and respiratory function stable Cardiovascular status: blood pressure returned to baseline and stable Postop Assessment: no apparent nausea or vomiting Anesthetic complications: no   No notable events documented.  Last Vitals:  Vitals:   03/14/22 2015 03/14/22 2030  BP: 115/69 114/74  Pulse: 63 61  Resp: 13 10  Temp:  36.7 C  SpO2: 92% 92%    Last Pain:  Vitals:   03/14/22 2030  TempSrc:   PainSc: 4                  Shanvi Moyd,W. EDMOND

## 2022-03-15 ENCOUNTER — Encounter (HOSPITAL_COMMUNITY): Payer: Self-pay | Admitting: Orthopedic Surgery

## 2022-03-16 DIAGNOSIS — S43431A Superior glenoid labrum lesion of right shoulder, initial encounter: Secondary | ICD-10-CM

## 2022-03-16 DIAGNOSIS — M75121 Complete rotator cuff tear or rupture of right shoulder, not specified as traumatic: Secondary | ICD-10-CM

## 2022-03-18 ENCOUNTER — Encounter: Payer: BC Managed Care – PPO | Admitting: Orthopedic Surgery

## 2022-03-21 ENCOUNTER — Other Ambulatory Visit: Payer: Self-pay | Admitting: Family Medicine

## 2022-03-21 DIAGNOSIS — F988 Other specified behavioral and emotional disorders with onset usually occurring in childhood and adolescence: Secondary | ICD-10-CM

## 2022-03-22 ENCOUNTER — Ambulatory Visit (INDEPENDENT_AMBULATORY_CARE_PROVIDER_SITE_OTHER): Payer: BC Managed Care – PPO | Admitting: Surgical

## 2022-03-22 ENCOUNTER — Encounter: Payer: Self-pay | Admitting: Orthopedic Surgery

## 2022-03-22 DIAGNOSIS — Z9889 Other specified postprocedural states: Secondary | ICD-10-CM

## 2022-03-22 MED ORDER — AMPHETAMINE-DEXTROAMPHETAMINE 15 MG PO TABS: 15.0000 mg | ORAL_TABLET | Freq: Every day | ORAL | 0 refills | Status: DC

## 2022-03-22 NOTE — Progress Notes (Signed)
Post-Op Visit Note   Patient: Louis Zimmerman           Date of Birth: [birth year]           MRN: [id number] Visit Date: 03/22/2022 PCP: [name], MD   Assessment & Plan:  Chief Complaint:  Chief Complaint  Patient presents with   Right Shoulder - Routine Post Op    03/14/22 right shoulder scope; DOA; BT; [facility]   Visit Diagnoses:  1. Status post rotator cuff repair     Plan: Louis Zimmerman is a 61 y.o. male who presents s/p right shoulder rotator cuff repair and biceps tenodesis on 03/14/2022.  Patient is doing well and pain is overall controlled.  Up to 90 degrees on CPM machine.  Denies any chest pain, SOB, fevers, chills.  He has stopped taking narcotic pain medication and is only taking occasional Robaxin, Tylenol, ibuprofen.  Has discontinued sling since Tuesday.  On exam, patient has range of motion 40 degrees external rotation, 80 degrees abduction, 120 degrees forward flexion..  Intact EPL, FPL, finger abduction, finger adduction, pronation/supination, bicep, tricep, deltoid of operative extremity.  Axillary nerve intact with deltoid firing.  Incisions are healing well without evidence of infection or dehiscence.  Sutures removed and replaced with Steri-Strips today.  2+ radial pulse of the operative extremity  Plan is continue with using CPM machine.  Strongly cautioned him against any active range of motion of the right shoulder or any lifting with the right arm.  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  He understands and agrees with plan.  Follow-up in 3 weeks for clinical recheck.  He does not want to pursue any sort of formal physical therapy so we will just discussed some exercises he can do at his next appointment.  He is also interested in having his left carpal tunnel released after he had success with right carpal tunnel release in the  past.  We can discuss scheduling for surgery on the left side at the next appointment but want to hold off until his right shoulder is closer to the 2 to 62-monthmark..   Follow-Up Instructions: No follow-ups on file.   Orders:  No orders of the defined types were placed in this encounter.  No orders of the defined types were placed in this encounter.   Imaging: No results found.  PMFS History: Patient Active Problem List   Diagnosis Date Noted   Nontraumatic complete tear of right rotator cuff    Degenerative superior labral anterior-to-posterior (SLAP) tear of right shoulder    Coronary artery calcification    Hyperlipidemia    Biceps tendonitis on right    Rotator cuff arthropathy 02/16/2019   FHx: colon cancer 12/18/2018   Attention deficit disorder (ADD) in adult 11/10/2017   Moderate episode of recurrent major depressive disorder ([facility] 08/31/2017   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/31/2017   Tobacco abuse 08/31/2017   History of substance abuse ([facility] 02/23/2017   Depression 02/02/2013   Past Medical History:  Diagnosis Date   ADD (attention deficit disorder)    Anxiety    Arthritis    feet, shoulders   Cancer ([facility]    skin cancer   Carpal tunnel syndrome    Chronic dyspnea    COPD (chronic obstructive pulmonary disease) ([name])    Coronary artery calcification  noted on CT in 10/2020   Depression    Hyperlipidemia    Pre-diabetes    Tobacco abuse     Family History  Problem Relation Age of Onset   Cancer Mother    Dementia Mother    Diabetes Mother    Heart Problems Mother    Heart disease Mother    Liver cancer Father    Colon cancer Father    Alcohol abuse Maternal Grandfather    Alcohol abuse Paternal Grandfather    Colon polyps Sister    Colon polyps Sister    Esophageal cancer Neg Hx    Stomach cancer Neg Hx    Rectal cancer Neg Hx     Past Surgical History:  Procedure Laterality Date   BICEPT TENODESIS Right 03/14/2022   Procedure: BICEPS  TENODESIS;  Surgeon: Meredith Pel, MD;  Location: [facility];  Service: Orthopedics;  Laterality: Right;   CARPAL TUNNEL RELEASE Right 11/13/2020   Procedure: RIGHT CARPAL TUNNEL RELEASE;  Surgeon: Meredith Pel, MD;  Location: [place];  Service: Orthopedics;  Laterality: Right;   CHOLECYSTECTOMY     COLONOSCOPY     2000   GALLBLADDER SURGERY  2003   SHOULDER ARTHROSCOPY Left 02/16/2019   LEFT SHOULDER ARTHROSCOPY LOWER TRAPEZIUS TENDON TRANSFER, BICEPS TENODESIS   SHOULDER ARTHROSCOPY WITH DEBRIDEMENT AND BICEP TENDON REPAIR Left 02/16/2019   Procedure: LEFT SHOULDER ARTHROSCOPY LOWER TRAPEZIUS TENDON TRANSFER, BICEPS TENODESIS;  Surgeon: Meredith Pel, MD;  Location: [place];  Service: Orthopedics;  Laterality: Left;   SHOULDER ARTHROSCOPY WITH OPEN ROTATOR CUFF REPAIR AND DISTAL CLAVICLE ACROMINECTOMY Right 03/14/2022   Procedure: RIGHT SHOULDER ARTHROSCOPY, DEBRIDEMENT, MINI OPEN ROTATOR CUFF TEAR REPAIR;  Surgeon: Meredith Pel, MD;  Location: [place];  Service: Orthopedics;  Laterality: Right;   SKIN BIOPSY  2019   Social History   Occupational History   Not on file  Tobacco Use   Smoking status: Every Day    Packs/day: 1.00    Years: 50.00    Total pack years: 50.00    Types: Cigarettes   Smokeless tobacco: Never  Vaping Use   Vaping Use: Former  Substance and Sexual Activity   Alcohol use: No   Drug use: No    Frequency: 7.0 times per week    Types: IV    Comment: history of Rx opoid dependency   Sexual activity: Not Currently

## 2022-03-22 NOTE — Telephone Encounter (Signed)
ADHD discussed and continue same regimen on May 8 with 64-monthfollow-up planned.  Controlled substance database reviewed.  Last filled #45 on 01/28/2022, refill ordered.

## 2022-04-07 NOTE — Progress Notes (Unsigned)
Cardiology Office Note:   Date:  04/08/2022  NAME:  Louis Zimmerman    MRN: [id number] DOB:  1961-06-05   PCP:  [name], MD  Cardiologist:  Evalina Field, MD  Electrophysiologist:  None   Referring MD: [name], MD   Chief Complaint  Patient presents with   Follow-up   History of Present Illness:   Louis Zimmerman is a 61 y.o. male with a hx of COPD, coronary calcifications, HLD who presents for follow-up.   He reports he is doing well.  No chest pain with exertion.  He continues to have episodes at night described as burning in his chest.  Improved with Tylenol.  He has had the symptoms for 20 years.  Symptoms occur infrequently.  He does get short of breath with activity.  We did refer him to pulmonary.  He has not seen them.  I did discuss with him that this is a good idea given his COPD which was seen on pulmonary function testing.  He also has a diffusion defect.  His blood pressure is well controlled.  Lipids are improved.  Back on Crestor.  Continuing to work on his diet.  No structured exercise but getting plenty of activity at work.  He is on Chantix.  Smoking 5 to 10 cigarettes/day.  Working on quitting.  Problem List 1. Tobacco abuse  -1-2 ppd 50 years  -mild COPD 2. Coronary calcifications on chest CT -11/08/2020 3. HLD -T chol 160, HDL 36, LDL 96, TG 219  Past Medical History: Past Medical History:  Diagnosis Date   ADD (attention deficit disorder)    Anxiety    Arthritis    feet, shoulders   Cancer ([name])    skin cancer   Carpal tunnel syndrome    Chronic dyspnea    COPD (chronic obstructive pulmonary disease) ([name])    Coronary artery calcification    noted on CT in 10/2020   Depression    Hyperlipidemia    Pre-diabetes    Tobacco abuse     Past Surgical History: Past Surgical History:  Procedure Laterality Date   BICEPT TENODESIS Right 03/14/2022   Procedure: BICEPS TENODESIS;  Surgeon: Meredith Pel, MD;  Location: [facility];   Service: Orthopedics;  Laterality: Right;   CARPAL TUNNEL RELEASE Right 11/13/2020   Procedure: RIGHT CARPAL TUNNEL RELEASE;  Surgeon: Meredith Pel, MD;  Location: [facility];  Service: Orthopedics;  Laterality: Right;   CHOLECYSTECTOMY     COLONOSCOPY     2000   GALLBLADDER SURGERY  2003   SHOULDER ARTHROSCOPY Left 02/16/2019   LEFT SHOULDER ARTHROSCOPY LOWER TRAPEZIUS TENDON TRANSFER, BICEPS TENODESIS   SHOULDER ARTHROSCOPY WITH DEBRIDEMENT AND BICEP TENDON REPAIR Left 02/16/2019   Procedure: LEFT SHOULDER ARTHROSCOPY LOWER TRAPEZIUS TENDON TRANSFER, BICEPS TENODESIS;  Surgeon: Meredith Pel, MD;  Location: [facility];  Service: Orthopedics;  Laterality: Left;   SHOULDER ARTHROSCOPY WITH OPEN ROTATOR CUFF REPAIR AND DISTAL CLAVICLE ACROMINECTOMY Right 03/14/2022   Procedure: RIGHT SHOULDER ARTHROSCOPY, DEBRIDEMENT, MINI OPEN ROTATOR CUFF TEAR REPAIR;  Surgeon: Meredith Pel, MD;  Location: [facility];  Service: Orthopedics;  Laterality: Right;   SKIN BIOPSY  2019    Current Medications: Current Meds  Medication Sig   acyclovir (ZOVIRAX) 400 MG tablet Take 1 tablet (400 mg total) by mouth 2 (two) times daily. Increase to 2 tabs BID for 5 days if flare.   amphetamine-dextroamphetamine (ADDERALL) 15 MG tablet Take 1 tablet  by mouth daily. With additional 1/2 tab in afternoon if needed.   aspirin EC 81 MG tablet Take 1 tablet (81 mg total) by mouth daily. Swallow whole. (Patient taking differently: Take 81 mg by mouth every evening. Swallow whole.)   citalopram (CELEXA) 40 MG tablet Take 1 tablet (40 mg total) by mouth daily.   diclofenac (VOLTAREN) 75 MG EC tablet Take 1 tablet (75 mg total) by mouth 2 (two) times daily.   gabapentin (NEURONTIN) 300 MG capsule Take 1 capsule (300 mg total) by mouth every morning AND 2 capsules (600 mg total) at bedtime.   methocarbamol (ROBAXIN) 500 MG tablet Take 1 tablet (500 mg total) by mouth every 8 (eight) hours as needed for muscle  spasms.   Naphazoline-Glycerin (CLEAR EYES MAX REDNESS RELIEF) 0.03-0.5 % SOLN Place 1-2 drops into both eyes 3 (three) times daily as needed (dry/irritated eyes.).   rosuvastatin (CRESTOR) 40 MG tablet Take 1 tablet (40 mg total) by mouth daily. (Patient taking differently: Take 40 mg by mouth at bedtime.)   varenicline (CHANTIX CONTINUING MONTH PAK) 1 MG tablet Take 1 tablet (1 mg total) by mouth 2 (two) times daily.   varenicline (CHANTIX PAK) 0.5 MG X 11 & 1 MG X 42 tablet take one 0.'5mg'$  tablet by mouth once daily for 3 days, then increase to one 0.'5mg'$  tablet twice a day for 4 days, then increase to one '1mg'$  tablet twice a day     Allergies:    Hydrocodone and Zoloft [sertraline]   Social History: Social History   Socioeconomic History   Marital status: Single    Spouse name: Not on file   Number of children: 3   Years of education: Not on file   Highest education level: Not on file  Occupational History   Not on file  Tobacco Use   Smoking status: Every Day    Packs/day: 1.00    Years: 50.00    Total pack years: 50.00    Types: Cigarettes   Smokeless tobacco: Never  Vaping Use   Vaping Use: Former  Substance and Sexual Activity   Alcohol use: No   Drug use: No    Frequency: 7.0 times per week    Types: IV    Comment: history of Rx opoid dependency   Sexual activity: Not Currently  Other Topics Concern   Not on file  Social History Narrative   Not on file   Social Determinants of Health   Financial Resource Strain: Not on file  Food Insecurity: Not on file  Transportation Needs: Not on file  Physical Activity: Not on file  Stress: Not on file  Social Connections: Not on file     Family History: The patient's family history includes Alcohol abuse in his maternal grandfather and paternal grandfather; Cancer in his mother; Colon cancer in his father; Colon polyps in his sister and sister; Dementia in his mother; Diabetes in his mother; Heart Problems in his  mother; Heart disease in his mother; Liver cancer in his father. There is no history of Esophageal cancer, Stomach cancer, or Rectal cancer.  ROS:   All other ROS reviewed and negative. Pertinent positives noted in the HPI.     EKGs/Labs/Other Studies Reviewed:   The following studies were personally reviewed by me today:  Recent Labs: 12/03/2021: ALT 25 03/14/2022: BUN 12; Creatinine, Ser 1.09; Hemoglobin 15.3; Platelets 305; Potassium 3.7; Sodium 137   Recent Lipid Panel    Component Value Date/Time   CHOL  160 12/03/2021 0947   TRIG 219.0 (H) 12/03/2021 0947   HDL 35.80 (L) 12/03/2021 0947   CHOLHDL 4 12/03/2021 0947   VLDL 43.8 (H) 12/03/2021 0947   LDLCALC 137 (H) 11/24/2020 0950   LDLDIRECT 96.0 12/03/2021 0947    Physical Exam:   VS:  BP 138/88   Pulse 72   Ht 6' (1.829 m)   Wt 191 lb (86.6 kg)   SpO2 99%   BMI 25.90 kg/m    Wt Readings from Last 3 Encounters:  04/08/22 191 lb (86.6 kg)  03/14/22 190 lb 0.6 oz (86.2 kg)  12/03/21 187 lb 6.4 oz (85 kg)    General: Well nourished, well developed, in no acute distress Head: Atraumatic, normal size  Eyes: PEERLA, EOMI  Neck: Supple, no JVD Endocrine: No thryomegaly Cardiac: Normal S1, S2; RRR; no murmurs, rubs, or gallops Lungs: Clear to auscultation bilaterally, no wheezing, rhonchi or rales  Abd: Soft, nontender, no hepatomegaly  Ext: No edema, pulses 2+ Musculoskeletal: No deformities, BUE and BLE strength normal and equal Skin: Warm and dry, no rashes   Neuro: Alert and oriented to person, place, time, and situation, CNII-XII grossly intact, no focal deficits  Psych: Normal mood and affect   ASSESSMENT:   Louis Zimmerman is a 61 y.o. male who presents for the following: 1. Coronary artery calcification   2. Hyperlipidemia, unspecified hyperlipidemia type   3. Pulmonary emphysema, unspecified emphysema type ([place])   4. Chronic obstructive pulmonary disease, unspecified COPD type ([place])   5. Tobacco abuse      PLAN:   1. Coronary artery calcification 2. Hyperlipidemia, unspecified hyperlipidemia type -Coronary artery calcifications.  On aspirin.  On Crestor.  LDL cholesterol less than 100.  Would like to get this below 70.  He will have repeat labs in November.  He will work on his diet.  Overall no symptoms of angina.  Echo was normal.  No need for stress test at this time.  3. Pulmonary emphysema, unspecified emphysema type ([place]) 4. Chronic obstructive pulmonary disease, unspecified COPD type ([place]) 5. Tobacco abuse -He carries a diagnosis of COPD.  Has not seen pulmonary.  I have referred him again.  He also is smoking 5 to 10 cigarettes/day.  3 minutes of smoking cessation counseling was provided in office today.  It is of paramount importance that he quit smoking.  Disposition: Return in about 1 year (around 04/09/2023).  Medication Adjustments/Labs and Tests Ordered: Current medicines are reviewed at length with the patient today.  Concerns regarding medicines are outlined above.  Orders Placed This Encounter  Procedures   Ambulatory referral to Pulmonology   No orders of the defined types were placed in this encounter.   Patient Instructions  Medication Instructions:  The current medical regimen is effective;  continue present plan and medications.  *If you need a refill on your cardiac medications before your next appointment, please call your pharmacy*   Follow-Up: At [facility], you and your health needs are our priority.  As part of our continuing mission to provide you with exceptional heart care, we have created designated Provider Care Teams.  These Care Teams include your primary Cardiologist (physician) and Advanced Practice Providers (APPs -  Physician Assistants and Nurse Practitioners) who all work together to provide you with the care you need, when you need it.  We recommend signing up for the patient portal called "MyChart".  Sign up information is  provided on this After Visit Summary.  MyChart is used to connect with patients for Virtual Visits (Telemedicine).  Patients are able to view lab/test results, encounter notes, upcoming appointments, etc.  Non-urgent messages can be sent to your provider as well.   To learn more about what you can do with MyChart, go to [email or web address].    Your next appointment:   12 month(s)  The format for your next appointment:   In Person  Provider:   Evalina Field, MD             Time Spent with Patient: I have spent a total of 25 minutes with patient reviewing hospital notes, telemetry, EKGs, labs and examining the patient as well as establishing an assessment and plan that was discussed with the patient.  > 50% of time was spent in direct patient care.  Signed, Addison Naegeli. Audie Box, MD, [place]  [address], [place], [place] [address] [phone]  04/08/2022 8:35 AM

## 2022-04-08 ENCOUNTER — Encounter: Payer: Self-pay | Admitting: Cardiovascular Disease

## 2022-04-08 ENCOUNTER — Ambulatory Visit: Payer: BC Managed Care – PPO | Attending: Cardiovascular Disease | Admitting: Cardiovascular Disease

## 2022-04-08 VITALS — BP 138/88 | HR 72 | Ht 72.0 in | Wt 191.0 lb

## 2022-04-08 DIAGNOSIS — I2510 Atherosclerotic heart disease of native coronary artery without angina pectoris: Secondary | ICD-10-CM | POA: Diagnosis not present

## 2022-04-08 DIAGNOSIS — Z720 Tobacco use: Secondary | ICD-10-CM

## 2022-04-08 DIAGNOSIS — F17210 Nicotine dependence, cigarettes, uncomplicated: Secondary | ICD-10-CM | POA: Diagnosis not present

## 2022-04-08 DIAGNOSIS — I2584 Coronary atherosclerosis due to calcified coronary lesion: Secondary | ICD-10-CM

## 2022-04-08 DIAGNOSIS — E785 Hyperlipidemia, unspecified: Secondary | ICD-10-CM | POA: Diagnosis not present

## 2022-04-08 DIAGNOSIS — J439 Emphysema, unspecified: Secondary | ICD-10-CM

## 2022-04-08 DIAGNOSIS — J449 Chronic obstructive pulmonary disease, unspecified: Secondary | ICD-10-CM

## 2022-04-08 NOTE — Patient Instructions (Signed)
Medication Instructions:  The current medical regimen is effective;  continue present plan and medications.  *If you need a refill on your cardiac medications before your next appointment, please call your pharmacy*  Follow-Up: At [facility], you and your health needs are our priority.  As part of our continuing mission to provide you with exceptional heart care, we have created designated Provider Care Teams.  These Care Teams include your primary Cardiologist (physician) and Advanced Practice Providers (APPs -  Physician Assistants and Nurse Practitioners) who all work together to provide you with the care you need, when you need it.  We recommend signing up for the patient portal called "MyChart".  Sign up information is provided on this After Visit Summary.  MyChart is used to connect with patients for Virtual Visits (Telemedicine).  Patients are able to view lab/test results, encounter notes, upcoming appointments, etc.  Non-urgent messages can be sent to your provider as well.   To learn more about what you can do with MyChart, go to [email or web address].    Your next appointment:   12 month(s)  The format for your next appointment:   In Person  Provider:   Primrose T O'Neal, MD         

## 2022-04-10 ENCOUNTER — Other Ambulatory Visit: Payer: Self-pay | Admitting: Surgical

## 2022-04-11 ENCOUNTER — Ambulatory Visit: Payer: Self-pay

## 2022-04-11 ENCOUNTER — Ambulatory Visit (INDEPENDENT_AMBULATORY_CARE_PROVIDER_SITE_OTHER): Payer: BC Managed Care – PPO | Admitting: Surgical

## 2022-04-11 ENCOUNTER — Encounter: Payer: Self-pay | Admitting: Surgical

## 2022-04-11 DIAGNOSIS — M25511 Pain in right shoulder: Secondary | ICD-10-CM | POA: Diagnosis not present

## 2022-04-11 MED ORDER — BUPIVACAINE HCL 0.5 % IJ SOLN
1.0000 mL | INTRAMUSCULAR | Status: AC | PRN
  Administered 2022-04-11: 1 mL

## 2022-04-11 NOTE — Progress Notes (Signed)
Post-Op Visit Note   Patient: Louis Zimmerman           Date of Birth: 25-Aug-1960           MRN: [id number] Visit Date: 04/11/2022 PCP: [name], MD   Assessment & Plan:  Chief Complaint:  Chief Complaint  Patient presents with   Right Shoulder - Routine Post Op    03/14/22 right shoulder scope; DOA; BT; [facility]   Visit Diagnoses:  1. Trigger point of right shoulder region     Plan: Patient is a 61 year old male who presents s/p right shoulder arthroscopy with biceps tenodesis and mini open rotator cuff tear repair on 03/14/2022.  He has been doing well overall but states that about 2 weeks ago he was working on a toolbox and making efforts not to lift anything heavy but the toolbox fell and he instinctively tried to keep it from falling and reached out with his right arm.  He felt some increased pain in the bicep region for a brief amount of time and has noticed some increased prominence of the bicep muscle since then.  He has no difficulty lifting his arm.  He is out of the sling full-time.  He is sleeping well at night except for scapular pain in both scapula that is keeping him up at night and he has to rub these trigger points wit a cane and use Biofreeze to get any relief.  He has had these trigger points injected in the past with good relief from Toradol injections and would like to repeat these.  He has not returned to work yet.  Work involves lifting generally about 10 to 15 pounds routinely but he does have a certain tool set that he has to lift at times that weighs upwards of 70 to 80 pounds.  He states that he would be able to use his other shoulder and someone else to help him with this tool set.  On exam, patient has 60 degrees X rotation, 90 degrees abduction, 150 degrees forward flexion both passively and actively.  Incisions are healing well without evidence of infection or dehiscence.  He has no significant coarse grinding or crepitus noted with passive motion of  the shoulder.  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  Axillary nerve is intact with deltoid firing.  Excellent rotator cuff strength of supra, infra, subscap.  He has excellent strength of supination and bicep flexion without any significant weakness.  Does have 2 trigger points that are tender in the upper right shoulder blade and the lower left shoulder blade that is a couple centimeters inferior to the medial aspect of his lower trapezius incision from prior left shoulder surgery.  Plan is to continue without sling.  He can have full active range of motion of his right shoulder at this point but cautioned against any heavy lifting or strengthening exercises yet.  He was given some passive range of motion exercises to do in order to optimize his range of motion but he has pretty good range of motion for 4 weeks out from rotator cuff repair.  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  Follow-up in 2 weeks per patient's request to see Dr. [name] and start some rotator cuff strengthening exercises  and determine return to work at that point.  Patient also heavily requested trigger point injections today.  Had a long discussion with [name] today about options such as MRI of the scapula, continuing with conservative treatment that he has been using, versus injections.  After lengthy discussion he is set on trigger point injections.  Had an in-depth discussion on the risks and benefits of the injection procedure mainly focusing on the small but not insignificant risk of pneumothorax.  He understands this risk and would like to proceed regardless.  Using ultrasound guidance, the posterior ribs and the interval between the ribs were identified.  In-plane approach was utilized with extravasation of  fluid noted into the muscle belly at the point of maximal tenderness.  This was significantly superficial to the body of the ribs in the area.  Patient tolerated the procedure well without difficulty.  10 to 15 minutes after procedure he had no chest pain, shortness of breath, difficulty taking deep breath.  He will follow-up with Dr. [name] in 2 weeks.  Follow-Up Instructions: No follow-ups on file.   Orders:  Orders Placed This Encounter  Procedures   US Guided Needle Placement - No Linked Charges   No orders of the defined types were placed in this encounter.   Imaging: No results found.  PMFS History: Patient Active Problem List   Diagnosis Date Noted   Nontraumatic complete tear of right rotator cuff    Degenerative superior labral anterior-to-posterior (SLAP) tear of right shoulder    Coronary artery calcification    Hyperlipidemia    Biceps tendonitis on right    Rotator cuff arthropathy 02/16/2019   FHx: colon cancer 12/18/2018   Attention deficit disorder (ADD) in adult 11/10/2017   Moderate episode of recurrent major depressive disorder ([place]) 08/31/2017   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/31/2017   Tobacco abuse 08/31/2017   History of substance abuse ([place]) 02/23/2017   Depression 02/02/2013   Past Medical History:  Diagnosis Date   ADD (attention deficit disorder)    Anxiety    Arthritis    feet, shoulders   Cancer ([place])    skin cancer   Carpal tunnel syndrome    Chronic dyspnea    COPD (chronic obstructive pulmonary disease) ([name])    Coronary artery calcification    noted on CT in 10/2020   Depression    Hyperlipidemia    Pre-diabetes    Tobacco abuse     Family History  Problem Relation Age of Onset   Cancer Mother    Dementia Mother    Diabetes Mother    Heart Problems Mother    Heart disease Mother    Liver cancer Father    Colon cancer Father    Alcohol abuse Maternal Grandfather    Alcohol abuse Paternal Grandfather    Colon polyps Sister     Colon polyps Sister    Esophageal cancer Neg Hx    Stomach cancer Neg Hx    Rectal cancer Neg Hx     Past Surgical History:  Procedure Laterality Date   BICEPT TENODESIS Right 03/14/2022   Procedure: BICEPS TENODESIS;  Surgeon: Meredith Pel, MD;  Location: [facility];  Service: Orthopedics;  Laterality: Right;   CARPAL TUNNEL RELEASE Right 11/13/2020   Procedure: RIGHT CARPAL TUNNEL RELEASE;  Surgeon: Meredith Pel, MD;  Location: [facility];  Service: Orthopedics;  Laterality: Right;   CHOLECYSTECTOMY     COLONOSCOPY     2000   GALLBLADDER  SURGERY  2003   SHOULDER ARTHROSCOPY Left 02/16/2019   LEFT SHOULDER ARTHROSCOPY LOWER TRAPEZIUS TENDON TRANSFER, BICEPS TENODESIS   SHOULDER ARTHROSCOPY WITH DEBRIDEMENT AND BICEP TENDON REPAIR Left 02/16/2019   Procedure: LEFT SHOULDER ARTHROSCOPY LOWER TRAPEZIUS TENDON TRANSFER, BICEPS TENODESIS;  Surgeon: Meredith Pel, MD;  Location: [facility];  Service: Orthopedics;  Laterality: Left;   SHOULDER ARTHROSCOPY WITH OPEN ROTATOR CUFF REPAIR AND DISTAL CLAVICLE ACROMINECTOMY Right 03/14/2022   Procedure: RIGHT SHOULDER ARTHROSCOPY, DEBRIDEMENT, MINI OPEN ROTATOR CUFF TEAR REPAIR;  Surgeon: Meredith Pel, MD;  Location: [facility];  Service: Orthopedics;  Laterality: Right;   SKIN BIOPSY  2019   Social History   Occupational History   Not on file  Tobacco Use   Smoking status: Every Day    Packs/day: 1.00    Years: 50.00    Total pack years: 50.00    Types: Cigarettes   Smokeless tobacco: Never  Vaping Use   Vaping Use: Former  Substance and Sexual Activity   Alcohol use: No   Drug use: No    Frequency: 7.0 times per week    Types: IV    Comment: history of Rx opoid dependency   Sexual activity: Not Currently

## 2022-04-11 NOTE — Progress Notes (Signed)
   Procedure Note  Patient: Louis Zimmerman             Date of Birth: [birth year]           MRN: [id number]             Visit Date: 04/11/2022  Procedures: Visit Diagnoses:  1. Trigger point of right shoulder region     Trigger Point Inj's  Date/Time: 04/11/2022 12:10 PM  Performed by: Donella Stade, PA-C Authorized by: Donella Stade, PA-C   Consent Given by:  Patient Site marked: the procedure site was marked   Timeout: prior to procedure the correct patient, procedure, and site was verified   Indications:  Muscle spasm and pain Total # of Trigger Points:  2 Location: back   Needle Size:  25 G Medications #1:  1 mL bupivacaine 0.5 % Patient tolerance:  Patient tolerated the procedure well with no immediate complications Comments: See other note from today regarding discussion of risk/benefit.

## 2022-04-24 ENCOUNTER — Other Ambulatory Visit: Payer: Self-pay | Admitting: Family Medicine

## 2022-04-26 ENCOUNTER — Ambulatory Visit (INDEPENDENT_AMBULATORY_CARE_PROVIDER_SITE_OTHER): Payer: BC Managed Care – PPO | Admitting: Orthopedic Surgery

## 2022-04-26 DIAGNOSIS — M5412 Radiculopathy, cervical region: Secondary | ICD-10-CM

## 2022-04-27 ENCOUNTER — Encounter: Payer: Self-pay | Admitting: Orthopedic Surgery

## 2022-04-27 NOTE — Progress Notes (Signed)
Office Visit Note   Patient: Louis Zimmerman           Date of Birth: Jan 14, 1961           MRN: [id number] Visit Date: 04/26/2022 Requested by: [name], MD [address] [place],  [place] [address] PCP: [name], MD  Subjective: Chief Complaint  Patient presents with   Follow-up    HPI: Louis Zimmerman is a 61 y.o. male who presents to the office complaining of bilateral trigger point pain.  He had right-sided trigger point injected by [facility] on 04/11/2022.  That feels much better.  Left side is still painful but it is painful in a radicular manner.  Having some trapezial pain and pain which sort of radiates down the left arm.  He currently is about 6 weeks out from right shoulder rotator cuff repair.  On examination he has excellent strength on the right-hand side with internal/external rotation.  Subscap strength infraspinatus strength 5+ out of 5.  Supraspinatus strength also 5+ out of 5.  Excellent motion passively of 65/100/165.  No coarse grinding or crepitus with internal/external rotation of that right arm at 90 degrees of abduction.  Impression is patient is doing well from his rotator cuff repair.  We talked at length for very long time about work options and how he can manage his shoulder over the next 6 weeks to allow for maturation of the tendon bone interface as well as strengthening of the muscle and a nonaggressive way.  I think he has a clear understanding based on his experience with left shoulder surgery.  He also has significant left-sided radicular symptoms consistent with herniated disc at C5-6 or thereabouts.  Plan MRI cervical spine to evaluate left-sided radiculopathy.  Follow-up after that study.  Okay to return to work Monday no restrictions..                ROS: All systems reviewed are negative as they relate to the chief complaint within the history of present illness.  Patient denies fevers or chills.  Assessment & Plan: Visit Diagnoses:   1. Radiculopathy, cervical region     Plan: See above  Follow-Up Instructions: No follow-ups on file.   Orders:  Orders Placed This Encounter  Procedures   MR Cervical Spine w/o contrast   No orders of the defined types were placed in this encounter.     Procedures: No procedures performed   Clinical Data: No additional findings.  Objective: Vital Signs: There were no vitals taken for this visit.  Physical Exam:  Constitutional: Patient appears well-developed HEENT:  Head: Normocephalic Eyes:EOM are normal Neck: Normal range of motion Cardiovascular: Normal rate Pulmonary/chest: Effort normal Neurologic: Patient is alert Skin: Skin is warm Psychiatric: Patient has normal mood and affect  Ortho Exam: See above  Specialty Comments:  No specialty comments available.  Imaging: No results found.   PMFS History: Patient Active Problem List   Diagnosis Date Noted   Nontraumatic complete tear of right rotator cuff    Degenerative superior labral anterior-to-posterior (SLAP) tear of right shoulder    Coronary artery calcification    Hyperlipidemia    Biceps tendonitis on right    Rotator cuff arthropathy 02/16/2019   FHx: colon cancer 12/18/2018   Attention deficit disorder (ADD) in adult 11/10/2017   Moderate episode of recurrent major depressive disorder ([place]) 08/31/2017   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/31/2017   Tobacco abuse 08/31/2017   History  of substance abuse ([place]) 02/23/2017   Depression 02/02/2013   Past Medical History:  Diagnosis Date   ADD (attention deficit disorder)    Anxiety    Arthritis    feet, shoulders   Cancer ([place])    skin cancer   Carpal tunnel syndrome    Chronic dyspnea    COPD (chronic obstructive pulmonary disease) ([name])    Coronary artery calcification    noted on CT in 10/2020   Depression    Hyperlipidemia    Pre-diabetes    Tobacco abuse     Family History  Problem Relation Age of Onset   Cancer Mother     Dementia Mother    Diabetes Mother    Heart Problems Mother    Heart disease Mother    Liver cancer Father    Colon cancer Father    Alcohol abuse Maternal Grandfather    Alcohol abuse Paternal Grandfather    Colon polyps Sister    Colon polyps Sister    Esophageal cancer Neg Hx    Stomach cancer Neg Hx    Rectal cancer Neg Hx     Past Surgical History:  Procedure Laterality Date   BICEPT TENODESIS Right 03/14/2022   Procedure: BICEPS TENODESIS;  Surgeon: Meredith Pel, MD;  Location: [facility];  Service: Orthopedics;  Laterality: Right;   CARPAL TUNNEL RELEASE Right 11/13/2020   Procedure: RIGHT CARPAL TUNNEL RELEASE;  Surgeon: Meredith Pel, MD;  Location: [facility];  Service: Orthopedics;  Laterality: Right;   CHOLECYSTECTOMY     COLONOSCOPY     2000   GALLBLADDER SURGERY  2003   SHOULDER ARTHROSCOPY Left 02/16/2019   LEFT SHOULDER ARTHROSCOPY LOWER TRAPEZIUS TENDON TRANSFER, BICEPS TENODESIS   SHOULDER ARTHROSCOPY WITH DEBRIDEMENT AND BICEP TENDON REPAIR Left 02/16/2019   Procedure: LEFT SHOULDER ARTHROSCOPY LOWER TRAPEZIUS TENDON TRANSFER, BICEPS TENODESIS;  Surgeon: Meredith Pel, MD;  Location: [place];  Service: Orthopedics;  Laterality: Left;   SHOULDER ARTHROSCOPY WITH OPEN ROTATOR CUFF REPAIR AND DISTAL CLAVICLE ACROMINECTOMY Right 03/14/2022   Procedure: RIGHT SHOULDER ARTHROSCOPY, DEBRIDEMENT, MINI OPEN ROTATOR CUFF TEAR REPAIR;  Surgeon: Meredith Pel, MD;  Location: [facility];  Service: Orthopedics;  Laterality: Right;   SKIN BIOPSY  2019   Social History   Occupational History   Not on file  Tobacco Use   Smoking status: Every Day    Packs/day: 1.00    Years: 50.00    Total pack years: 50.00    Types: Cigarettes   Smokeless tobacco: Never  Vaping Use   Vaping Use: Former  Substance and Sexual Activity   Alcohol use: No   Drug use: No    Frequency: 7.0 times per week    Types: IV    Comment: history of Rx opoid dependency    Sexual activity: Not Currently

## 2022-05-01 ENCOUNTER — Institutional Professional Consult (permissible substitution): Payer: BC Managed Care – PPO | Admitting: Pulmonary Disease

## 2022-05-29 ENCOUNTER — Other Ambulatory Visit: Payer: Self-pay | Admitting: Family Medicine

## 2022-05-29 DIAGNOSIS — F988 Other specified behavioral and emotional disorders with onset usually occurring in childhood and adolescence: Secondary | ICD-10-CM

## 2022-05-30 NOTE — Telephone Encounter (Signed)
Medication discussed in May.  15 mg in the morning, 7.5 mg in the afternoon.  Controlled substance database reviewed last filled 03/22/2022, #45, refill ordered.

## 2022-05-30 NOTE — Telephone Encounter (Signed)
Patient is requesting a refill of the following medications: Requested Prescriptions   Pending Prescriptions Disp Refills   amphetamine-dextroamphetamine (ADDERALL) 15 MG tablet [Pharmacy Med Name: Amphetamine-Dextroamphetamine Oral Tablet 15 MG] 45 tablet 0    Sig: TAKE 1 TABLET BY MOUTH DAILY WITH AN ADDITIONAL HALF A TABLET IN THE AFTERNOON IF NEEDED.    Date of patient request: 05/30/22 Last office visit: 12/03/21 Date of last refill: 03/22/22 Last refill amount: 45 Follow up time period per chart: 6 months

## 2022-06-03 ENCOUNTER — Ambulatory Visit (INDEPENDENT_AMBULATORY_CARE_PROVIDER_SITE_OTHER): Payer: BC Managed Care – PPO | Admitting: Family Medicine

## 2022-06-03 ENCOUNTER — Encounter: Payer: Self-pay | Admitting: Family Medicine

## 2022-06-03 VITALS — BP 120/78 | HR 74 | Temp 98.0°F | Ht 72.0 in | Wt 194.0 lb

## 2022-06-03 DIAGNOSIS — R202 Paresthesia of skin: Secondary | ICD-10-CM

## 2022-06-03 DIAGNOSIS — F17210 Nicotine dependence, cigarettes, uncomplicated: Secondary | ICD-10-CM | POA: Diagnosis not present

## 2022-06-03 DIAGNOSIS — E785 Hyperlipidemia, unspecified: Secondary | ICD-10-CM | POA: Diagnosis not present

## 2022-06-03 DIAGNOSIS — I700 Atherosclerosis of aorta: Secondary | ICD-10-CM | POA: Diagnosis not present

## 2022-06-03 DIAGNOSIS — F331 Major depressive disorder, recurrent, moderate: Secondary | ICD-10-CM

## 2022-06-03 DIAGNOSIS — F988 Other specified behavioral and emotional disorders with onset usually occurring in childhood and adolescence: Secondary | ICD-10-CM

## 2022-06-03 LAB — COMPREHENSIVE METABOLIC PANEL
ALT: 25 U/L (ref 0–53)
AST: 18 U/L (ref 0–37)
Albumin: 4.1 g/dL (ref 3.5–5.2)
Alkaline Phosphatase: 86 U/L (ref 39–117)
BUN: 13 mg/dL (ref 6–23)
CO2: 28 mEq/L (ref 19–32)
Calcium: 9.7 mg/dL (ref 8.4–10.5)
Chloride: 104 mEq/L (ref 96–112)
Creatinine, Ser: 1.17 mg/dL (ref 0.40–1.50)
GFR: 67.73 mL/min (ref >60.00)
Glucose, Bld: 77 mg/dL (ref 70–99)
Potassium: 4.4 mEq/L (ref 3.5–5.1)
Sodium: 139 mEq/L (ref 135–145)
Total Bilirubin: 0.3 mg/dL (ref 0.2–1.2)
Total Protein: 6.4 g/dL (ref 6.0–8.3)

## 2022-06-03 LAB — LIPID PANEL
Cholesterol: 257 mg/dL — ABNORMAL HIGH (ref 0–200)
HDL: 39.5 mg/dL (ref >39.00)
NonHDL: 217.68
Total CHOL/HDL Ratio: 7
Triglycerides: 280 mg/dL — ABNORMAL HIGH (ref 0.0–149.0)
VLDL: 56 mg/dL — ABNORMAL HIGH (ref 0.0–40.0)

## 2022-06-03 LAB — LDL CHOLESTEROL, DIRECT: Direct LDL: 180 mg/dL

## 2022-06-03 MED ORDER — VARENICLINE TARTRATE (STARTER) 0.5 MG X 11 & 1 MG X 42 PO TBPK: ORAL_TABLET | ORAL | 0 refills | Status: DC

## 2022-06-03 MED ORDER — GABAPENTIN 300 MG PO CAPS: ORAL_CAPSULE | ORAL | 1 refills | Status: DC

## 2022-06-03 MED ORDER — VARENICLINE TARTRATE 1 MG PO TABS: 1.0000 mg | ORAL_TABLET | Freq: Two times a day (BID) | ORAL | 2 refills | Status: DC

## 2022-06-03 MED ORDER — CITALOPRAM HYDROBROMIDE 40 MG PO TABS: 40.0000 mg | ORAL_TABLET | Freq: Every day | ORAL | 1 refills | Status: DC

## 2022-06-03 NOTE — Patient Instructions (Addendum)
Call and schedule appointment with pulmonary as planned.  No change in medications for now.  Chantix starter pack prescribed, then changed to continuing month pack.  Follow-up with me if you require medication beyond these 2 prescriptions.  See information below on helping to quit smoking.  If any concerns on labs I will let you know.  Take care.   Managing the Challenge of Quitting Smoking Quitting smoking is a physical and mental challenge. You may have cravings, withdrawal symptoms, and temptation to smoke. Before quitting, work with your health care provider to make a plan that can help you manage quitting. Making a plan before you quit may keep you from smoking when you have the urge to smoke while trying to quit. How to manage lifestyle changes Managing stress Stress can make you want to smoke, and wanting to smoke may cause stress. It is important to find ways to manage your stress. You could try some of the following: Practice relaxation techniques. Breathe slowly and deeply, in through your nose and out through your mouth. Listen to music. Soak in a bath or take a shower. Imagine a peaceful place or vacation. Get some support. Talk with family or friends about your stress. Join a support group. Talk with a counselor or therapist. Get some physical activity. Go for a walk, run, or bike ride. Play a favorite sport. Practice yoga.  Medicines Talk with your health care provider about medicines that might help you deal with cravings and make quitting easier for you. Relationships Social situations can be difficult when you are quitting smoking. To manage this, you can: Avoid parties and other social situations where people might be smoking. Avoid alcohol. Leave right away if you have the urge to smoke. Explain to your family and friends that you are quitting smoking. Ask for support and let them know you might be a bit grumpy. Plan activities where smoking is not an option. General  instructions Be aware that many people gain weight after they quit smoking. However, not everyone does. To keep from gaining weight, have a plan in place before you quit, and stick to the plan after you quit. Your plan should include: Eating healthy snacks. When you have a craving, it may help to: Eat popcorn, or try carrots, celery, or other cut vegetables. Chew sugar-free gum. Changing how you eat. Eat small portion sizes at meals. Eat 4-6 small meals throughout the day instead of 1-2 large meals a day. Be mindful when you eat. You should avoid watching television or doing other things that might distract you as you eat. Exercising regularly. Make time to exercise each day. If you do not have time for a long workout, do short bouts of exercise for 5-10 minutes several times a day. Do some form of strengthening exercise, such as weight lifting. Do some exercise that gets your heart beating and causes you to breathe deeply, such as walking fast, running, swimming, or biking. This is very important. Drinking plenty of water or other low-calorie or no-calorie drinks. Drink enough fluid to keep your urine pale yellow.  How to recognize withdrawal symptoms Your body and mind may experience discomfort as you try to get used to not having nicotine in your system. These effects are called withdrawal symptoms. They may include: Feeling hungrier than normal. Having trouble concentrating. Feeling irritable or restless. Having trouble sleeping. Feeling depressed. Craving a cigarette. These symptoms may surprise you, but they are normal to have when quitting smoking. To manage withdrawal  symptoms: Avoid places, people, and activities that trigger your cravings. Remember why you want to quit. Get plenty of sleep. Avoid coffee and other drinks that contain caffeine. These may worsen some of your symptoms. How to manage cravings Come up with a plan for how to deal with your cravings. The plan should  include the following: A definition of the specific situation you want to deal with. An activity or action you will take to replace smoking. A clear idea for how this action will help. The name of someone who could help you with this. Cravings usually last for 5-10 minutes. Consider taking the following actions to help you with your plan to deal with cravings: Keep your mouth busy. Chew sugar-free gum. Suck on hard candies or a straw. Brush your teeth. Keep your hands and body busy. Change to a different activity right away. Squeeze or play with a ball. Do an activity or a hobby, such as making bead jewelry, practicing needlepoint, or working with wood. Mix up your normal routine. Take a short exercise break. Go for a quick walk, or run up and down stairs. Focus on doing something kind or helpful for someone else. Call a friend or family member to talk during a craving. Join a support group. Contact a quitline. Where to find support To get help or find a support group: Call the [facility]'s Smoking Quitline: 1-800-QUIT-NOW 858-510-9373) Text QUIT to SmokefreeTXT: 517616 Where to find more information Visit these websites to find more information on quitting smoking: U.S. Department of Health and Human Services: www.smokefree.gov American Lung Association: www.freedomfromsmoking.org Centers for Disease Control and Prevention (CDC): http://www.wolf.info/ American Heart Association: www.heart.org Contact a health care provider if: You want to change your plan for quitting. The medicines you are taking are not helping. Your eating feels out of control or you cannot sleep. You feel depressed or become very anxious. Summary Quitting smoking is a physical and mental challenge. You will face cravings, withdrawal symptoms, and temptation to smoke again. Preparation can help you as you go through these challenges. Try different techniques to manage stress, handle social situations, and  prevent weight gain. You can deal with cravings by keeping your mouth busy (such as by chewing gum), keeping your hands and body busy, calling family or friends, or contacting a quitline for people who want to quit smoking. You can deal with withdrawal symptoms by avoiding places where people smoke, getting plenty of rest, and avoiding drinks that contain caffeine. This information is not intended to replace advice given to you by your health care provider. Make sure you discuss any questions you have with your health care provider. Document Revised: 07/06/2021 Document Reviewed: 07/06/2021 Elsevier Patient Education  [place].

## 2022-06-03 NOTE — Progress Notes (Unsigned)
Subjective:  Patient ID: Louis Zimmerman, male    DOB: January 22, 1961  Age: 61 y.o. MRN: [id number]  CC:  Chief Complaint  Patient presents with   Hyperlipidemia    Pt states all is well   ADHD    HPI Louis Zimmerman presents for   Hyperlipidemia: Crestor 40 mg daily. History of coronary artery calcification, cardiology Dr. Audie Box, appointment September 11.  Continued aspirin, goal of LDL below 70.  Monster energy drink and crackers.  He was referred to pulmonary with history of COPD and continued smoking with smoking cessation discussed with cardiology.  5 to 10 cigarettes/day.  Has taken Chantix. No show for Dr. Elsworth Soho on 10/4 - has not rescheduled.  Off chantix - needs refill. Out for past 6 weeks. Smoking 1 ppd now. Would like to restart chantix.   Foot dysesthesias treated with gabapentin, working well - rare flare 1-2x/month.  Chronic shoulder pain followed by Ortho. Had RTC surgery 8/17. Improving - back to work 6 weeks   Lab Results  Component Value Date   CHOL 160 12/03/2021   HDL 35.80 (L) 12/03/2021   LDLCALC 137 (H) 11/24/2020   LDLDIRECT 96.0 12/03/2021   TRIG 219.0 (H) 12/03/2021   CHOLHDL 4 12/03/2021   Lab Results  Component Value Date   ALT 25 12/03/2021   AST 18 12/03/2021   ALKPHOS 95 12/03/2021   BILITOT 0.5 12/03/2021   Depression/anxiety/ADD Has been taking Celexa for depression, anxiety, feels like this is working well.  Adderall 15 mg in the morning, half dosage in the afternoon has been the effective dose.  Half dose in the morning has not worked previously.  Denies chest pain, palpitations, insomnia or appetite changes.  Controlled substance database reviewed last filled #45 on 03/22/2022. Recent rx on 11/2 not yet picked up.  Wt Readings from Last 3 Encounters:  06/03/22 194 lb (88 kg)  04/08/22 191 lb (86.6 kg)  03/14/22 190 lb 0.6 oz (86.2 kg)     History Patient Active Problem List   Diagnosis Date Noted   Nontraumatic complete tear of  right rotator cuff    Degenerative superior labral anterior-to-posterior (SLAP) tear of right shoulder    Coronary artery calcification    Hyperlipidemia    Biceps tendonitis on right    Rotator cuff arthropathy 02/16/2019   FHx: colon cancer 12/18/2018   Attention deficit disorder (ADD) in adult 11/10/2017   Moderate episode of recurrent major depressive disorder ([place]) 08/31/2017   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/31/2017   Tobacco abuse 08/31/2017   History of substance abuse ([place]) 02/23/2017   Depression 02/02/2013   Past Medical History:  Diagnosis Date   ADD (attention deficit disorder)    Anxiety    Arthritis    feet, shoulders   Cancer ([place])    skin cancer   Carpal tunnel syndrome    Chronic dyspnea    COPD (chronic obstructive pulmonary disease) ([place])    Coronary artery calcification    noted on CT in 10/2020   Depression    Hyperlipidemia    Pre-diabetes    Tobacco abuse    Past Surgical History:  Procedure Laterality Date   BICEPT TENODESIS Right 03/14/2022   Procedure: BICEPS TENODESIS;  Surgeon: Meredith Pel, MD;  Location: [facility];  Service: Orthopedics;  Laterality: Right;   CARPAL TUNNEL RELEASE Right 11/13/2020   Procedure: RIGHT CARPAL TUNNEL RELEASE;  Surgeon: Meredith Pel, MD;  Location: [facility];  Service:  Orthopedics;  Laterality: Right;   CHOLECYSTECTOMY     COLONOSCOPY     2000   GALLBLADDER SURGERY  2003   SHOULDER ARTHROSCOPY Left 02/16/2019   LEFT SHOULDER ARTHROSCOPY LOWER TRAPEZIUS TENDON TRANSFER, BICEPS TENODESIS   SHOULDER ARTHROSCOPY WITH DEBRIDEMENT AND BICEP TENDON REPAIR Left 02/16/2019   Procedure: LEFT SHOULDER ARTHROSCOPY LOWER TRAPEZIUS TENDON TRANSFER, BICEPS TENODESIS;  Surgeon: Meredith Pel, MD;  Location: [facility];  Service: Orthopedics;  Laterality: Left;   SHOULDER ARTHROSCOPY WITH OPEN ROTATOR CUFF REPAIR AND DISTAL CLAVICLE ACROMINECTOMY Right 03/14/2022   Procedure: RIGHT SHOULDER ARTHROSCOPY,  DEBRIDEMENT, MINI OPEN ROTATOR CUFF TEAR REPAIR;  Surgeon: Meredith Pel, MD;  Location: [facility];  Service: Orthopedics;  Laterality: Right;   SKIN BIOPSY  2019   Allergies  Allergen Reactions   Hydrocodone Anaphylaxis   Zoloft [Sertraline] Other (See Comments)    sedating   Prior to Admission medications   Medication Sig Start Date End Date Taking? Authorizing Provider  acyclovir (ZOVIRAX) 400 MG tablet TAKE ONE TABLET BY MOUTH TWICE DAILY, increase to 2 tablets twice a day for 5 days if flare 04/24/22  Yes [name], MD  amphetamine-dextroamphetamine (ADDERALL) 15 MG tablet TAKE 1 TABLET BY MOUTH DAILY WITH AN ADDITIONAL HALF A TABLET IN THE AFTERNOON IF NEEDED. 05/30/22  Yes [name], MD  aspirin EC 81 MG tablet Take 1 tablet (81 mg total) by mouth daily. Swallow whole. Patient taking differently: Take 81 mg by mouth every evening. Swallow whole. 05/28/21  Yes O'Neal, Cassie Freer, MD  citalopram (CELEXA) 40 MG tablet Take 1 tablet (40 mg total) by mouth daily. 12/03/21  Yes [name], MD  diclofenac (VOLTAREN) 75 MG EC tablet TAKE 1 TABLET BY MOUTH TWICE A DAY 04/10/22  Yes Magnant, Charles L, PA-C  gabapentin (NEURONTIN) 300 MG capsule Take 1 capsule (300 mg total) by mouth every morning AND 2 capsules (600 mg total) at bedtime. 12/03/21  Yes [name], MD  methocarbamol (ROBAXIN) 500 MG tablet Take 1 tablet (500 mg total) by mouth every 8 (eight) hours as needed for muscle spasms. 03/14/22  Yes Magnant, Charles L, PA-C  rosuvastatin (CRESTOR) 40 MG tablet Take 1 tablet (40 mg total) by mouth daily. Patient taking differently: Take 40 mg by mouth at bedtime. 10/05/21 03/12/24 Yes Sande Rives E, PA-C  varenicline (CHANTIX CONTINUING MONTH PAK) 1 MG tablet Take 1 tablet (1 mg total) by mouth 2 (two) times daily. 09/03/21  Yes [name], MD  Naphazoline-Glycerin (CLEAR EYES MAX REDNESS RELIEF) 0.03-0.5 % SOLN Place 1-2 drops into both eyes 3 (three)  times daily as needed (dry/irritated eyes.). Patient not taking: Reported on 06/03/2022    [provider]  varenicline (CHANTIX PAK) 0.5 MG X 11 & 1 MG X 42 tablet take one 0.'5mg'$  tablet by mouth once daily for 3 days, then increase to one 0.'5mg'$  tablet twice a day for 4 days, then increase to one '1mg'$  tablet twice a day Patient not taking: Reported on 06/03/2022 02/04/22   [name], MD   Social History   Socioeconomic History   Marital status: Single    Spouse name: Not on file   Number of children: 3   Years of education: Not on file   Highest education level: Not on file  Occupational History   Not on file  Tobacco Use   Smoking status: Every Day    Packs/day: 1.00    Years: 50.00  Total pack years: 50.00    Types: Cigarettes   Smokeless tobacco: Never  Vaping Use   Vaping Use: Former  Substance and Sexual Activity   Alcohol use: No   Drug use: No    Frequency: 7.0 times per week    Types: IV    Comment: history of Rx opoid dependency   Sexual activity: Not Currently  Other Topics Concern   Not on file  Social History Narrative   Not on file   Social Determinants of Health   Financial Resource Strain: Not on file  Food Insecurity: Not on file  Transportation Needs: Not on file  Physical Activity: Not on file  Stress: Not on file  Social Connections: Not on file  Intimate Partner Violence: Not on file    Review of Systems  Constitutional:  Negative for fatigue and unexpected weight change.  Eyes:  Negative for visual disturbance.  Respiratory:  Negative for cough, chest tightness and shortness of breath.   Cardiovascular:  Negative for chest pain, palpitations and leg swelling.  Gastrointestinal:  Negative for abdominal pain and blood in stool.  Neurological:  Negative for dizziness, light-headedness and headaches.     Objective:   Vitals:   06/03/22 0844  BP: 120/78  Pulse: 74  Temp: 98 F (36.7 C)  SpO2: 98%  Weight: 194 lb (88 kg)   Height: 6' (1.829 m)     Physical Exam Vitals reviewed.  Constitutional:      Appearance: He is well-developed.  HENT:     Head: Normocephalic and atraumatic.  Neck:     Vascular: No carotid bruit or JVD.  Cardiovascular:     Rate and Rhythm: Normal rate and regular rhythm.     Heart sounds: Normal heart sounds. No murmur heard. Pulmonary:     Effort: Pulmonary effort is normal.     Breath sounds: Normal breath sounds. No rales.  Musculoskeletal:     Right lower leg: No edema.     Left lower leg: No edema.  Skin:    General: Skin is warm and dry.  Neurological:     Mental Status: He is alert and oriented to person, place, and time.  Psychiatric:        Mood and Affect: Mood normal.        Assessment & Plan:  Louis Zimmerman is a 61 y.o. male . Atherosclerosis of aorta ([name]) Hyperlipidemia, unspecified hyperlipidemia type - Plan: Comprehensive metabolic panel, Lipid panel  -Stable with tolerating Crestor.  Continue same.  Check updated labs.  Follow-up recommended with pulmonary as cardiology initiated.  Moderate episode of recurrent major depressive disorder ([place]) - Plan: citalopram (CELEXA) 40 MG tablet  -Stable with current dose Celexa, continue same  Cigarette nicotine dependence without complication - Plan: varenicline (CHANTIX CONTINUING MONTH PAK) 1 MG tablet, Varenicline Tartrate, Starter, (CHANTIX STARTING MONTH PAK) 0.5 MG X 11 & 1 MG X 42 TBPK  -Cessation again discussed, has been better with Chantix but discussed this is not a permanent or long-term medication.  Can try to restart with starting dose, continuing month pack and follow-up during that time to check status.  Other resources can be provided if needed.  Paresthesia of foot, bilateral - Plan: gabapentin (NEURONTIN) 300 MG capsule  -Stable with gabapentin, continue same  Attention deficit disorder (ADD) in adult - Plan: Comprehensive metabolic panel, Lipid panel  -Stable control, continue same  dose Adderall.  Recently refilled.  45-monthfollow-up, with follow-up in the next few  months for smoking cessation.   Meds ordered this encounter  Medications   citalopram (CELEXA) 40 MG tablet    Sig: Take 1 tablet (40 mg total) by mouth daily.    Dispense:  90 tablet    Refill:  1   varenicline (CHANTIX CONTINUING MONTH PAK) 1 MG tablet    Sig: Take 1 tablet (1 mg total) by mouth 2 (two) times daily.    Dispense:  60 tablet    Refill:  2   Varenicline Tartrate, Starter, (CHANTIX STARTING MONTH PAK) 0.5 MG X 11 & 1 MG X 42 TBPK    Sig: Take one 0.5 mg tablet by mouth once daily for 3 days, then increase to one 0.5 mg tablet twice daily for 4 days, then increase to one 1 mg tablet twice daily.    Dispense:  53 each    Refill:  0   gabapentin (NEURONTIN) 300 MG capsule    Sig: Take 1 capsule (300 mg total) by mouth every morning AND 2 capsules (600 mg total) at bedtime.    Dispense:  270 capsule    Refill:  1   Patient Instructions  Call and schedule appointment with pulmonary as planned.  No change in medications for now.  Chantix starter pack prescribed, then changed to continuing month pack.  Follow-up with me if you require medication beyond these 2 prescriptions.  See information below on helping to quit smoking.  If any concerns on labs I will let you know.  Take care.     Signed,   Merri Ray, MD [facility] 06/03/22 9:30 AM

## 2022-06-22 ENCOUNTER — Other Ambulatory Visit: Payer: Self-pay | Admitting: Family Medicine

## 2022-07-17 ENCOUNTER — Other Ambulatory Visit: Payer: Self-pay | Admitting: Family Medicine

## 2022-07-17 DIAGNOSIS — F988 Other specified behavioral and emotional disorders with onset usually occurring in childhood and adolescence: Secondary | ICD-10-CM

## 2022-07-18 MED ORDER — AMPHETAMINE-DEXTROAMPHETAMINE 15 MG PO TABS: ORAL_TABLET | ORAL | 0 refills | Status: DC

## 2022-07-18 NOTE — Telephone Encounter (Signed)
Adderall 15 mg LOV: 06/03/22 Last Refill:05/30/22 Upcoming appt: 12/04/22

## 2022-07-18 NOTE — Telephone Encounter (Signed)
Controlled substance database (PDMP) reviewed. No concerns appreciated. #45 on 05/30/22. OV to discuss meds 06/03/22. Refilled.

## 2022-08-10 ENCOUNTER — Other Ambulatory Visit: Payer: Self-pay | Admitting: Family Medicine

## 2022-08-12 MED ORDER — ACYCLOVIR 400 MG PO TABS: 400.0000 mg | ORAL_TABLET | Freq: Two times a day (BID) | ORAL | 0 refills | Status: DC

## 2022-09-10 ENCOUNTER — Other Ambulatory Visit: Payer: Self-pay | Admitting: Family Medicine

## 2022-09-10 DIAGNOSIS — F988 Other specified behavioral and emotional disorders with onset usually occurring in childhood and adolescence: Secondary | ICD-10-CM

## 2022-09-10 MED ORDER — AMPHETAMINE-DEXTROAMPHETAMINE 15 MG PO TABS: ORAL_TABLET | ORAL | 0 refills | Status: DC

## 2022-09-10 NOTE — Telephone Encounter (Signed)
Medication discussed November 6.  Controlled substance database reviewed.  Last filled 07/19/2022, refill ordered.

## 2022-09-10 NOTE — Telephone Encounter (Signed)
Adderall 15 mg LOV: 06/03/22 Last Refill:07/18/22 Upcoming appt: 12/04/22

## 2022-09-12 DIAGNOSIS — L821 Other seborrheic keratosis: Secondary | ICD-10-CM | POA: Diagnosis not present

## 2022-09-12 DIAGNOSIS — D1801 Hemangioma of skin and subcutaneous tissue: Secondary | ICD-10-CM | POA: Diagnosis not present

## 2022-09-12 DIAGNOSIS — L814 Other melanin hyperpigmentation: Secondary | ICD-10-CM | POA: Diagnosis not present

## 2022-09-12 DIAGNOSIS — L309 Dermatitis, unspecified: Secondary | ICD-10-CM | POA: Diagnosis not present

## 2022-09-18 DIAGNOSIS — R208 Other disturbances of skin sensation: Secondary | ICD-10-CM | POA: Diagnosis not present

## 2022-09-18 DIAGNOSIS — L989 Disorder of the skin and subcutaneous tissue, unspecified: Secondary | ICD-10-CM | POA: Diagnosis not present

## 2022-09-18 DIAGNOSIS — L538 Other specified erythematous conditions: Secondary | ICD-10-CM | POA: Diagnosis not present

## 2022-09-18 DIAGNOSIS — L7211 Pilar cyst: Secondary | ICD-10-CM | POA: Diagnosis not present

## 2022-11-04 ENCOUNTER — Other Ambulatory Visit: Payer: Self-pay | Admitting: Family Medicine

## 2022-11-04 DIAGNOSIS — F988 Other specified behavioral and emotional disorders with onset usually occurring in childhood and adolescence: Secondary | ICD-10-CM

## 2022-11-05 MED ORDER — AMPHETAMINE-DEXTROAMPHETAMINE 15 MG PO TABS: ORAL_TABLET | ORAL | 0 refills | Status: DC

## 2022-11-05 NOTE — Telephone Encounter (Signed)
Attention-deficit disorder discussed 06/03/2022.  Stable control, 30-month follow-up plan.  Controlled substance database reviewed.  #45 last filled on 09/11/2022, previously 07/19/2022.  Refill ordered.

## 2022-11-05 NOTE — Telephone Encounter (Signed)
Adderall 15 mg LOV: 06/03/22 Last Refill:09/10/22 Upcoming appt: 12/04/22

## 2022-11-07 ENCOUNTER — Other Ambulatory Visit: Payer: Self-pay | Admitting: Family Medicine

## 2022-11-07 DIAGNOSIS — F988 Other specified behavioral and emotional disorders with onset usually occurring in childhood and adolescence: Secondary | ICD-10-CM

## 2022-11-08 MED ORDER — AMPHETAMINE-DEXTROAMPHETAMINE 15 MG PO TABS: ORAL_TABLET | ORAL | 0 refills | Status: DC

## 2022-11-08 NOTE — Telephone Encounter (Signed)
Controlled substance database reviewed.  Dextroamphetamine-amphetamine No. 45 last filled 09/11/2022, previously 07/19/2022.  Medication discussed in November, refill ordered.

## 2022-11-08 NOTE — Telephone Encounter (Signed)
Adderall 15 mg LOV: 06/03/22 Last Refill:11/05/22 Upcoming appt: 12/04/22

## 2022-11-15 DIAGNOSIS — N5201 Erectile dysfunction due to arterial insufficiency: Secondary | ICD-10-CM | POA: Diagnosis not present

## 2022-12-04 ENCOUNTER — Encounter: Payer: BC Managed Care – PPO | Admitting: Family Medicine

## 2022-12-08 ENCOUNTER — Other Ambulatory Visit: Payer: Self-pay | Admitting: Family Medicine

## 2022-12-08 DIAGNOSIS — F988 Other specified behavioral and emotional disorders with onset usually occurring in childhood and adolescence: Secondary | ICD-10-CM

## 2022-12-09 NOTE — Telephone Encounter (Signed)
Adderall 15 mg LOV: 06/03/22 Last Refill:11/08/22 Upcoming appt: 12/11/22

## 2022-12-10 MED ORDER — ACYCLOVIR 400 MG PO TABS: 400.0000 mg | ORAL_TABLET | Freq: Two times a day (BID) | ORAL | 0 refills | Status: DC

## 2022-12-10 NOTE — Telephone Encounter (Signed)
Left pt a VM stating the detailed message from Dr Neva Seat

## 2022-12-10 NOTE — Telephone Encounter (Signed)
Controlled substance database reviewed.  Last filled #45 on 11/21/2022.  Previously 09/11/2022, 07/19/2022.  Appears to be early refill, please clarify if needed at this time. Acyclovir ordered, adderall refused at this time.

## 2022-12-11 ENCOUNTER — Encounter: Payer: BC Managed Care – PPO | Admitting: Family Medicine

## 2022-12-27 ENCOUNTER — Encounter: Payer: BC Managed Care – PPO | Admitting: Family Medicine

## 2022-12-28 ENCOUNTER — Other Ambulatory Visit: Payer: Self-pay | Admitting: Family Medicine

## 2022-12-28 DIAGNOSIS — F988 Other specified behavioral and emotional disorders with onset usually occurring in childhood and adolescence: Secondary | ICD-10-CM

## 2022-12-30 MED ORDER — AMPHETAMINE-DEXTROAMPHETAMINE 15 MG PO TABS: ORAL_TABLET | ORAL | 0 refills | Status: DC

## 2022-12-30 MED ORDER — ACYCLOVIR 400 MG PO TABS: 400.0000 mg | ORAL_TABLET | Freq: Two times a day (BID) | ORAL | 0 refills | Status: DC

## 2022-12-30 NOTE — Telephone Encounter (Signed)
Controlled substance database reviewed.  Last filled 11/21/2022 for #45.  Discussed in November, 16-month follow-up planned.  I do see appointment scheduled June 13, refill granted.

## 2022-12-30 NOTE — Telephone Encounter (Signed)
Adderall 15 mg LOV: 06/03/22 Last Refill:11/08/22 Upcoming appt: 01/09/23

## 2023-01-09 ENCOUNTER — Encounter: Payer: Self-pay | Admitting: Family Medicine

## 2023-01-09 ENCOUNTER — Ambulatory Visit (INDEPENDENT_AMBULATORY_CARE_PROVIDER_SITE_OTHER): Payer: BC Managed Care – PPO | Admitting: Family Medicine

## 2023-01-09 VITALS — BP 124/76 | HR 77 | Temp 98.1°F | Ht 70.0 in | Wt 187.0 lb

## 2023-01-09 DIAGNOSIS — Z125 Encounter for screening for malignant neoplasm of prostate: Secondary | ICD-10-CM

## 2023-01-09 DIAGNOSIS — Z122 Encounter for screening for malignant neoplasm of respiratory organs: Secondary | ICD-10-CM

## 2023-01-09 DIAGNOSIS — F17210 Nicotine dependence, cigarettes, uncomplicated: Secondary | ICD-10-CM

## 2023-01-09 DIAGNOSIS — Z0000 Encounter for general adult medical examination without abnormal findings: Secondary | ICD-10-CM | POA: Diagnosis not present

## 2023-01-09 DIAGNOSIS — Z131 Encounter for screening for diabetes mellitus: Secondary | ICD-10-CM

## 2023-01-09 DIAGNOSIS — N529 Male erectile dysfunction, unspecified: Secondary | ICD-10-CM

## 2023-01-09 DIAGNOSIS — R5383 Other fatigue: Secondary | ICD-10-CM | POA: Diagnosis not present

## 2023-01-09 DIAGNOSIS — Z1211 Encounter for screening for malignant neoplasm of colon: Secondary | ICD-10-CM

## 2023-01-09 DIAGNOSIS — F439 Reaction to severe stress, unspecified: Secondary | ICD-10-CM

## 2023-01-09 DIAGNOSIS — E785 Hyperlipidemia, unspecified: Secondary | ICD-10-CM | POA: Diagnosis not present

## 2023-01-09 MED ORDER — VARENICLINE TARTRATE (STARTER) 0.5 MG X 11 & 1 MG X 42 PO TBPK: ORAL_TABLET | ORAL | 0 refills | Status: DC

## 2023-01-09 MED ORDER — ROSUVASTATIN CALCIUM 40 MG PO TABS
40.0000 mg | ORAL_TABLET | Freq: Every day | ORAL | 1 refills | Status: DC
Start: 2023-01-09 — End: 2023-08-20

## 2023-01-09 NOTE — Progress Notes (Signed)
Subjective:  Patient ID: Louis Zimmerman, male    DOB: 1961-05-20  Age: 62 y.o. MRN: [id number]  CC:  Chief Complaint  Patient presents with   Annual Exam    Would like testosterone check with labs     HPI Louis Zimmerman presents for Annual Exam   Hyperlipidemia: With history of coronary artery calcification, cardiologist Dr. Flora Lipps.  Treated with aspirin, Crestor 40 mg daily.  Goal LDL below 70. Out of meds for 3 weeks. Needs RF. No side effects on meds.  Lab Results  Component Value Date   CHOL 257 (H) 06/03/2022   HDL 39.50 06/03/2022   LDLCALC 137 (H) 11/24/2020   LDLDIRECT 180.0 06/03/2022   TRIG 280.0 (H) 06/03/2022   CHOLHDL 7 06/03/2022   Lab Results  Component Value Date   ALT 25 06/03/2022   AST 18 06/03/2022   ALKPHOS 86 06/03/2022   BILITOT 0.3 06/03/2022   COPD Seen by pulmonary previously, no-show for follow-up appointment on 10/4.  History of tobacco use, prior Chantix trial, then off at his November visit, 1 pack/day at that time, restarted Chantix. No dyspnea or cough. No inhalers.  Took chantix for few months - decreased amount to 3-4 per day, but unable to quit. Now back to pack per day. Would like to try again.  Work stressors prompt smoking.   Foot paresthesias Treated with gabapentin, 300 mg every morning, 600 mg every afternoon. Working well.  Prior shoulder issues treated by Ortho. Doing well now.   Depression/anxiety/ADD Celexa 40 mg daily for depression, anxiety, Adderall 15 mg in the morning, half dose in the afternoon has been effective previously.  Ineffective with half dose in the morning Adderall.  Denies insomnia, appetite changes, heart palpitations, chest pain or new side effects with his medications.  Controlled substance database reviewed.  Dextroamphetamine-amphetamine 15 mg #45 on 12/30/2022 Work stress is main issue, pressure. Trigger for smoking. Otherwise depression/anxiety is doing ok.      01/09/2023    1:33 PM 11/24/2020     8:27 AM  GAD 7 : Generalized Anxiety Score  Nervous, Anxious, on Edge 0 1  Control/stop worrying 0 1  Worry too much - different things 0 2  Trouble relaxing 0 0  Restless 0 0  Easily annoyed or irritable 0 1  Afraid - awful might happen 0 0  Total GAD 7 Score 0 5         01/09/2023    1:33 PM 06/03/2022    8:43 AM 12/03/2021    9:10 AM 09/03/2021   10:15 AM 06/01/2021   10:30 AM  Depression screen PHQ 2/9  Decreased Interest 0 0 0 0 0  Down, Depressed, Hopeless 0 0 0 0 0  PHQ - 2 Score 0 0 0 0 0  Altered sleeping 0 0 0 0   Tired, decreased energy 0 2 0 0   Change in appetite 0 0 0 0   Feeling bad or failure about yourself  0 0 0 0   Trouble concentrating 0 0 1 1   Moving slowly or fidgety/restless 0 0 0 1   Suicidal thoughts 0 0 0 0   PHQ-9 Score 0 2 1 2      Health Maintenance  Topic Date Due   Zoster Vaccines- Shingrix (1 of 2) Never done   Lung Cancer Screening  11/06/2021   Colonoscopy  06/04/2023 (Originally 07/29/2013)   INFLUENZA VACCINE  02/27/2023   DTaP/Tdap/Td (2 - Td or  Tdap) 06/07/2028   Hepatitis C Screening  Completed   HIV Screening  Completed   HPV VACCINES  Aged Out   COVID-19 Vaccine  Discontinued  Overdue for colonoscopy. Scheduled in past 2 yrs - had covid. Agrees to referral.  Prostate: does have family history of prostate cancer - father at age 66.  The natural history of prostate cancer and ongoing controversy regarding screening and potential treatment outcomes of prostate cancer has been discussed with the patient. The meaning of a false positive PSA and a false negative PSA has been discussed. He indicates understanding of the limitations of this screening test and wishes  to proceed with screening PSA testing. Lung cancer screening - agrees to referral.   Lab Results  Component Value Date   PSA 0.54 11/24/2020      Immunization History  Administered Date(s) Administered   Tdap 06/07/2018  Flu, covid, rsv and shingles vaccines  declined.   No results found. Appt tomorrow - glasses working well.   Dental: every 6months.   Alcohol:none  Tobacco: 1ppd -see above.   Exercise:physical work, no regular exercise otherwise.  Some fatigue at times, decreased sex drive. Strength ok. Some difficulty with full erections at times. Requests testosterone level. No change in relationship - single. Prior use of viagra.  No chest pain or dyspnea.   History Patient Active Problem List   Diagnosis Date Noted   Nontraumatic complete tear of right rotator cuff    Degenerative superior labral anterior-to-posterior (SLAP) tear of right shoulder    Coronary artery calcification    Hyperlipidemia    Biceps tendonitis on right    Rotator cuff arthropathy 02/16/2019   FHx: colon cancer 12/18/2018   Attention deficit disorder (ADD) in adult 11/10/2017   Moderate episode of recurrent major depressive disorder ([name]) 08/31/2017   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/31/2017   Tobacco abuse 08/31/2017   History of substance abuse ([name]) 02/23/2017   Depression 02/02/2013   Past Medical History:  Diagnosis Date   ADD (attention deficit disorder)    Anxiety    Arthritis    feet, shoulders   Cancer ([name])    skin cancer   Carpal tunnel syndrome    Chronic dyspnea    COPD (chronic obstructive pulmonary disease) ([name])    Coronary artery calcification    noted on CT in 10/2020   Depression    Hyperlipidemia    Pre-diabetes    Tobacco abuse    Past Surgical History:  Procedure Laterality Date   BICEPT TENODESIS Right 03/14/2022   Procedure: BICEPS TENODESIS;  Surgeon: Cammy Copa, MD;  [facility];  Service: Orthopedics;  Laterality: Right;   CARPAL TUNNEL RELEASE Right 11/13/2020   Procedure: RIGHT CARPAL TUNNEL RELEASE;  Surgeon: Cammy Copa, MD;  Location: [facility];  Service: Orthopedics;  Laterality: Right;   CHOLECYSTECTOMY     COLONOSCOPY     2000   GALLBLADDER SURGERY  2003   SHOULDER  ARTHROSCOPY Left 02/16/2019   LEFT SHOULDER ARTHROSCOPY LOWER TRAPEZIUS TENDON TRANSFER, BICEPS TENODESIS   SHOULDER ARTHROSCOPY WITH DEBRIDEMENT AND BICEP TENDON REPAIR Left 02/16/2019   Procedure: LEFT SHOULDER ARTHROSCOPY LOWER TRAPEZIUS TENDON TRANSFER, BICEPS TENODESIS;  Surgeon: Cammy Copa, MD;  Location: [facility] OR;  Service: Orthopedics;  Laterality: Left;   SHOULDER ARTHROSCOPY WITH OPEN ROTATOR CUFF REPAIR AND DISTAL CLAVICLE ACROMINECTOMY Right 03/14/2022   Procedure: RIGHT SHOULDER ARTHROSCOPY, DEBRIDEMENT, MINI OPEN ROTATOR CUFF TEAR REPAIR;  Surgeon: Cammy Copa, MD;  Location: [facility] OR;  Service: Orthopedics;  Laterality: Right;   SKIN BIOPSY  2019   Allergies  Allergen Reactions   Hydrocodone Anaphylaxis   Zoloft [Sertraline] Other (See Comments)    sedating   Prior to Admission medications   Medication Sig Start Date End Date Taking? Authorizing Provider  acyclovir (ZOVIRAX) 400 MG tablet Take 1 tablet (400 mg total) by mouth 2 (two) times daily. 12/30/22  Yes Shade Flood, MD  amphetamine-dextroamphetamine (ADDERALL) 15 MG tablet TAKE 1 TABLET BY MOUTH DAILY WITH AN ADDITIONAL HALF A TABLET IN THE AFTERNOON IF NEEDED. Strength: 15 mg 12/30/22  Yes Shade Flood, MD  aspirin EC 81 MG tablet Take 1 tablet (81 mg total) by mouth daily. Swallow whole. Patient taking differently: Take 81 mg by mouth every evening. Swallow whole. 05/28/21  Yes O'Neal, Ronnald Ramp, MD  citalopram (CELEXA) 40 MG tablet Take 1 tablet (40 mg total) by mouth daily. 06/03/22  Yes Shade Flood, MD  diclofenac (VOLTAREN) 75 MG EC tablet TAKE 1 TABLET BY MOUTH TWICE A DAY 04/10/22  Yes Magnant, Charles L, PA-C  gabapentin (NEURONTIN) 300 MG capsule Take 1 capsule (300 mg total) by mouth every morning AND 2 capsules (600 mg total) at bedtime. 06/03/22  Yes Shade Flood, MD  methocarbamol (ROBAXIN) 500 MG tablet Take 1 tablet (500 mg total) by mouth every 8 (eight) hours as needed for  muscle spasms. 03/14/22  Yes Magnant, Charles L, PA-C  rosuvastatin (CRESTOR) 40 MG tablet Take 1 tablet (40 mg total) by mouth daily. Patient taking differently: Take 40 mg by mouth at bedtime. 10/05/21 03/12/24 Yes Marjie Skiff E, PA-C  Varenicline Tartrate, Starter, (CHANTIX STARTING MONTH PAK) 0.5 MG X 11 & 1 MG X 42 TBPK Take one 0.5 mg tablet by mouth once daily for 3 days, then increase to one 0.5 mg tablet twice daily for 4 days, then increase to one 1 mg tablet twice daily. 06/03/22  Yes Shade Flood, MD   Social History   Socioeconomic History   Marital status: Single    Spouse name: Not on file   Number of children: 3   Years of education: Not on file   Highest education level: Not on file  Occupational History   Not on file  Tobacco Use   Smoking status: Every Day    Packs/day: 1.00    Years: 50.00    Additional pack years: 0.00    Total pack years: 50.00    Types: Cigarettes   Smokeless tobacco: Never  Vaping Use   Vaping Use: Former  Substance and Sexual Activity   Alcohol use: No   Drug use: No    Frequency: 7.0 times per week    Types: IV    Comment: history of Rx opoid dependency   Sexual activity: Not Currently  Other Topics Concern   Not on file  Social History Narrative   Not on file   Social Determinants of Health   Financial Resource Strain: Not on file  Food Insecurity: Not on file  Transportation Needs: Not on file  Physical Activity: Not on file  Stress: Not on file  Social Connections: Not on file  Intimate Partner Violence: Not on file    Review of Systems 13 point review of systems per patient health survey noted.  Negative other than as indicated above or in HPI.    Objective:   Vitals:   01/09/23 1336  BP: 124/76  Pulse: 77  Temp: 98.1 F (36.7 C)  TempSrc: Temporal  SpO2: 97%  Weight: 187 lb (84.8 kg)  Height: 5\' 10"  (1.778 m)     Physical Exam Vitals reviewed.  Constitutional:      Appearance: He is  well-developed.  HENT:     Head: Normocephalic and atraumatic.     Right Ear: External ear normal.     Left Ear: External ear normal.  Eyes:     Conjunctiva/sclera: Conjunctivae normal.     Pupils: Pupils are equal, round, and reactive to light.  Neck:     Thyroid: No thyromegaly.  Cardiovascular:     Rate and Rhythm: Normal rate and regular rhythm.     Heart sounds: Normal heart sounds.  Pulmonary:     Effort: Pulmonary effort is normal. No respiratory distress.     Breath sounds: Normal breath sounds. No wheezing.  Abdominal:     General: There is no distension.     Palpations: Abdomen is soft.     Tenderness: There is no abdominal tenderness.  Musculoskeletal:        General: No tenderness. Normal range of motion.     Cervical back: Normal range of motion and neck supple.  Lymphadenopathy:     Cervical: No cervical adenopathy.  Skin:    General: Skin is warm and dry.  Neurological:     Mental Status: He is alert and oriented to person, place, and time.     Deep Tendon Reflexes: Reflexes are normal and symmetric.  Psychiatric:        Behavior: Behavior normal.        Assessment & Plan:  Louis Zimmerman is a 62 y.o. male . Annual physical exam  - -anticipatory guidance as below in AVS, screening labs above. Health maintenance items as above in HPI discussed/recommended as applicable.   Cigarette nicotine dependence without complication - Plan: Varenicline Tartrate, Starter, (CHANTIX STARTING MONTH PAK) 0.5 MG X 11 & 1 MG X 42 TBPK  -Cessation discussed, Chantix prescribed when ready but recommended techniques for managing stress at work as that appears to be trigger, option to meet with therapist to also discuss stress management to improve chances of success with smoking cessation.  Once he starts Chantix, can call for refill pack.  Refer for lung cancer screening.  Hyperlipidemia, unspecified hyperlipidemia type - Plan: Comprehensive metabolic panel, Lipid panel,  rosuvastatin (CRESTOR) 40 MG tablet  -Tolerating current regimen with Crestor, check labs.  No med changes for now.  Screening for prostate cancer - Plan: PSA  Situational stress  -As above, handout given, reports overall stable depression/anxiety, no change in Celexa dose for now.  Numbers provided for therapist if needed.  Fatigue, unspecified type - Plan: CBC with Differential/Platelet, Testosterone, TSH  -Denies acute changes, no chest pains or dyspnea.  Screening labs initially as above and plans to follow-up to discuss further.  RTC/ER precautions if acute changes  Erectile dysfunction, unspecified erectile dysfunction type  -Check testosterone as above, plan to discuss further at scheduled follow-up visit.  Screening for diabetes mellitus - Plan: Hemoglobin A1c  Screening for lung cancer - Plan: Ambulatory Referral Lung Cancer Screening [facility]  Screen for colon cancer - Plan: Ambulatory referral to Gastroenterology   Meds ordered this encounter  Medications   Varenicline Tartrate, Starter, (CHANTIX STARTING MONTH PAK) 0.5 MG X 11 & 1 MG X 42 TBPK    Sig: Take one 0.5 mg tablet by mouth once daily for 3 days, then increase  to one 0.5 mg tablet twice daily for 4 days, then increase to one 1 mg tablet twice daily.    Dispense:  53 each    Refill:  0   rosuvastatin (CRESTOR) 40 MG tablet    Sig: Take 1 tablet (40 mg total) by mouth at bedtime.    Dispense:  90 tablet    Refill:  1   Patient Instructions  See info on managing stress and here is a number to meet with someone to discuss stress management. Once you feel that is improving we should try Chantix again. That was sent today when you are ready. Call for refills.   I will check labs including cholesterol and testosterone. Please follow up to discuss those results and erectile dysfunction options as well as fatigue.  Return to the clinic or go to the nearest emergency room if any of your symptoms worsen or new  symptoms occur.  Here are a few options for counseling:  [facility]:  [phone]  [facility] [phone]  Managing Stress, Adult Feeling a certain amount of stress is normal. Stress helps our body and mind get ready to deal with the demands of life. Stress hormones can motivate you to do well at work and meet your responsibilities. But severe or long-term (chronic) stress can affect your mental and physical health. Chronic stress puts you at higher risk for: Anxiety and depression. Other health problems such as digestive problems, muscle aches, heart disease, high blood pressure, and stroke. What are the causes? Common causes of stress include: Demands from work, such as deadlines, feeling overworked, or having long hours. Pressures at home, such as money issues, disagreements with a spouse, or parenting issues. Pressures from major life changes, such as divorce, moving, loss of a loved one, or chronic illness. You may be at higher risk for stress-related problems if you: Do not get enough sleep. Are in poor health. Do not have emotional support. Have a mental health disorder such as anxiety or depression. How to recognize stress Stress can make you: Have trouble sleeping. Feel sad, anxious, irritable, or overwhelmed. Lose your appetite. Overeat or want to eat unhealthy foods. Want to use drugs or alcohol. Stress can also cause physical symptoms, such as: Sore, tense muscles, especially in the shoulders and neck. Headaches. Trouble breathing. A faster heart rate. Stomach pain, nausea, or vomiting. Diarrhea or constipation. Trouble concentrating. Follow these instructions at home: Eating and drinking Eat a healthy diet. This includes: Eating foods that are high in fiber, such as beans, whole grains, and fresh fruits and vegetables. Limiting foods that are high in fat and processed sugars, such as fried or sweet foods. Do not  skip meals or overeat. Drink enough fluid to keep your urine pale yellow. Alcohol use Do not drink alcohol if: Your health care provider tells you not to drink. You are pregnant, may be pregnant, or are planning to become pregnant. Drinking alcohol is a way some people try to ease their stress. This can be dangerous, so if you drink alcohol: Limit how much you have to: 0-1 drink a day for women. 0-2 drinks a day for men. Know how much alcohol is in your drink. In the [place] one drink equals one 12 oz bottle of beer (355 mL), one 5 oz glass of wine (148 mL), or one 1 oz glass of hard liquor (44 mL). Activity  Include 30 minutes of exercise in your daily schedule. Exercise is a good stress reducer.  Include time in your day for an activity that you find relaxing. Try taking a walk, going on a bike ride, reading a book, or listening to music. Schedule your time in a way that lowers stress, and keep a regular schedule. Focus on doing what is most important to get done. Lifestyle Identify the source of your stress and your reaction to it. See a therapist who can help you change unhelpful reactions. When there are stressful events: Talk about them with family, friends, or coworkers. Try to think realistically about stressful events and not ignore them or overreact. Try to find the positives in a stressful situation and not focus on the negatives. Cut back on responsibilities at work and home, if possible. Ask for help from friends or family members if you need it. Find ways to manage stress, such as: Mindfulness, meditation, or deep breathing. Yoga or tai chi. Progressive muscle relaxation. Spending time in nature. Doing art, playing music, or reading. Making time for fun activities. Spending time with family and friends. Get support from family, friends, or spiritual resources. General instructions Get enough sleep. Try to go to sleep and get up at about the same time every day. Take  over-the-counter and prescription medicines only as told by your health care provider. Do not use any products that contain nicotine or tobacco. These products include cigarettes, chewing tobacco, and vaping devices, such as e-cigarettes. If you need help quitting, ask your health care provider. Do not use drugs or smoke to deal with stress. Keep all follow-up visits. This is important. Where to find support Talk with your health care provider about stress management or finding a support group. Find a therapist to work with you on your stress management techniques. Where to find more information [employer] on Mental Illness: www.nami.org American Psychological Association: [employer] Contact a health care provider if: Your stress symptoms get worse. You are unable to manage your stress at home. You are struggling to stop using drugs or alcohol. Get help right away if: You may be a danger to yourself or others. You have any thoughts of death or suicide. Get help right awayif you feel like you may hurt yourself or others, or have thoughts about taking your own life. Go to your nearest emergency room or: Call 911. Call the National Suicide Prevention Lifeline at 917-458-5390 or 988 in the [place] This is open 24 hours a day. Text the Crisis Text Line at (580)831-2257. Summary Feeling a certain amount of stress is normal, but severe or long-term (chronic) stress can affect your mental and physical health. Chronic stress can put you at higher risk for anxiety, depression, and other health problems such as digestive problems, muscle aches, heart disease, high blood pressure, and stroke. You may be at higher risk for stress-related problems if you do not get enough sleep, are in poor health, lack emotional support, or have a mental health disorder such as anxiety or depression. Identify the source of your stress and your reaction to it. Try talking about stressful events with family, friends, or  coworkers, finding a coping method, or getting support from spiritual resources. If you need more help, talk with your health care provider about finding a support group or a mental health therapist. This information is not intended to replace advice given to you by your health care provider. Make sure you discuss any questions you have with your health care provider. Document Revised: 02/08/2021 Document Reviewed: 02/06/2021 Elsevier Patient Education  2024 [employer].  Preventive Care 61-29 Years Old, Male Preventive care refers to lifestyle choices and visits with your health care provider that can promote health and wellness. Preventive care visits are also called wellness exams. What can I expect for my preventive care visit? Counseling During your preventive care visit, your health care provider may ask about your: Medical history, including: Past medical problems. Family medical history. Current health, including: Emotional well-being. Home life and relationship well-being. Sexual activity. Lifestyle, including: Alcohol, nicotine or tobacco, and drug use. Access to firearms. Diet, exercise, and sleep habits. Safety issues such as seatbelt and bike helmet use. Sunscreen use. Work and work Astronomer. Physical exam Your health care provider will check your: Height and weight. These may be used to calculate your BMI (body mass index). BMI is a measurement that tells if you are at a healthy weight. Waist circumference. This measures the distance around your waistline. This measurement also tells if you are at a healthy weight and may help predict your risk of certain diseases, such as type 2 diabetes and high blood pressure. Heart rate and blood pressure. Body temperature. Skin for abnormal spots. What immunizations do I need?  Vaccines are usually given at various ages, according to a schedule. Your health care provider will recommend vaccines for you based on your age,  medical history, and lifestyle or other factors, such as travel or where you work. What tests do I need? Screening Your health care provider may recommend screening tests for certain conditions. This may include: Lipid and cholesterol levels. Diabetes screening. This is done by checking your blood sugar (glucose) after you have not eaten for a while (fasting). Hepatitis B test. Hepatitis C test. HIV (human immunodeficiency virus) test. STI (sexually transmitted infection) testing, if you are at risk. Lung cancer screening. Prostate cancer screening. Colorectal cancer screening. Talk with your health care provider about your test results, treatment options, and if necessary, the need for more tests. Follow these instructions at home: Eating and drinking  Eat a diet that includes fresh fruits and vegetables, whole grains, lean protein, and low-fat dairy products. Take vitamin and mineral supplements as recommended by your health care provider. Do not drink alcohol if your health care provider tells you not to drink. If you drink alcohol: Limit how much you have to 0-2 drinks a day. Know how much alcohol is in your drink. In the [place] one drink equals one 12 oz bottle of beer (355 mL), one 5 oz glass of wine (148 mL), or one 1 oz glass of hard liquor (44 mL). Lifestyle Brush your teeth every morning and night with fluoride toothpaste. Floss one time each day. Exercise for at least 30 minutes 5 or more days each week. Do not use any products that contain nicotine or tobacco. These products include cigarettes, chewing tobacco, and vaping devices, such as e-cigarettes. If you need help quitting, ask your health care provider. Do not use drugs. If you are sexually active, practice safe sex. Use a condom or other form of protection to prevent STIs. Take aspirin only as told by your health care provider. Make sure that you understand how much to take and what form to take. Work with your health  care provider to find out whether it is safe and beneficial for you to take aspirin daily. Find healthy ways to manage stress, such as: Meditation, yoga, or listening to music. Journaling. Talking to a trusted person. Spending time with friends and family. Minimize exposure to UV radiation to  reduce your risk of skin cancer. Safety Always wear your seat belt while driving or riding in a vehicle. Do not drive: If you have been drinking alcohol. Do not ride with someone who has been drinking. When you are tired or distracted. While texting. If you have been using any mind-altering substances or drugs. Wear a helmet and other protective equipment during sports activities. If you have firearms in your house, make sure you follow all gun safety procedures. What's next? Go to your health care provider once a year for an annual wellness visit. Ask your health care provider how often you should have your eyes and teeth checked. Stay up to date on all vaccines. This information is not intended to replace advice given to you by your health care provider. Make sure you discuss any questions you have with your health care provider. Document Revised: 01/10/2021 Document Reviewed: 01/10/2021 Elsevier Patient Education  2024 Elsevier Inc.      Signed,   Meredith Staggers, MD [facility], [facility] 01/09/23 2:19 PM

## 2023-01-09 NOTE — Patient Instructions (Addendum)
See info on managing stress and here is a number to meet with someone to discuss stress management. Once you feel that is improving we should try Chantix again. That was sent today when you are ready. Call for refills.   I will check labs including cholesterol and testosterone. Please follow up to discuss those results and erectile dysfunction options as well as fatigue.  Return to the clinic or go to the nearest emergency room if any of your symptoms worsen or new symptoms occur.  Here are a few options for counseling:  [facility]:  [phone]  [facility] [phone]  Managing Stress, Adult Feeling a certain amount of stress is normal. Stress helps our body and mind get ready to deal with the demands of life. Stress hormones can motivate you to do well at work and meet your responsibilities. But severe or long-term (chronic) stress can affect your mental and physical health. Chronic stress puts you at higher risk for: Anxiety and depression. Other health problems such as digestive problems, muscle aches, heart disease, high blood pressure, and stroke. What are the causes? Common causes of stress include: Demands from work, such as deadlines, feeling overworked, or having long hours. Pressures at home, such as money issues, disagreements with a spouse, or parenting issues. Pressures from major life changes, such as divorce, moving, loss of a loved one, or chronic illness. You may be at higher risk for stress-related problems if you: Do not get enough sleep. Are in poor health. Do not have emotional support. Have a mental health disorder such as anxiety or depression. How to recognize stress Stress can make you: Have trouble sleeping. Feel sad, anxious, irritable, or overwhelmed. Lose your appetite. Overeat or want to eat unhealthy foods. Want to use drugs or alcohol. Stress can also cause physical symptoms, such as: Sore, tense  muscles, especially in the shoulders and neck. Headaches. Trouble breathing. A faster heart rate. Stomach pain, nausea, or vomiting. Diarrhea or constipation. Trouble concentrating. Follow these instructions at home: Eating and drinking Eat a healthy diet. This includes: Eating foods that are high in fiber, such as beans, whole grains, and fresh fruits and vegetables. Limiting foods that are high in fat and processed sugars, such as fried or sweet foods. Do not skip meals or overeat. Drink enough fluid to keep your urine pale yellow. Alcohol use Do not drink alcohol if: Your health care provider tells you not to drink. You are pregnant, may be pregnant, or are planning to become pregnant. Drinking alcohol is a way some people try to ease their stress. This can be dangerous, so if you drink alcohol: Limit how much you have to: 0-1 drink a day for women. 0-2 drinks a day for men. Know how much alcohol is in your drink. In the [place] one drink equals one 12 oz bottle of beer (355 mL), one 5 oz glass of wine (148 mL), or one 1 oz glass of hard liquor (44 mL). Activity  Include 30 minutes of exercise in your daily schedule. Exercise is a good stress reducer. Include time in your day for an activity that you find relaxing. Try taking a walk, going on a bike ride, reading a book, or listening to music. Schedule your time in a way that lowers stress, and keep a regular schedule. Focus on doing what is most important to get done. Lifestyle Identify the source of your stress and your reaction to it. See a therapist who can help you change  unhelpful reactions. When there are stressful events: Talk about them with family, friends, or coworkers. Try to think realistically about stressful events and not ignore them or overreact. Try to find the positives in a stressful situation and not focus on the negatives. Cut back on responsibilities at work and home, if possible. Ask for help from friends  or family members if you need it. Find ways to manage stress, such as: Mindfulness, meditation, or deep breathing. Yoga or tai chi. Progressive muscle relaxation. Spending time in nature. Doing art, playing music, or reading. Making time for fun activities. Spending time with family and friends. Get support from family, friends, or spiritual resources. General instructions Get enough sleep. Try to go to sleep and get up at about the same time every day. Take over-the-counter and prescription medicines only as told by your health care provider. Do not use any products that contain nicotine or tobacco. These products include cigarettes, chewing tobacco, and vaping devices, such as e-cigarettes. If you need help quitting, ask your health care provider. Do not use drugs or smoke to deal with stress. Keep all follow-up visits. This is important. Where to find support Talk with your health care provider about stress management or finding a support group. Find a therapist to work with you on your stress management techniques. Where to find more information [employer] Mental Illness: www.nami.org American Psychological Association: DiceTour[employer].ca Contact a health care provider if: Your stress symptoms get worse. You are unable to manage your stress at home. You are struggling to stop using drugs or alcohol. Get help right away if: You may be a danger to yourself or others. You have any thoughts of death or suicide. Get help right awayif you feel like you may hurt yourself or others, or have thoughts about taking your own life. Go to your nearest emergency room or: Call 911. Call the National Suicide Prevention Lifeline at (905)426-9197 or 988 in the [place] This is open 24 hours a day. Text the Crisis Text Line at 234 338 7070. Summary Feeling a certain amount of stress is normal, but severe or long-term (chronic) stress can affect your mental and physical health. Chronic stress can put  you at higher risk for anxiety, depression, and other health problems such as digestive problems, muscle aches, heart disease, high blood pressure, and stroke. You may be at higher risk for stress-related problems if you do not get enough sleep, are in poor health, lack emotional support, or have a mental health disorder such as anxiety or depression. Identify the source of your stress and your reaction to it. Try talking about stressful events with family, friends, or coworkers, finding a coping method, or getting support from spiritual resources. If you need more help, talk with your health care provider about finding a support group or a mental health therapist. This information is not intended to replace advice given to you by your health care provider. Make sure you discuss any questions you have with your health care provider. Document Revised: 02/08/2021 Document Reviewed: 02/06/2021 Elsevier Patient Education  2024 [employer].  Preventive Care 41-4 Years Old, Male Preventive care refers to lifestyle choices and visits with your health care provider that can promote health and wellness. Preventive care visits are also called wellness exams. What can I expect for my preventive care visit? Counseling During your preventive care visit, your health care provider may ask about your: Medical history, including: Past medical problems. Family medical history. Current health, including: Emotional well-being. Home  life and relationship well-being. Sexual activity. Lifestyle, including: Alcohol, nicotine or tobacco, and drug use. Access to firearms. Diet, exercise, and sleep habits. Safety issues such as seatbelt and bike helmet use. Sunscreen use. Work and work Astronomer. Physical exam Your health care provider will check your: Height and weight. These may be used to calculate your BMI (body mass index). BMI is a measurement that tells if you are at a healthy weight. Waist  circumference. This measures the distance around your waistline. This measurement also tells if you are at a healthy weight and may help predict your risk of certain diseases, such as type 2 diabetes and high blood pressure. Heart rate and blood pressure. Body temperature. Skin for abnormal spots. What immunizations do I need?  Vaccines are usually given at various ages, according to a schedule. Your health care provider will recommend vaccines for you based on your age, medical history, and lifestyle or other factors, such as travel or where you work. What tests do I need? Screening Your health care provider may recommend screening tests for certain conditions. This may include: Lipid and cholesterol levels. Diabetes screening. This is done by checking your blood sugar (glucose) after you have not eaten for a while (fasting). Hepatitis B test. Hepatitis C test. HIV (human immunodeficiency virus) test. STI (sexually transmitted infection) testing, if you are at risk. Lung cancer screening. Prostate cancer screening. Colorectal cancer screening. Talk with your health care provider about your test results, treatment options, and if necessary, the need for more tests. Follow these instructions at home: Eating and drinking  Eat a diet that includes fresh fruits and vegetables, whole grains, lean protein, and low-fat dairy products. Take vitamin and mineral supplements as recommended by your health care provider. Do not drink alcohol if your health care provider tells you not to drink. If you drink alcohol: Limit how much you have to 0-2 drinks a day. Know how much alcohol is in your drink. In the [place] one drink equals one 12 oz bottle of beer (355 mL), one 5 oz glass of wine (148 mL), or one 1 oz glass of hard liquor (44 mL). Lifestyle Brush your teeth every morning and night with fluoride toothpaste. Floss one time each day. Exercise for at least 30 minutes 5 or more days each week. Do  not use any products that contain nicotine or tobacco. These products include cigarettes, chewing tobacco, and vaping devices, such as e-cigarettes. If you need help quitting, ask your health care provider. Do not use drugs. If you are sexually active, practice safe sex. Use a condom or other form of protection to prevent STIs. Take aspirin only as told by your health care provider. Make sure that you understand how much to take and what form to take. Work with your health care provider to find out whether it is safe and beneficial for you to take aspirin daily. Find healthy ways to manage stress, such as: Meditation, yoga, or listening to music. Journaling. Talking to a trusted person. Spending time with friends and family. Minimize exposure to UV radiation to reduce your risk of skin cancer. Safety Always wear your seat belt while driving or riding in a vehicle. Do not drive: If you have been drinking alcohol. Do not ride with someone who has been drinking. When you are tired or distracted. While texting. If you have been using any mind-altering substances or drugs. Wear a helmet and other protective equipment during sports activities. If you have firearms in your  house, make sure you follow all gun safety procedures. What's next? Go to your health care provider once a year for an annual wellness visit. Ask your health care provider how often you should have your eyes and teeth checked. Stay up to date on all vaccines. This information is not intended to replace advice given to you by your health care provider. Make sure you discuss any questions you have with your health care provider. Document Revised: 01/10/2021 Document Reviewed: 01/10/2021 Elsevier Patient Education  2024 [employer].

## 2023-01-10 LAB — COMPREHENSIVE METABOLIC PANEL
ALT: 37 U/L (ref 0–53)
AST: 17 U/L (ref 0–37)
Albumin: 4.3 g/dL (ref 3.5–5.2)
Alkaline Phosphatase: 78 U/L (ref 39–117)
BUN: 12 mg/dL (ref 6–23)
CO2: 23 mEq/L (ref 19–32)
Calcium: 9.6 mg/dL (ref 8.4–10.5)
Chloride: 103 mEq/L (ref 96–112)
Creatinine, Ser: 1.07 mg/dL (ref 0.40–1.50)
GFR: 75.08 mL/min (ref >60.00)
Glucose, Bld: 81 mg/dL (ref 70–99)
Potassium: 4.1 mEq/L (ref 3.5–5.1)
Sodium: 136 mEq/L (ref 135–145)
Total Bilirubin: 0.3 mg/dL (ref 0.2–1.2)
Total Protein: 6.8 g/dL (ref 6.0–8.3)

## 2023-01-10 LAB — LIPID PANEL
Cholesterol: 224 mg/dL — ABNORMAL HIGH (ref 0–200)
HDL: 31.8 mg/dL — ABNORMAL LOW (ref >39.00)
NonHDL: 192.3
Total CHOL/HDL Ratio: 7
Triglycerides: 385 mg/dL — ABNORMAL HIGH (ref 0.0–149.0)
VLDL: 77 mg/dL — ABNORMAL HIGH (ref 0.0–40.0)

## 2023-01-10 LAB — CBC WITH DIFFERENTIAL/PLATELET
Basophils Absolute: 0.1 10*3/uL (ref 0.0–0.1)
Basophils Relative: 0.9 % (ref 0.0–3.0)
Eosinophils Absolute: 0.3 10*3/uL (ref 0.0–0.7)
Eosinophils Relative: 3.1 % (ref 0.0–5.0)
HCT: 46.9 % (ref 39.0–52.0)
Hemoglobin: 15.7 g/dL (ref 13.0–17.0)
Lymphocytes Relative: 35.6 % (ref 12.0–46.0)
Lymphs Abs: 3.3 10*3/uL (ref 0.7–4.0)
MCHC: 33.4 g/dL (ref 30.0–36.0)
MCV: 92.1 fl (ref 78.0–100.0)
Monocytes Absolute: 0.5 10*3/uL (ref 0.1–1.0)
Monocytes Relative: 5.1 % (ref 3.0–12.0)
Neutro Abs: 5.2 10*3/uL (ref 1.4–7.7)
Neutrophils Relative %: 55.3 % (ref 43.0–77.0)
Platelets: 320 10*3/uL (ref 150.0–400.0)
RBC: 5.08 Mil/uL (ref 4.22–5.81)
RDW: 13.4 % (ref 11.5–15.5)
WBC: 9.4 10*3/uL (ref 4.0–10.5)

## 2023-01-10 LAB — LDL CHOLESTEROL, DIRECT: Direct LDL: 142 mg/dL

## 2023-01-10 LAB — PSA: PSA: 1.47 ng/mL (ref 0.10–4.00)

## 2023-01-10 LAB — TSH: TSH: 3.59 u[IU]/mL (ref 0.35–5.50)

## 2023-01-10 LAB — HEMOGLOBIN A1C: Hgb A1c MFr Bld: 6.1 % (ref 4.6–6.5)

## 2023-01-10 LAB — TESTOSTERONE: Testosterone: 286.11 ng/dL — ABNORMAL LOW (ref 300.00–890.00)

## 2023-01-14 ENCOUNTER — Other Ambulatory Visit: Payer: Self-pay | Admitting: Family Medicine

## 2023-01-14 DIAGNOSIS — R7989 Other specified abnormal findings of blood chemistry: Secondary | ICD-10-CM

## 2023-01-14 NOTE — Progress Notes (Signed)
Repeat testosterone - lab only visit, between 8 and 10 AM for most accurate results.

## 2023-02-13 ENCOUNTER — Ambulatory Visit: Payer: BC Managed Care – PPO | Admitting: Family Medicine

## 2023-02-13 ENCOUNTER — Encounter: Payer: Self-pay | Admitting: Family Medicine

## 2023-02-13 VITALS — BP 128/72 | HR 74 | Temp 97.8°F | Ht 70.0 in | Wt 192.2 lb

## 2023-02-13 DIAGNOSIS — R5383 Other fatigue: Secondary | ICD-10-CM | POA: Diagnosis not present

## 2023-02-13 DIAGNOSIS — R7989 Other specified abnormal findings of blood chemistry: Secondary | ICD-10-CM | POA: Diagnosis not present

## 2023-02-13 DIAGNOSIS — E785 Hyperlipidemia, unspecified: Secondary | ICD-10-CM

## 2023-02-13 DIAGNOSIS — N529 Male erectile dysfunction, unspecified: Secondary | ICD-10-CM

## 2023-02-13 LAB — COMPREHENSIVE METABOLIC PANEL
ALT: 34 U/L (ref 0–53)
AST: 17 U/L (ref 0–37)
Albumin: 4.2 g/dL (ref 3.5–5.2)
Alkaline Phosphatase: 75 U/L (ref 39–117)
BUN: 13 mg/dL (ref 6–23)
CO2: 24 mEq/L (ref 19–32)
Calcium: 9.8 mg/dL (ref 8.4–10.5)
Chloride: 105 mEq/L (ref 96–112)
Creatinine, Ser: 1.21 mg/dL (ref 0.40–1.50)
GFR: 64.74 mL/min (ref >60.00)
Glucose, Bld: 86 mg/dL (ref 70–99)
Potassium: 4.1 mEq/L (ref 3.5–5.1)
Sodium: 138 mEq/L (ref 135–145)
Total Bilirubin: 0.4 mg/dL (ref 0.2–1.2)
Total Protein: 6.5 g/dL (ref 6.0–8.3)

## 2023-02-13 LAB — LIPID PANEL
Cholesterol: 152 mg/dL (ref 0–200)
HDL: 35.3 mg/dL — ABNORMAL LOW (ref >39.00)
NonHDL: 116.43
Total CHOL/HDL Ratio: 4
Triglycerides: 260 mg/dL — ABNORMAL HIGH (ref 0.0–149.0)
VLDL: 52 mg/dL — ABNORMAL HIGH (ref 0.0–40.0)

## 2023-02-13 LAB — TESTOSTERONE: Testosterone: 273.32 ng/dL — ABNORMAL LOW (ref 300.00–890.00)

## 2023-02-13 LAB — VITAMIN D 25 HYDROXY (VIT D DEFICIENCY, FRACTURES): VITD: 27.46 ng/mL — ABNORMAL LOW (ref 30.00–100.00)

## 2023-02-13 LAB — LDL CHOLESTEROL, DIRECT: Direct LDL: 87 mg/dL

## 2023-02-13 NOTE — Patient Instructions (Addendum)
Please call and schedule CT scan with:  [facility] at [facility] [address], [place], [place] [address] [phone]  Testosterone level was low on initial testing, I will check again today.  If it is low again I would recommend meeting with urology as they can discuss treatment options as well as the various treatment options for erectile dysfunction as minimal relief with the prescribed medicines previously.  Let me know if you have questions.  I will recheck the cholesterol levels today since you have been on medication consistently and can adjust meds if needed.  Okay to continue over-the-counter vitamin D supplement, chronically that has improved your symptoms.  I will check levels today as well.  Take care

## 2023-02-13 NOTE — Progress Notes (Signed)
Subjective:  Patient ID: Louis Zimmerman, male    DOB: [birth year]  Age: 62 y.o. MRN: [id number]  CC:  Chief Complaint  Patient presents with   Results   Medical Management of Chronic Issues    Pt doing well     HPI Louis Zimmerman presents for   Follow-up from annual exam in June.  Nicotine addiction Referred for lung cancer screening at last visit, Chantix prescribed. Still smoking. Has chantix, not yet started.   Fatigue: Discussed last visit.  Additionally with erectile dysfunction.  Testosterone obtained that was low at 286.  Plan to repeat testing today. Other labs reassuring. Fatigue felt feels better with otc vit D and magnesium. Still some difficulty with maintaining erection. Has tried viagra or similar meds in past - minimal relief. Would like to check vitamin D.   Hyperlipidemia: Elevated on recent testing but had been off meds temporarily.  Continued on his Crestor 40 mg daily. Back on meds daily past month.  Last ate 5.5 hrs ago.  Lab Results  Component Value Date   CHOL 224 (H) 01/09/2023   HDL 31.80 (L) 01/09/2023   LDLCALC 137 (H) 11/24/2020   LDLDIRECT 142.0 01/09/2023   TRIG 385.0 (H) 01/09/2023   CHOLHDL 7 01/09/2023   Lab Results  Component Value Date   ALT 37 01/09/2023   AST 17 01/09/2023   ALKPHOS 78 01/09/2023   BILITOT 0.3 01/09/2023      History Patient Active Problem List   Diagnosis Date Noted   Nontraumatic complete tear of right rotator cuff    Degenerative superior labral anterior-to-posterior (SLAP) tear of right shoulder    Coronary artery calcification    Hyperlipidemia    Biceps tendonitis on right    Rotator cuff arthropathy 02/16/2019   FHx: colon cancer 12/18/2018   Attention deficit disorder (ADD) in adult 11/10/2017   Moderate episode of recurrent major depressive disorder ([name]) 08/31/2017   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/31/2017   Tobacco abuse 08/31/2017   History of substance abuse ([name]) 02/23/2017    Depression 02/02/2013   Past Medical History:  Diagnosis Date   ADD (attention deficit disorder)    Anxiety    Arthritis    feet, shoulders   Cancer ([name])    skin cancer   Carpal tunnel syndrome    Chronic dyspnea    COPD (chronic obstructive pulmonary disease) ([name])    Coronary artery calcification    noted on CT in 10/2020   Depression    Hyperlipidemia    Pre-diabetes    Tobacco abuse    Past Surgical History:  Procedure Laterality Date   BICEPT TENODESIS Right 03/14/2022   Procedure: BICEPS TENODESIS;  Surgeon: Cammy Copa, MD;  Location: [facility] OR;  Service: Orthopedics;  Laterality: Right;   CARPAL TUNNEL RELEASE Right 11/13/2020   Procedure: RIGHT CARPAL TUNNEL RELEASE;  Surgeon: Cammy Copa, MD;  Location:  SURGERY CENTER;  Service: Orthopedics;  Laterality: Right;   CHOLECYSTECTOMY     COLONOSCOPY     2000   GALLBLADDER SURGERY  2003   SHOULDER ARTHROSCOPY Left 02/16/2019   LEFT SHOULDER ARTHROSCOPY LOWER TRAPEZIUS TENDON TRANSFER, BICEPS TENODESIS   SHOULDER ARTHROSCOPY WITH DEBRIDEMENT AND BICEP TENDON REPAIR Left 02/16/2019   Procedure: LEFT SHOULDER ARTHROSCOPY LOWER TRAPEZIUS TENDON TRANSFER, BICEPS TENODESIS;  Surgeon: Cammy Copa, MD;  Location: [facility] OR;  Service: Orthopedics;  Laterality: Left;   SHOULDER ARTHROSCOPY WITH OPEN ROTATOR CUFF REPAIR AND DISTAL CLAVICLE ACROMINECTOMY  Right 03/14/2022   Procedure: RIGHT SHOULDER ARTHROSCOPY, DEBRIDEMENT, MINI OPEN ROTATOR CUFF TEAR REPAIR;  Surgeon: Cammy Copa, MD;  Location: [facility] OR;  Service: Orthopedics;  Laterality: Right;   SKIN BIOPSY  2019   Allergies  Allergen Reactions   Hydrocodone Anaphylaxis   Zoloft [Sertraline] Other (See Comments)    sedating   Prior to Admission medications   Medication Sig Start Date End Date Taking? Authorizing Provider  acyclovir (ZOVIRAX) 400 MG tablet Take 1 tablet (400 mg total) by mouth 2 (two) times daily. 12/30/22  Yes Shade Flood, MD   amphetamine-dextroamphetamine (ADDERALL) 15 MG tablet TAKE 1 TABLET BY MOUTH DAILY WITH AN ADDITIONAL HALF A TABLET IN THE AFTERNOON IF NEEDED. Strength: 15 mg 12/30/22  Yes Shade Flood, MD  aspirin EC 81 MG tablet Take 1 tablet (81 mg total) by mouth daily. Swallow whole. Patient taking differently: Take 81 mg by mouth every evening. Swallow whole. 05/28/21  Yes O'Neal, Ronnald Ramp, MD  citalopram (CELEXA) 40 MG tablet Take 1 tablet (40 mg total) by mouth daily. 06/03/22  Yes Shade Flood, MD  diclofenac (VOLTAREN) 75 MG EC tablet TAKE 1 TABLET BY MOUTH TWICE A DAY 04/10/22  Yes Magnant, Charles L, PA-C  gabapentin (NEURONTIN) 300 MG capsule Take 1 capsule (300 mg total) by mouth every morning AND 2 capsules (600 mg total) at bedtime. 06/03/22  Yes Shade Flood, MD  methocarbamol (ROBAXIN) 500 MG tablet Take 1 tablet (500 mg total) by mouth every 8 (eight) hours as needed for muscle spasms. 03/14/22  Yes Magnant, Charles L, PA-C  rosuvastatin (CRESTOR) 40 MG tablet Take 1 tablet (40 mg total) by mouth at bedtime. 01/09/23 04/09/23 Yes Shade Flood, MD  Varenicline Tartrate, Starter, (CHANTIX STARTING MONTH PAK) 0.5 MG X 11 & 1 MG X 42 TBPK Take one 0.5 mg tablet by mouth once daily for 3 days, then increase to one 0.5 mg tablet twice daily for 4 days, then increase to one 1 mg tablet twice daily. Patient not taking: Reported on 02/13/2023 01/09/23   Shade Flood, MD   Social History   Socioeconomic History   Marital status: Single    Spouse name: Not on file   Number of children: 3   Years of education: Not on file   Highest education level: Not on file  Occupational History   Not on file  Tobacco Use   Smoking status: Every Day    Current packs/day: 1.00    Average packs/day: 1 pack/day for 50.0 years (50.0 ttl pk-yrs)    Types: Cigarettes   Smokeless tobacco: Never  Vaping Use   Vaping status: Former  Substance and Sexual Activity   Alcohol use: No   Drug use: No     Frequency: 7.0 times per week    Types: IV    Comment: history of Rx opoid dependency   Sexual activity: Not Currently  Other Topics Concern   Not on file  Social History Narrative   Not on file   Social Determinants of Health   Financial Resource Strain: Not on file  Food Insecurity: Not on file  Transportation Needs: Not on file  Physical Activity: Not on file  Stress: Not on file  Social Connections: Not on file  Intimate Partner Violence: Not on file    Review of Systems Per hpi  Objective:   Vitals:   02/13/23 0856  BP: 128/72  Pulse: 74  Temp: 97.8 F (36.6 C)  TempSrc: Temporal  SpO2: 96%  Weight: 192 lb 3.2 oz (87.2 kg)  Height: 5\' 10"  (1.778 m)     Physical Exam Vitals reviewed.  Constitutional:      Appearance: He is well-developed.  HENT:     Head: Normocephalic and atraumatic.  Neck:     Vascular: No carotid bruit or JVD.  Cardiovascular:     Rate and Rhythm: Normal rate and regular rhythm.     Heart sounds: Normal heart sounds. No murmur heard. Pulmonary:     Effort: Pulmonary effort is normal.     Breath sounds: Normal breath sounds. No rales.  Musculoskeletal:     Right lower leg: No edema.     Left lower leg: No edema.  Skin:    General: Skin is warm and dry.  Neurological:     Mental Status: He is alert and oriented to person, place, and time.  Psychiatric:        Mood and Affect: Mood normal.       Assessment & Plan:  Louis Zimmerman is a 62 y.o. male . Low testosterone - Plan: Testosterone Erectile dysfunction, unspecified erectile dysfunction type  -Repeat testosterone to verify if true low, then if low reading will refer to urology as he has also experienced erectile dysfunction, difficulty maintaining erection, with minimal improvement with previous medications.  No new meds for now.  Hyperlipidemia, unspecified hyperlipidemia type - Plan: Comprehensive metabolic panel, Lipid panel  -Repeat labs as consistently using  meds past 4 weeks.  Adjust meds accordingly.  Fatigue, unspecified type - Plan: Vitamin D (25 hydroxy)  -Improved, has been taking vitamin D over-the-counter.  Check vitamin D level at his request, check testosterone as above as that could also be contributing to fatigue.  RTC precautions. No orders of the defined types were placed in this encounter.  Patient Instructions  Please call and schedule CT scan with:  [facility] at [facility] [address], [place], [place] [address] [phone]  Testosterone level was low on initial testing, I will check again today.  If it is low again I would recommend meeting with urology as they can discuss treatment options as well as the various treatment options for erectile dysfunction as minimal relief with the prescribed medicines previously.  Let me know if you have questions.  I will recheck the cholesterol levels today since you have been on medication consistently and can adjust meds if needed.  Okay to continue over-the-counter vitamin D supplement, chronically that has improved your symptoms.  I will check levels today as well.  Take care    Signed,   Meredith Staggers, MD [facility], [facility] 02/13/23 9:41 AM

## 2023-02-25 ENCOUNTER — Telehealth: Payer: Self-pay

## 2023-02-25 NOTE — Telephone Encounter (Signed)
-----   Message from Shade Flood sent at 02/24/2023 10:45 PM EDT ----- Results sent by MyChart, but appears patient has not yet reviewed those results.  Please call and make sure they have either seen note or discuss result note.  Thanks.

## 2023-02-26 NOTE — Telephone Encounter (Signed)
Left vm to call about labs 

## 2023-02-26 NOTE — Telephone Encounter (Signed)
  Cholesterol levels were elevated but improved from previous readings.  Vitamin D level was slightly low.  Depending on what dose you are taking over-the-counter, can increase that slightly.  Can then recheck levels in the next few months.  Testosterone levels were also slightly low, similar to 1 month ago.  Electrolytes, kidney, liver tests looked okay.  With that level of testosterone, we can certainly refer you to urology or endocrinology to discuss replacement options.  Let me know if that is okay and I will place that referral.   Dr. Neva Seat

## 2023-02-27 NOTE — Telephone Encounter (Signed)
Left vm to call office

## 2023-03-26 DIAGNOSIS — N5201 Erectile dysfunction due to arterial insufficiency: Secondary | ICD-10-CM | POA: Diagnosis not present

## 2023-03-26 DIAGNOSIS — E349 Endocrine disorder, unspecified: Secondary | ICD-10-CM | POA: Diagnosis not present

## 2023-04-01 DIAGNOSIS — E559 Vitamin D deficiency, unspecified: Secondary | ICD-10-CM | POA: Diagnosis not present

## 2023-04-01 DIAGNOSIS — R7303 Prediabetes: Secondary | ICD-10-CM | POA: Diagnosis not present

## 2023-04-01 DIAGNOSIS — B009 Herpesviral infection, unspecified: Secondary | ICD-10-CM | POA: Insufficient documentation

## 2023-04-01 DIAGNOSIS — E291 Testicular hypofunction: Secondary | ICD-10-CM | POA: Diagnosis not present

## 2023-04-01 DIAGNOSIS — R030 Elevated blood-pressure reading, without diagnosis of hypertension: Secondary | ICD-10-CM | POA: Diagnosis not present

## 2023-04-01 DIAGNOSIS — C4491 Basal cell carcinoma of skin, unspecified: Secondary | ICD-10-CM | POA: Insufficient documentation

## 2023-04-01 DIAGNOSIS — N529 Male erectile dysfunction, unspecified: Secondary | ICD-10-CM | POA: Diagnosis not present

## 2023-04-15 ENCOUNTER — Other Ambulatory Visit: Payer: Self-pay | Admitting: Family Medicine

## 2023-04-15 DIAGNOSIS — F988 Other specified behavioral and emotional disorders with onset usually occurring in childhood and adolescence: Secondary | ICD-10-CM

## 2023-04-16 NOTE — Telephone Encounter (Signed)
Controlled substance database reviewed.  Dextroamphetamine-amphetamine 15 mg #45 last filled on 03/10/2023.  Medications were discussed at his June visit.  Refill ordered.

## 2023-04-16 NOTE — Telephone Encounter (Signed)
Last office visit Follow up per note 1 yr

## 2023-04-23 DIAGNOSIS — E291 Testicular hypofunction: Secondary | ICD-10-CM | POA: Diagnosis not present

## 2023-04-23 DIAGNOSIS — R030 Elevated blood-pressure reading, without diagnosis of hypertension: Secondary | ICD-10-CM | POA: Diagnosis not present

## 2023-04-23 DIAGNOSIS — Z1331 Encounter for screening for depression: Secondary | ICD-10-CM | POA: Diagnosis not present

## 2023-04-23 DIAGNOSIS — R5383 Other fatigue: Secondary | ICD-10-CM | POA: Diagnosis not present

## 2023-04-23 DIAGNOSIS — R7303 Prediabetes: Secondary | ICD-10-CM | POA: Diagnosis not present

## 2023-06-05 DIAGNOSIS — E291 Testicular hypofunction: Secondary | ICD-10-CM | POA: Diagnosis not present

## 2023-06-05 DIAGNOSIS — R030 Elevated blood-pressure reading, without diagnosis of hypertension: Secondary | ICD-10-CM | POA: Diagnosis not present

## 2023-06-05 DIAGNOSIS — R5383 Other fatigue: Secondary | ICD-10-CM | POA: Diagnosis not present

## 2023-06-05 DIAGNOSIS — E559 Vitamin D deficiency, unspecified: Secondary | ICD-10-CM | POA: Diagnosis not present

## 2023-06-10 ENCOUNTER — Other Ambulatory Visit: Payer: Self-pay | Admitting: Family Medicine

## 2023-06-10 DIAGNOSIS — F988 Other specified behavioral and emotional disorders with onset usually occurring in childhood and adolescence: Secondary | ICD-10-CM

## 2023-06-11 NOTE — Telephone Encounter (Signed)
Requested Prescriptions   Pending Prescriptions Disp Refills   amphetamine-dextroamphetamine (ADDERALL) 15 MG tablet [Pharmacy Med Name: Amphetamine-Dextroamphetamine Oral Tablet 15 MG] 45 tablet 0    Sig: TAKE ONE TABLET BY MOUTH ONCE A DAY WITH AN ADDITIONAL HALF A TABLET IN THE AFTERNOON IF NEEDED     Date of patient request: 06/11/23 02/13/2023 08/20/2023 Date of last refill: 04/06/23 Last refill amount: 45

## 2023-06-11 NOTE — Telephone Encounter (Signed)
Medication discussed at his June 13 visit.  Controlled substance database reviewed.  #45 dextroamphetamine-amphetamine 15 mg tabs last filled on 04/16/2023, previously 03/10/2023.  Refill ordered.

## 2023-06-11 NOTE — Telephone Encounter (Signed)
Requested Prescriptions   Pending Prescriptions Disp Refills   amphetamine-dextroamphetamine (ADDERALL) 15 MG tablet [Pharmacy Med Name: Amphetamine-Dextroamphetamine Oral Tablet 15 MG] 45 tablet 0    Sig: TAKE ONE TABLET BY MOUTH ONCE A DAY WITH AN ADDITIONAL HALF A TABLET IN THE AFTERNOON IF NEEDED     Date of patient request: 06/11/23 02/13/2023 08/20/2023 Date of last refill: 04/16/23 Last refill amount: 45

## 2023-06-12 DIAGNOSIS — E559 Vitamin D deficiency, unspecified: Secondary | ICD-10-CM | POA: Diagnosis not present

## 2023-06-12 DIAGNOSIS — R030 Elevated blood-pressure reading, without diagnosis of hypertension: Secondary | ICD-10-CM | POA: Diagnosis not present

## 2023-06-12 DIAGNOSIS — E291 Testicular hypofunction: Secondary | ICD-10-CM | POA: Diagnosis not present

## 2023-06-12 DIAGNOSIS — R5383 Other fatigue: Secondary | ICD-10-CM | POA: Diagnosis not present

## 2023-06-19 DIAGNOSIS — G4709 Other insomnia: Secondary | ICD-10-CM | POA: Diagnosis not present

## 2023-06-19 DIAGNOSIS — Z720 Tobacco use: Secondary | ICD-10-CM | POA: Diagnosis not present

## 2023-06-19 DIAGNOSIS — R5383 Other fatigue: Secondary | ICD-10-CM | POA: Diagnosis not present

## 2023-06-19 DIAGNOSIS — I10 Essential (primary) hypertension: Secondary | ICD-10-CM | POA: Diagnosis not present

## 2023-07-01 DIAGNOSIS — Z720 Tobacco use: Secondary | ICD-10-CM | POA: Diagnosis not present

## 2023-07-01 DIAGNOSIS — E291 Testicular hypofunction: Secondary | ICD-10-CM | POA: Diagnosis not present

## 2023-07-01 DIAGNOSIS — R5383 Other fatigue: Secondary | ICD-10-CM | POA: Diagnosis not present

## 2023-07-01 DIAGNOSIS — I10 Essential (primary) hypertension: Secondary | ICD-10-CM | POA: Diagnosis not present

## 2023-07-03 DIAGNOSIS — I10 Essential (primary) hypertension: Secondary | ICD-10-CM | POA: Diagnosis not present

## 2023-07-03 DIAGNOSIS — E291 Testicular hypofunction: Secondary | ICD-10-CM | POA: Diagnosis not present

## 2023-07-28 DIAGNOSIS — E559 Vitamin D deficiency, unspecified: Secondary | ICD-10-CM | POA: Diagnosis not present

## 2023-07-28 DIAGNOSIS — R7303 Prediabetes: Secondary | ICD-10-CM | POA: Diagnosis not present

## 2023-07-28 DIAGNOSIS — E291 Testicular hypofunction: Secondary | ICD-10-CM | POA: Diagnosis not present

## 2023-07-28 DIAGNOSIS — R030 Elevated blood-pressure reading, without diagnosis of hypertension: Secondary | ICD-10-CM | POA: Diagnosis not present

## 2023-08-06 ENCOUNTER — Other Ambulatory Visit: Payer: Self-pay | Admitting: Family Medicine

## 2023-08-06 DIAGNOSIS — F988 Other specified behavioral and emotional disorders with onset usually occurring in childhood and adolescence: Secondary | ICD-10-CM

## 2023-08-06 NOTE — Telephone Encounter (Signed)
 Medication discussed at January 09, 2023 physical.  Controlled substance database reviewed.  #45 last filled on 06/11/2023.  Refill ordered.

## 2023-08-20 ENCOUNTER — Ambulatory Visit: Payer: BC Managed Care – PPO | Admitting: Family Medicine

## 2023-08-20 VITALS — BP 118/78 | HR 79 | Temp 98.3°F | Ht 70.0 in | Wt 193.6 lb

## 2023-08-20 DIAGNOSIS — F332 Major depressive disorder, recurrent severe without psychotic features: Secondary | ICD-10-CM

## 2023-08-20 DIAGNOSIS — R202 Paresthesia of skin: Secondary | ICD-10-CM

## 2023-08-20 DIAGNOSIS — F413 Other mixed anxiety disorders: Secondary | ICD-10-CM

## 2023-08-20 DIAGNOSIS — E785 Hyperlipidemia, unspecified: Secondary | ICD-10-CM | POA: Diagnosis not present

## 2023-08-20 DIAGNOSIS — Z1211 Encounter for screening for malignant neoplasm of colon: Secondary | ICD-10-CM

## 2023-08-20 DIAGNOSIS — F17210 Nicotine dependence, cigarettes, uncomplicated: Secondary | ICD-10-CM

## 2023-08-20 DIAGNOSIS — Z122 Encounter for screening for malignant neoplasm of respiratory organs: Secondary | ICD-10-CM

## 2023-08-20 DIAGNOSIS — F988 Other specified behavioral and emotional disorders with onset usually occurring in childhood and adolescence: Secondary | ICD-10-CM

## 2023-08-20 DIAGNOSIS — B009 Herpesviral infection, unspecified: Secondary | ICD-10-CM | POA: Diagnosis not present

## 2023-08-20 LAB — COMPREHENSIVE METABOLIC PANEL
ALT: 34 U/L (ref 0–53)
AST: 20 U/L (ref 0–37)
Albumin: 4.2 g/dL (ref 3.5–5.2)
Alkaline Phosphatase: 74 U/L (ref 39–117)
BUN: 13 mg/dL (ref 6–23)
CO2: 29 meq/L (ref 19–32)
Calcium: 9.7 mg/dL (ref 8.4–10.5)
Chloride: 101 meq/L (ref 96–112)
Creatinine, Ser: 1.11 mg/dL (ref 0.40–1.50)
GFR: 71.54 mL/min (ref >60.00)
Glucose, Bld: 84 mg/dL (ref 70–99)
Potassium: 3.9 meq/L (ref 3.5–5.1)
Sodium: 135 meq/L (ref 135–145)
Total Bilirubin: 0.4 mg/dL (ref 0.2–1.2)
Total Protein: 6.8 g/dL (ref 6.0–8.3)

## 2023-08-20 LAB — LIPID PANEL
Cholesterol: 256 mg/dL — ABNORMAL HIGH (ref 0–200)
HDL: 32.7 mg/dL — ABNORMAL LOW (ref >39.00)
LDL Cholesterol: 162 mg/dL — ABNORMAL HIGH (ref 0–99)
NonHDL: 223.11
Total CHOL/HDL Ratio: 8
Triglycerides: 307 mg/dL — ABNORMAL HIGH (ref 0.0–149.0)
VLDL: 61.4 mg/dL — ABNORMAL HIGH (ref 0.0–40.0)

## 2023-08-20 MED ORDER — ROSUVASTATIN CALCIUM 40 MG PO TABS
40.0000 mg | ORAL_TABLET | Freq: Every day | ORAL | 1 refills | Status: DC
Start: 2023-08-20 — End: 2024-02-10

## 2023-08-20 MED ORDER — VARENICLINE TARTRATE 1 MG PO TABS: 1.0000 mg | ORAL_TABLET | Freq: Two times a day (BID) | ORAL | 2 refills | Status: AC

## 2023-08-20 MED ORDER — VARENICLINE TARTRATE (STARTER) 0.5 MG X 11 & 1 MG X 42 PO TBPK: ORAL_TABLET | ORAL | 0 refills | Status: DC

## 2023-08-20 MED ORDER — ACYCLOVIR 400 MG PO TABS
400.0000 mg | ORAL_TABLET | Freq: Two times a day (BID) | ORAL | 0 refills | Status: DC
Start: 2023-08-20 — End: 2024-04-01

## 2023-08-20 NOTE — Patient Instructions (Addendum)
Here is a link for assistance with smoking cessation. We can try Chantix again. Make sure to set a quit date and use resources below to help you quit.   [email or web address]  Please bring copy of labs from your other provider to your next visit, but I will check some screening labs today for the medication I am prescribing.  No change in meds at this time.  Let me know if there are questions and take care.  I have adjusted your medication list. If you have increased depression/anxiety symptoms, restart the citalopram and let me know.

## 2023-08-20 NOTE — Progress Notes (Signed)
Subjective:  Patient ID: Louis Zimmerman, male    DOB: 1961/04/17  Age: 63 y.o. MRN: [id number]  CC:  Chief Complaint  Patient presents with   Medical Management of Chronic Issues    Pt notes doing well no concerns     HPI Louis Zimmerman presents for   Hyperlipidemia: Crestor 40 mg daily.  History of coronary artery calcification, cardiologist Dr. Bufford Buttner.  Also on aspirin daily. Lab Results  Component Value Date   CHOL 152 02/13/2023   HDL 35.30 (L) 02/13/2023   LDLCALC 137 (H) 11/24/2020   LDLDIRECT 87.0 02/13/2023   TRIG 260.0 (H) 02/13/2023   CHOLHDL 4 02/13/2023   Lab Results  Component Value Date   ALT 34 02/13/2023   AST 17 02/13/2023   ALKPHOS 75 02/13/2023   BILITOT 0.4 02/13/2023   Vitamin D deficiency Mildly low in July.  Increased over-the-counter dose recommended. Unknown dose. Did have vit D checked at [facility]. On higher dose.  Last vitamin D Lab Results  Component Value Date   VD25OH 27.46 (L) 02/13/2023   Low testosterone Mildly low at 286 in June 2024, repeat test July 2024 at 273.  Option to meet with endocrinology or urology given on result note. Went to [facility] in [place]. On testosterone supplement. Routine labs there. Elevated reading, but then off testosterone and restarting - ongoing care at that facility with monitoring labs.   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   COPD With history of tobacco use.  Prior Chantix attempts for cessation.  Not on inhalers, no dyspnea or cough.  Has seen pulmonary previously.  Repeat Chantix prescription in June 2024- took for 1 month, min benefit. Less smoking, but still smoking. No SI/anxiety or mood changes, occasional weird dream. Smoking 1 ppd approx 40 pack yr hx. No new cough. Chronic cough. No dyspnea. No hemoptysis. Interested in lung CA screening.   History of foot paresthesias Treated with gabapentin 300 mg in morning, 300-600 milligrams in afternoon, still working  well.no refill needed - some skipped days.   Depression/anxiety/ADD Takes Celexa 40 mg daily for depression and anxiety. Not taking celexa. Doing ok off medication.  Adderall 15mg  in the morning, half dose in afternoon for ADD symptoms.  Half dose in the morning has been ineffective previously.  He denies insomnia appetite changes new heart palpitations or side effects with these medicines.  Work stress has been trigger for smoking previously. Stable on current meds. Controlled substance database reviewed.  Dextroamphetamine dextroamphetamine last filled 08/07/2023, previously 06/11/2023, 04/16/2023.     01/09/2023    1:33 PM 11/24/2020    8:27 AM  GAD 7 : Generalized Anxiety Score  Nervous, Anxious, on Edge 0 1  Control/stop worrying 0 1  Worry too much - different things 0 2  Trouble relaxing 0 0  Restless 0 0  Easily annoyed or irritable 0 1  Afraid - awful might happen 0 0  Total GAD 7 Score 0 5       08/20/2023    8:23 AM 01/09/2023    1:33 PM 06/03/2022    8:43 AM 12/03/2021    9:10 AM 09/03/2021   10:15 AM  Depression screen PHQ 2/9  Decreased Interest 0 0 0 0 0  Down, Depressed, Hopeless 0 0 0 0 0  PHQ - 2 Score 0 0 0 0 0  Altered sleeping 0 0 0 0 0  Tired, decreased energy 0 0 2  0 0  Change in appetite 0 0 0 0 0  Feeling bad or failure about yourself  0 0 0 0 0  Trouble concentrating 0 0 0 1 1  Moving slowly or fidgety/restless 0 0 0 0 1  Suicidal thoughts  0 0 0 0  PHQ-9 Score 0 0 2 1 2    HM: Referred to gi for colonoscopy.  History Patient Active Problem List   Diagnosis Date Noted   Nontraumatic complete tear of right rotator cuff    Degenerative superior labral anterior-to-posterior (SLAP) tear of right shoulder    Coronary artery calcification    Hyperlipidemia    Biceps tendonitis on right    Rotator cuff arthropathy 02/16/2019   FHx: colon cancer 12/18/2018   Attention deficit disorder (ADD) in adult 11/10/2017   Moderate episode of recurrent major depressive  disorder ([name]) 08/31/2017   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/31/2017   Tobacco abuse 08/31/2017   History of substance abuse ([name]) 02/23/2017   Depression 02/02/2013   Past Medical History:  Diagnosis Date   ADD (attention deficit disorder)    Anxiety    Arthritis    feet, shoulders   Cancer ([name])    skin cancer   Carpal tunnel syndrome    Chronic dyspnea    COPD (chronic obstructive pulmonary disease) ([name])    Coronary artery calcification    noted on CT in 10/2020   Depression    Hyperlipidemia    Pre-diabetes    Tobacco abuse    Past Surgical History:  Procedure Laterality Date   BICEPT TENODESIS Right 03/14/2022   Procedure: BICEPS TENODESIS;  Surgeon: Cammy Copa, MD;  Location[facility];  Service: Orthopedics;  Laterality: Right;   CARPAL TUNNEL RELEASE Right 11/13/2020   Procedure: RIGHT CARPAL TUNNEL RELEASE;  Surgeon: Cammy Copa, MD;  Location: [facility];  Service: Orthopedics;  Laterality: Right;   CHOLECYSTECTOMY     COLONOSCOPY     2000   GALLBLADDER SURGERY  2003   SHOULDER ARTHROSCOPY Left 02/16/2019   LEFT SHOULDER ARTHROSCOPY LOWER TRAPEZIUS TENDON TRANSFER, BICEPS TENODESIS   SHOULDER ARTHROSCOPY WITH DEBRIDEMENT AND BICEP TENDON REPAIR Left 02/16/2019   Procedure: LEFT SHOULDER ARTHROSCOPY LOWER TRAPEZIUS TENDON TRANSFER, BICEPS TENODESIS;  Surgeon: Cammy Copa, MD;  Location: [facility] OR;  Service: Orthopedics;  Laterality: Left;   SHOULDER ARTHROSCOPY WITH OPEN ROTATOR CUFF REPAIR AND DISTAL CLAVICLE ACROMINECTOMY Right 03/14/2022   Procedure: RIGHT SHOULDER ARTHROSCOPY, DEBRIDEMENT, MINI OPEN ROTATOR CUFF TEAR REPAIR;  Surgeon: Cammy Copa, MD;  Location: [facility] OR;  Service: Orthopedics;  Laterality: Right;   SKIN BIOPSY  2019   Allergies  Allergen Reactions   Hydrocodone Anaphylaxis   Zoloft [Sertraline] Other (See Comments)    sedating   Prior to Admission medications   Medication Sig Start Date End Date Taking?  Authorizing Provider  acyclovir (ZOVIRAX) 400 MG tablet Take 1 tablet (400 mg total) by mouth 2 (two) times daily. 12/30/22  Yes Shade Flood, MD  amphetamine-dextroamphetamine (ADDERALL) 15 MG tablet TAKE ONE TABLET BY MOUTH ONCE DAILY WITH AN ADDITIONAL HALF A TABLET IN THE AFTERNOON IF NEEDED 08/06/23  Yes Shade Flood, MD  aspirin EC 81 MG tablet Take 1 tablet (81 mg total) by mouth daily. Swallow whole. Patient taking differently: Take 81 mg by mouth every evening. Swallow whole. 05/28/21  Yes O'Neal, Ronnald Ramp, MD  citalopram (CELEXA) 40 MG tablet Take 1 tablet (40 mg total) by mouth daily. 06/03/22  Yes Shade Flood, MD  diclofenac (VOLTAREN) 75 MG EC tablet TAKE 1 TABLET BY MOUTH TWICE A DAY 04/10/22  Yes Magnant, Charles L, PA-C  gabapentin (NEURONTIN) 300 MG capsule Take 1 capsule (300 mg total) by mouth every morning AND 2 capsules (600 mg total) at bedtime. 06/03/22  Yes Shade Flood, MD  losartan (COZAAR) 50 MG tablet Take 50 mg by mouth daily. 06/19/23  Yes [provider]  methocarbamol (ROBAXIN) 500 MG tablet Take 1 tablet (500 mg total) by mouth every 8 (eight) hours as needed for muscle spasms. 03/14/22  Yes Magnant, Charles L, PA-C  rosuvastatin (CRESTOR) 40 MG tablet Take 1 tablet (40 mg total) by mouth at bedtime. 01/09/23 04/09/23  Shade Flood, MD   Social History   Socioeconomic History   Marital status: Single    Spouse name: Not on file   Number of children: 3   Years of education: Not on file   Highest education level: Not on file  Occupational History   Not on file  Tobacco Use   Smoking status: Every Day    Current packs/day: 1.00    Average packs/day: 1 pack/day for 50.0 years (50.0 ttl pk-yrs)    Types: Cigarettes   Smokeless tobacco: Never  Vaping Use   Vaping status: Former  Substance and Sexual Activity   Alcohol use: No   Drug use: No    Frequency: 7.0 times per week    Types: IV    Comment: history of Rx opoid  dependency   Sexual activity: Not Currently  Other Topics Concern   Not on file  Social History Narrative   Not on file   Social Drivers of Health   Financial Resource Strain: Not on file  Food Insecurity: Not on file  Transportation Needs: Not on file  Physical Activity: Not on file  Stress: Not on file  Social Connections: Not on file  Intimate Partner Violence: Not on file    Review of Systems  Per HPI.  Objective:   Vitals:   08/20/23 0828  BP: 118/78  Pulse: 79  Temp: 98.3 F (36.8 C)  TempSrc: Temporal  SpO2: 98%  Weight: 193 lb 9.6 oz (87.8 kg)  Height: 5\' 10"  (1.778 m)     Physical Exam Vitals reviewed.  Constitutional:      Appearance: He is well-developed.  HENT:     Head: Normocephalic and atraumatic.  Neck:     Vascular: No carotid bruit or JVD.  Cardiovascular:     Rate and Rhythm: Normal rate and regular rhythm.     Heart sounds: Normal heart sounds. No murmur heard. Pulmonary:     Effort: Pulmonary effort is normal.     Breath sounds: Normal breath sounds. No rales.  Musculoskeletal:     Right lower leg: No edema.     Left lower leg: No edema.  Skin:    General: Skin is warm and dry.  Neurological:     Mental Status: He is alert and oriented to person, place, and time.  Psychiatric:        Mood and Affect: Mood normal.        Assessment & Plan:  Louis Zimmerman is a 63 y.o. male . Hyperlipidemia, unspecified hyperlipidemia type - Plan: rosuvastatin (CRESTOR) 40 MG tablet, Comprehensive metabolic panel, Lipid panel  -  Stable, tolerating current regimen. Medications refilled. Labs pending as above.   Screening for colon cancer - Plan: Ambulatory referral to Gastroenterology  Screening for lung cancer - Plan: Ambulatory Referral Lung Cancer Screening [facility]  Cigarette nicotine dependence without complication - Plan: varenicline (CHANTIX CONTINUING MONTH PAK) 1 MG tablet, Varenicline Tartrate, Starter, (CHANTIX  STARTING MONTH PAK) 0.5 MG X 11 & 1 MG X 42 TBPK  -Repeat Chantix requested, potential side effects/risk discussed.  Referred for lung cancer screening as above.  Link for smoking cessation resources given.  HSV infection - Plan: acyclovir (ZOVIRAX) 400 MG tablet  -Acyclovir refilled if needed.  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  -Stable ADD controlled with current med regimen.  Continue same with 67-month follow-up.  Paresthesia of foot, bilateral Stable,  no changes for now.  Meds ordered this encounter  Medications   rosuvastatin (CRESTOR) 40 MG tablet    Sig: Take 1 tablet (40 mg total) by mouth at bedtime.    Dispense:  90 tablet    Refill:  1   acyclovir (ZOVIRAX) 400 MG tablet    Sig: Take 1 tablet (400 mg total) by mouth 2 (two) times daily.    Dispense:  180 tablet    Refill:  0   varenicline (CHANTIX CONTINUING MONTH PAK) 1 MG tablet    Sig: Take 1 tablet (1 mg total) by mouth 2 (two) times daily.    Dispense:  60 tablet    Refill:  2   Varenicline Tartrate, Starter, (CHANTIX STARTING MONTH PAK) 0.5 MG X 11 & 1 MG X 42 TBPK    Sig: Take one 0.5 mg tablet by mouth once daily for 3 days, then increase to one 0.5 mg tablet twice daily for 4 days, then increase to one 1 mg tablet twice daily.    Dispense:  53 each    Refill:  0   Patient Instructions  Here is a link for assistance with smoking cessation. We can try Chantix again. Make sure to set a quit date and use resources below to help you quit.   [email or web address]  Please bring copy of labs from your other provider to your next visit, but I will check some screening labs today for the medication I am prescribing.  No change in meds at this  time.  Let me know if there are questions and take care.  I have adjusted your medication list. If you have increased depression/anxiety symptoms, restart the citalopram and let me know.      Signed,   Meredith Staggers, MD [facility], [facility] 08/20/23 9:17 AM

## 2023-08-22 ENCOUNTER — Encounter: Payer: Self-pay | Admitting: Family Medicine

## 2023-08-27 ENCOUNTER — Encounter: Payer: Self-pay | Admitting: Family Medicine

## 2023-09-01 ENCOUNTER — Telehealth: Payer: Self-pay

## 2023-09-01 NOTE — Telephone Encounter (Signed)
Cholesterol elevated, LDL 162.  Would like to see it half that level, but recommend any medication changes to be discussed with your cardiologist.  Electrolytes, kidney, liver test looked okay.  Let me know if there are questions.   Dr. Neva Seat

## 2023-09-16 DIAGNOSIS — D225 Melanocytic nevi of trunk: Secondary | ICD-10-CM | POA: Diagnosis not present

## 2023-09-16 DIAGNOSIS — L814 Other melanin hyperpigmentation: Secondary | ICD-10-CM | POA: Diagnosis not present

## 2023-09-16 DIAGNOSIS — L821 Other seborrheic keratosis: Secondary | ICD-10-CM | POA: Diagnosis not present

## 2023-09-24 ENCOUNTER — Other Ambulatory Visit: Payer: Self-pay | Admitting: Family Medicine

## 2023-09-24 DIAGNOSIS — E291 Testicular hypofunction: Secondary | ICD-10-CM | POA: Diagnosis not present

## 2023-09-24 DIAGNOSIS — F988 Other specified behavioral and emotional disorders with onset usually occurring in childhood and adolescence: Secondary | ICD-10-CM

## 2023-09-24 DIAGNOSIS — R7303 Prediabetes: Secondary | ICD-10-CM | POA: Diagnosis not present

## 2023-09-24 DIAGNOSIS — R030 Elevated blood-pressure reading, without diagnosis of hypertension: Secondary | ICD-10-CM | POA: Diagnosis not present

## 2023-09-24 DIAGNOSIS — E559 Vitamin D deficiency, unspecified: Secondary | ICD-10-CM | POA: Diagnosis not present

## 2023-09-24 NOTE — Telephone Encounter (Signed)
 Requested Prescriptions   Pending Prescriptions Disp Refills   amphetamine-dextroamphetamine (ADDERALL) 15 MG tablet [Pharmacy Med Name: Amphetamine-Dextroamphetamine Oral Tablet 15 MG] 45 tablet 0    Sig: take one tablet by mouth daily with an additional half a tablet in the afternoon if needed     Date of patient request: 09/24/2023 Last office visit: 08/20/2023 Upcoming visit: 02/26/2024 Date of last refill: 08/06/2023 Last refill amount: 45

## 2023-09-24 NOTE — Telephone Encounter (Signed)
 Office visit with discussion of medications January 22.  82-month follow-up planned.  Controlled substance database reviewed.  #45 last filled on 08/07/2023, refill ordered.

## 2023-09-30 DIAGNOSIS — N529 Male erectile dysfunction, unspecified: Secondary | ICD-10-CM | POA: Diagnosis not present

## 2023-09-30 DIAGNOSIS — E291 Testicular hypofunction: Secondary | ICD-10-CM | POA: Diagnosis not present

## 2023-09-30 DIAGNOSIS — I10 Essential (primary) hypertension: Secondary | ICD-10-CM | POA: Diagnosis not present

## 2023-09-30 DIAGNOSIS — R7303 Prediabetes: Secondary | ICD-10-CM | POA: Diagnosis not present

## 2023-10-16 ENCOUNTER — Encounter: Payer: Self-pay | Admitting: Orthopedic Surgery

## 2023-10-16 ENCOUNTER — Other Ambulatory Visit: Payer: Self-pay

## 2023-10-16 ENCOUNTER — Ambulatory Visit: Admitting: Orthopedic Surgery

## 2023-10-16 ENCOUNTER — Other Ambulatory Visit (INDEPENDENT_AMBULATORY_CARE_PROVIDER_SITE_OTHER): Payer: Self-pay

## 2023-10-16 DIAGNOSIS — M5412 Radiculopathy, cervical region: Secondary | ICD-10-CM | POA: Diagnosis not present

## 2023-10-16 DIAGNOSIS — M25512 Pain in left shoulder: Secondary | ICD-10-CM

## 2023-10-16 MED ORDER — METHYLPREDNISOLONE ACETATE 40 MG/ML IJ SUSP
20.0000 mg | INTRAMUSCULAR | Status: AC | PRN
  Administered 2023-10-16: 20 mg via INTRAMUSCULAR

## 2023-10-16 MED ORDER — BUPIVACAINE HCL 0.25 % IJ SOLN
1.0000 mL | INTRAMUSCULAR | Status: AC | PRN
  Administered 2023-10-16: 1 mL

## 2023-10-16 MED ORDER — LIDOCAINE HCL 1 % IJ SOLN
5.0000 mL | INTRAMUSCULAR | Status: AC | PRN
  Administered 2023-10-16: 5 mL

## 2023-10-16 NOTE — Progress Notes (Signed)
 Office Visit Note   Patient: Louis Zimmerman           Date of Birth: [birth year]           MRN: [id number] Visit Date: 10/16/2023 Requested by: Shade Flood, MD [address] [place],  [place] [address] PCP: Shade Flood, MD  Subjective: Chief Complaint  Patient presents with   Left Shoulder - Pain    HPI: Louis Zimmerman is a 63 year old patient with left shoulder trigger point type pain which she localizes to the scapula.  Describes some decreased range of motion.  He states he would like an injection.  Does also report some radicular pain involving the left arm.  Has numbness and tingling in that left arm with certain movements.  Overall he is doing well and is functioning well at work with his lower trapezius tendon transfer.              ROS: All systems reviewed are negative as they relate to the chief complaint within the history of present illness.  Patient denies  fevers or chills.   Assessment & Plan: Visit Diagnoses:  1. Left shoulder pain, unspecified chronicity     Plan: Impression is left shoulder pain with trigger point possibly related to lower trapezius tendon transfer harvest site.  Ultrasound-guided injection performed today.  I think also he may be having some left arm radiculopathy.  Shoulder cervical spine imaging studies do show some mild arthritis in that mid cervical region.  Symptoms ongoing now for well over 8 weeks with failure of conservative management.  Plan MRI scan cervical spine to evaluate left radiculopathy.  Trigger point injection tolerated well.  Follow-up after that study.  Follow-Up Instructions: No follow-ups on file.   Orders:  Orders Placed This Encounter  Procedures   XR Shoulder Left   US Guided Needle Placement - No Linked Charges   No orders of the defined types were placed in this encounter.     Procedures: Trigger Point Inj  Date/Time: 10/16/2023 5:40 PM  Performed by: Cammy Copa, MD Authorized by: Cammy Copa, MD   Consent Given by:  Patient Total # of Trigger Points:  1 Location: back   Needle Size:  22 G Approach:  Dorsal Medications #1:  5 mL lidocaine 1 %; 1 mL bupivacaine 0.25 %; 20 mg methylPREDNISolone acetate 40 MG/ML    Clinical Data: No additional findings.  Objective: Vital Signs: There were no vitals taken for this visit.  Physical Exam:   Constitutional: Patient appears well-developed HEENT:  Head: Normocephalic Eyes:EOM are normal Neck: Normal range of motion Cardiovascular: Normal rate Pulmonary/chest: Effort normal Neurologic: Patient is alert Skin: Skin is warm Psychiatric: Patient has normal mood and affect   Ortho Exam: Ortho exam demonstrates full active and passive range of motion of both shoulders.  Excellent external rotation strength at 5+ out of 5 bilaterally.  Does have some tenderness to palpation around the superior medial border of the scapula.  No scapulothoracic crepitus and no scapular winging is present.  Specialty Comments:  No specialty comments available.  Imaging: US Guided Needle Placement - No Linked Charges Result Date: 10/16/2023 Ultrasound imaging demonstrates needle placement adjacent to the scapula on the left-hand side with extravasation of fluid around the trigger point and no complicating features  XR Shoulder Left Result Date: 10/16/2023 AP outlet axillary lateral radiographs left shoulder reviewed.  No acute fracture.  Shoulder is located.  Acromiohumeral distance is  normal.  No significant degenerative changes in the glenohumeral or AC joint.  Visualized lung fields clear.     PMFS History: Patient Active Problem List   Diagnosis Date Noted   Nontraumatic complete tear of right rotator cuff    Degenerative superior labral anterior-to-posterior (SLAP) tear of right shoulder    Coronary artery calcification    Hyperlipidemia    Biceps tendonitis on right    Rotator cuff arthropathy 02/16/2019   FHx: colon  cancer 12/18/2018   Attention deficit disorder (ADD) in adult 11/10/2017   Moderate episode of recurrent major depressive disorder ([name]) 08/31/2017   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/31/2017   Tobacco abuse 08/31/2017   History of substance abuse ([name]) 02/23/2017   Depression 02/02/2013   Past Medical History:  Diagnosis Date   ADD (attention deficit disorder)    Anxiety    Arthritis    feet, shoulders   Cancer ([name])    skin cancer   Carpal tunnel syndrome    Chronic dyspnea    COPD (chronic obstructive pulmonary disease) ([name])    Coronary artery calcification    noted on CT in 10/2020   Depression    Hyperlipidemia    Pre-diabetes    Tobacco abuse     Family History  Problem Relation Age of Onset   Cancer Mother    Dementia Mother    Diabetes Mother    Heart Problems Mother    Heart disease Mother    Liver cancer Father    Colon cancer Father    Alcohol abuse Maternal Grandfather    Alcohol abuse Paternal Grandfather    Colon polyps Sister    Colon polyps Sister    Esophageal cancer Neg Hx    Stomach cancer Neg Hx    Rectal cancer Neg Hx     Past Surgical History:  Procedure Laterality Date   BICEPT TENODESIS Right 03/14/2022   Procedure: BICEPS TENODESIS;  Surgeon: Cammy Copa, MD;  [facility];  Service: Orthopedics;  Laterality: Right;   CARPAL TUNNEL RELEASE Right 11/13/2020   Procedure: RIGHT CARPAL TUNNEL RELEASE;  Surgeon: Cammy Copa, MD;  Location: [facility];  Service: Orthopedics;  Laterality: Right;   CHOLECYSTECTOMY     COLONOSCOPY     2000   GALLBLADDER SURGERY  2003   SHOULDER ARTHROSCOPY Left 02/16/2019   LEFT SHOULDER ARTHROSCOPY LOWER TRAPEZIUS TENDON TRANSFER, BICEPS TENODESIS   SHOULDER ARTHROSCOPY WITH DEBRIDEMENT AND BICEP TENDON REPAIR Left 02/16/2019   Procedure: LEFT SHOULDER ARTHROSCOPY LOWER TRAPEZIUS TENDON TRANSFER, BICEPS TENODESIS;  Surgeon: Cammy Copa, MD;  Location: [facility] OR;  Service:  Orthopedics;  Laterality: Left;   SHOULDER ARTHROSCOPY WITH OPEN ROTATOR CUFF REPAIR AND DISTAL CLAVICLE ACROMINECTOMY Right 03/14/2022   Procedure: RIGHT SHOULDER ARTHROSCOPY, DEBRIDEMENT, MINI OPEN ROTATOR CUFF TEAR REPAIR;  Surgeon: Cammy Copa, MD;  Location: [facility] OR;  Service: Orthopedics;  Laterality: Right;   SKIN BIOPSY  2019   Social History   Occupational History   Not on file  Tobacco Use   Smoking status: Every Day    Current packs/day: 1.00    Average packs/day: 1 pack/day for 50.0 years (50.0 ttl pk-yrs)    Types: Cigarettes   Smokeless tobacco: Never  Vaping Use   Vaping status: Former  Substance and Sexual Activity   Alcohol use: No   Drug use: No    Frequency: 7.0 times per week    Types: IV    Comment: history of  Rx opoid dependency   Sexual activity: Not Currently

## 2023-10-17 ENCOUNTER — Other Ambulatory Visit: Payer: Self-pay

## 2023-10-17 DIAGNOSIS — M5412 Radiculopathy, cervical region: Secondary | ICD-10-CM

## 2023-10-29 ENCOUNTER — Encounter: Payer: Self-pay | Admitting: Podiatry

## 2023-10-29 ENCOUNTER — Ambulatory Visit: Admitting: Podiatry

## 2023-10-29 DIAGNOSIS — M7751 Other enthesopathy of right foot: Secondary | ICD-10-CM

## 2023-10-29 DIAGNOSIS — M7752 Other enthesopathy of left foot: Secondary | ICD-10-CM | POA: Diagnosis not present

## 2023-10-29 DIAGNOSIS — B353 Tinea pedis: Secondary | ICD-10-CM

## 2023-10-29 MED ORDER — MELOXICAM 15 MG PO TABS: 15.0000 mg | ORAL_TABLET | Freq: Every day | ORAL | 1 refills | Status: AC

## 2023-10-29 MED ORDER — CLOTRIMAZOLE-BETAMETHASONE 1-0.05 % EX CREA: 1.0000 | TOPICAL_CREAM | Freq: Every day | CUTANEOUS | 2 refills | Status: AC

## 2023-10-30 DIAGNOSIS — M7751 Other enthesopathy of right foot: Secondary | ICD-10-CM | POA: Diagnosis not present

## 2023-10-30 DIAGNOSIS — M7752 Other enthesopathy of left foot: Secondary | ICD-10-CM | POA: Diagnosis not present

## 2023-10-30 MED ORDER — BETAMETHASONE SOD PHOS & ACET 6 (3-3) MG/ML IJ SUSP
3.0000 mg | Freq: Once | INTRAMUSCULAR | Status: AC
  Administered 2023-10-30: 3 mg via INTRA_ARTICULAR

## 2023-10-30 NOTE — Progress Notes (Signed)
   Chief Complaint  Patient presents with   Toe Pain    RM#8 Both big toe pain patient states he is in need of injections .    HPI: 63 y.o. male presenting today for follow-up evaluation of bilateral forefoot pain.  Last seen in the office 01/07/2022.  At that time injections were administered to bilateral great toes and he felt significantly better for several months.  Just over the last few months he has had increased pain and tenderness to the foot.  Presenting for further treatment evaluation  Past Medical History:  Diagnosis Date   ADD (attention deficit disorder)    Anxiety    Arthritis    feet, shoulders   Cancer ([name])    skin cancer   Carpal tunnel syndrome    Chronic dyspnea    COPD (chronic obstructive pulmonary disease) ([name])    Coronary artery calcification    noted on CT in 10/2020   Depression    Hyperlipidemia    Pre-diabetes    Tobacco abuse      Physical Exam: General: The patient is alert and oriented x3 in no acute distress.  Dermatology: Skin is warm, dry and supple bilateral lower extremities. Negative for open lesions or macerations.  Hyperkeratosis with inflammation of the skin noted to the right foot.  No open wounds.  Clinically consistent with tinea pedis  Vascular: Palpable pedal pulses bilaterally. No edema or erythema noted. Capillary refill within normal limits.  Neurological: Epicritic and protective threshold grossly intact bilaterally.   Musculoskeletal Exam: Range of motion within normal limits to all pedal and ankle joints bilateral. Muscle strength 5/5 in all groups bilateral.  Clinical evidence of a hallux limitus with enlargement of the first MTP joint and lateral deviation of the fifth metatarsal head consistent with a tailor's bunion deformity  Radiographic Exam 06/25/2021:  Normal osseous mineralization.  Advanced degenerative changes with periarticular spurring noted to the first MTP joint bilateral.  Increased intermetatarsal space  between the fourth and fifth metatarsals with lateral deviation of the fifth metatarsal head noted to the right foot  Assessment: 1.  Degenerative hallux limitus/DJD bilateral 2.  Tailor's bunion right 3.  Tinea pedis right foot   Plan of Care:  -Patient evaluated. X-Rays reviewed.  -Injection of 0.5 cc Celestone Soluspan injection of the first MTP bilateral -Continue diclofenac 75 mg twice daily as needed -Continue good supportive tennis shoes and sneakers -Prescription for Lotrisone cream to apply to the right foot twice daily -Return to clinic as needed  *Public affairs consultant     Felecia Shelling, DPM [facility]  Dr. Felecia Shelling, DPM    [address] [place], [place] [address]                Office [phone]  Fax [phone]

## 2023-11-05 ENCOUNTER — Other Ambulatory Visit: Payer: Self-pay | Admitting: Family Medicine

## 2023-11-05 DIAGNOSIS — F988 Other specified behavioral and emotional disorders with onset usually occurring in childhood and adolescence: Secondary | ICD-10-CM

## 2023-11-06 NOTE — Telephone Encounter (Signed)
 Medication discussed at his January 22 visit.  Adderall 15 mg in the morning, half dose in the afternoon.Controlled substance database reviewed.  Last filled #45 on 09/25/2023, previously 08/07/2023, refill ordered.

## 2023-11-06 NOTE — Telephone Encounter (Signed)
 Requested Prescriptions   Pending Prescriptions Disp Refills   amphetamine-dextroamphetamine (ADDERALL) 15 MG tablet [Pharmacy Med Name: Amphetamine-Dextroamphetamine Oral Tablet 15 MG] 45 tablet 0    Sig: TAKE ONE TABLET BY MOUTH ONCE A DAY AND TAKE ADDITIONAL HALF TABLET IN THE AFTERNOON IF NEEDED     Date of patient request: 11/06/2023 Last office visit: 08/20/2023 Upcoming visit: 02/26/2024 Date of last refill: 09/24/2023 Last refill amount: 45

## 2023-12-19 ENCOUNTER — Other Ambulatory Visit: Payer: Self-pay | Admitting: Family Medicine

## 2023-12-19 DIAGNOSIS — F988 Other specified behavioral and emotional disorders with onset usually occurring in childhood and adolescence: Secondary | ICD-10-CM

## 2023-12-19 NOTE — Telephone Encounter (Signed)
 Controlled substance database reviewed.  Dextroamphetamine  dextroamphetamine  15 mg #45 last filled on 11/06/2023, previously 09/25/2023.  Medication discussed in January.  Refill ordered.

## 2023-12-19 NOTE — Telephone Encounter (Signed)
 Medication: Adderall Directions: TAKE ONE TABLET BY MOUTH ONCE A DAY AND TAKE ADDITIONAL HALF TABLET IN THE AFTERNOON IF NEEDED  Last given: 11/06/2023 Number refills: 0 Last o/v: 08/20/2023 Follow up: I can't find this in last o/v note.  Labs: 08/20/2023   Please review refill request and approve/deny as necessary.

## 2024-02-06 ENCOUNTER — Other Ambulatory Visit: Payer: Self-pay | Admitting: Family Medicine

## 2024-02-06 DIAGNOSIS — F988 Other specified behavioral and emotional disorders with onset usually occurring in childhood and adolescence: Secondary | ICD-10-CM

## 2024-02-06 DIAGNOSIS — E785 Hyperlipidemia, unspecified: Secondary | ICD-10-CM

## 2024-02-09 NOTE — Telephone Encounter (Signed)
 Requested Prescriptions   Pending Prescriptions Disp Refills   amphetamine -dextroamphetamine  (ADDERALL) 15 MG tablet [Pharmacy Med Name: Amphetamine -Dextroamphetamine  Oral Tablet 15 MG] 45 tablet 0    Sig: TAKE ONE TABLET BY MOUTH ONCE DAILY AND TAKE ADDITIONAL HALF TABLET IN THE AFTERNOON IF NEEDED   rosuvastatin  (CRESTOR ) 40 MG tablet [Pharmacy Med Name: Rosuvastatin  Calcium  Oral Tablet 40 MG] 90 tablet 0    Sig: Take 1 tablet (40 mg total) by mouth at bedtime.     Date of patient request: 02/06/2024 Last office visit: 08/20/2023 Upcoming visit: Visit date not found Date of last refill: 12/19/2023 Last refill amount: 45

## 2024-02-10 NOTE — Telephone Encounter (Signed)
 Controlled substance database reviewed.  Dextroamphetamine -amphetamine  No. 45 last filled on 12/19/2023, previously 11/06/2023.  No concerns.  Office visit in January, meds discussed at that time.  Refill ordered.

## 2024-02-26 ENCOUNTER — Encounter: Payer: BC Managed Care – PPO | Admitting: Family Medicine

## 2024-03-31 ENCOUNTER — Other Ambulatory Visit: Payer: Self-pay | Admitting: Family Medicine

## 2024-03-31 DIAGNOSIS — F988 Other specified behavioral and emotional disorders with onset usually occurring in childhood and adolescence: Secondary | ICD-10-CM

## 2024-03-31 DIAGNOSIS — B009 Herpesviral infection, unspecified: Secondary | ICD-10-CM

## 2024-03-31 NOTE — Telephone Encounter (Signed)
 Requested Prescriptions   Pending Prescriptions Disp Refills   amphetamine -dextroamphetamine  (ADDERALL) 15 MG tablet [Pharmacy Med Name: Amphetamine -Dextroamphetamine  Oral Tablet 15 MG] 45 tablet 0    Sig: TAKE ONE TABLET BY MOUTH ONCE DAILY AND TAKE ADDITIONAL HALF TABLET IN THE AFTERNOON IF NEEDED   acyclovir  (ZOVIRAX ) 400 MG tablet [Pharmacy Med Name: Acyclovir  Oral Tablet 400 MG] 180 tablet 0    Sig: Take 1 tablet (400 mg total) by mouth 2 (two) times daily.     Date of patient request: 03/31/24 Last office visit: 08/20/2023 Upcoming visit: Visit date not found Date of last refill: 02/10/24 Last refill amount: 30 tablets  Rx will be put on hold until ready to refill.

## 2024-04-01 NOTE — Telephone Encounter (Signed)
 Depression/anxiety, ADD discussed at his January visit.  Stable control of symptoms at that time.  35-month follow-up recommended. Controlled substance database reviewed, last filled #45 on 02/10/2024, previously 12/19/2023. Looks like he was scheduled July 31 but that appointment had to be canceled, I do not see where it was rescheduled.  I will refill medications, but please schedule appointment in the next month if possible.  Thanks.

## 2024-04-02 NOTE — Telephone Encounter (Addendum)
 LM to call back to reschedule physical and to schedule an office visit to continue receiving refills on medication Also sent MyChart message

## 2024-04-05 ENCOUNTER — Encounter: Payer: Self-pay | Admitting: Family Medicine

## 2024-04-05 NOTE — Telephone Encounter (Signed)
 LMTCB to make an appt to continue refills

## 2024-04-06 NOTE — Telephone Encounter (Signed)
 Called, again no answer, sending letter

## 2024-04-23 ENCOUNTER — Encounter: Payer: Self-pay | Admitting: Family Medicine

## 2024-04-23 ENCOUNTER — Ambulatory Visit: Admitting: Family Medicine

## 2024-04-23 VITALS — BP 120/66 | HR 80 | Temp 98.1°F | Resp 18 | Ht 70.0 in | Wt 194.2 lb

## 2024-04-23 DIAGNOSIS — Z1211 Encounter for screening for malignant neoplasm of colon: Secondary | ICD-10-CM

## 2024-04-23 DIAGNOSIS — Z122 Encounter for screening for malignant neoplasm of respiratory organs: Secondary | ICD-10-CM

## 2024-04-23 DIAGNOSIS — B009 Herpesviral infection, unspecified: Secondary | ICD-10-CM

## 2024-04-23 DIAGNOSIS — F332 Major depressive disorder, recurrent severe without psychotic features: Secondary | ICD-10-CM

## 2024-04-23 DIAGNOSIS — F17210 Nicotine dependence, cigarettes, uncomplicated: Secondary | ICD-10-CM

## 2024-04-23 DIAGNOSIS — F413 Other mixed anxiety disorders: Secondary | ICD-10-CM

## 2024-04-23 DIAGNOSIS — E785 Hyperlipidemia, unspecified: Secondary | ICD-10-CM | POA: Diagnosis not present

## 2024-04-23 DIAGNOSIS — F988 Other specified behavioral and emotional disorders with onset usually occurring in childhood and adolescence: Secondary | ICD-10-CM

## 2024-04-23 LAB — LIPID PANEL
Cholesterol: 245 mg/dL — ABNORMAL HIGH (ref 0–200)
HDL: 29.5 mg/dL — ABNORMAL LOW (ref >39.00)
LDL Cholesterol: 151 mg/dL — ABNORMAL HIGH (ref 0–99)
NonHDL: 215.31
Total CHOL/HDL Ratio: 8
Triglycerides: 322 mg/dL — ABNORMAL HIGH (ref 0.0–149.0)
VLDL: 64.4 mg/dL — ABNORMAL HIGH (ref 0.0–40.0)

## 2024-04-23 LAB — COMPREHENSIVE METABOLIC PANEL WITH GFR
ALT: 41 U/L (ref 0–53)
AST: 18 U/L (ref 0–37)
Albumin: 4.2 g/dL (ref 3.5–5.2)
Alkaline Phosphatase: 86 U/L (ref 39–117)
BUN: 12 mg/dL (ref 6–23)
CO2: 29 meq/L (ref 19–32)
Calcium: 9.9 mg/dL (ref 8.4–10.5)
Chloride: 102 meq/L (ref 96–112)
Creatinine, Ser: 1.19 mg/dL (ref 0.40–1.50)
GFR: 65.49 mL/min (ref >60.00)
Glucose, Bld: 92 mg/dL (ref 70–99)
Potassium: 4.2 meq/L (ref 3.5–5.1)
Sodium: 136 meq/L (ref 135–145)
Total Bilirubin: 0.5 mg/dL (ref 0.2–1.2)
Total Protein: 7.1 g/dL (ref 6.0–8.3)

## 2024-04-23 MED ORDER — ROSUVASTATIN CALCIUM 40 MG PO TABS: 40.0000 mg | ORAL_TABLET | Freq: Every day | ORAL | 0 refills | Status: AC

## 2024-04-23 MED ORDER — ACYCLOVIR 400 MG PO TABS
400.0000 mg | ORAL_TABLET | Freq: Two times a day (BID) | ORAL | 0 refills | Status: AC
Start: 2024-04-23 — End: ?

## 2024-04-23 NOTE — Patient Instructions (Signed)
 I will check your cholesterol levels again today and we can decide if any changes needed.  Would like to see those numbers much lower.  Continue Crestor  same dose for now.  We will refer you for colonoscopy and lung cancer screening.  Information below on quitting smoking.  Let us  know if we can help further.  No change in Adderall dose at this time, please follow-up to discuss further if you feel this medication is ineffective at current dose.  Return to the clinic or go to the nearest emergency room if any of your symptoms worsen or new symptoms occur.  Managing the Challenge of Quitting Smoking Quitting smoking is a physical and mental challenge. You may have cravings, withdrawal symptoms, and temptation to smoke. Before quitting, work with your health care provider to make a plan that can help you manage quitting. Making a plan before you quit may keep you from smoking when you have the urge to smoke while trying to quit. How to manage lifestyle changes Managing stress Stress can make you want to smoke, and wanting to smoke may cause stress. It is important to find ways to manage your stress. You could try some of the following: Practice relaxation techniques. Breathe slowly and deeply, in through your nose and out through your mouth. Listen to music. Soak in a bath or take a shower. Imagine a peaceful place or vacation. Get some support. Talk with family or friends about your stress. Join a support group. Talk with a counselor or therapist. Get some physical activity. Go for a walk, run, or bike ride. Play a favorite sport. Practice yoga.  Medicines Talk with your health care provider about medicines that might help you deal with cravings and make quitting easier for you. Relationships Social situations can be difficult when you are quitting smoking. To manage this, you can: Avoid parties and other social situations where people might be smoking. Avoid alcohol. Leave right away if  you have the urge to smoke. Explain to your family and friends that you are quitting smoking. Ask for support and let them know you might be a bit grumpy. Plan activities where smoking is not an option. General instructions Be aware that many people gain weight after they quit smoking. However, not everyone does. To keep from gaining weight, have a plan in place before you quit, and stick to the plan after you quit. Your plan should include: Eating healthy snacks. When you have a craving, it may help to: Eat popcorn, or try carrots, celery, or other cut vegetables. Chew sugar-free gum. Changing how you eat. Eat small portion sizes at meals. Eat 4-6 small meals throughout the day instead of 1-2 large meals a day. Be mindful when you eat. You should avoid watching television or doing other things that might distract you as you eat. Exercising regularly. Make time to exercise each day. If you do not have time for a long workout, do short bouts of exercise for 5-10 minutes several times a day. Do some form of strengthening exercise, such as weight lifting. Do some exercise that gets your heart beating and causes you to breathe deeply, such as walking fast, running, swimming, or biking. This is very important. Drinking plenty of water  or other low-calorie or no-calorie drinks. Drink enough fluid to keep your urine pale yellow.  How to recognize withdrawal symptoms Your body and mind may experience discomfort as you try to get used to not having nicotine  in your system. These effects are  called withdrawal symptoms. They may include: Feeling hungrier than normal. Having trouble concentrating. Feeling irritable or restless. Having trouble sleeping. Feeling depressed. Craving a cigarette. These symptoms may surprise you, but they are normal to have when quitting smoking. To manage withdrawal symptoms: Avoid places, people, and activities that trigger your cravings. Remember why you want to  quit. Get plenty of sleep. Avoid coffee and other drinks that contain caffeine. These may worsen some of your symptoms. How to manage cravings Come up with a plan for how to deal with your cravings. The plan should include the following: A definition of the specific situation you want to deal with. An activity or action you will take to replace smoking. A clear idea for how this action will help. The name of someone who could help you with this. Cravings usually last for 5-10 minutes. Consider taking the following actions to help you with your plan to deal with cravings: Keep your mouth busy. Chew sugar-free gum. Suck on hard candies or a straw. Brush your teeth. Keep your hands and body busy. Change to a different activity right away. Squeeze or play with a ball. Do an activity or a hobby, such as making bead jewelry, practicing needlepoint, or working with wood. Mix up your normal routine. Take a short exercise break. Go for a quick walk, or run up and down stairs. Focus on doing something kind or helpful for someone else. Call a friend or family member to talk during a craving. Join a support group. Contact a quitline. Where to find support To get help or find a support group: Call the National [employer] Institute's Smoking Quitline: 1-800-QUIT-NOW (509)667-5609) Text QUIT to SmokefreeTXT: 521151 Where to find more information Visit these websites to find more information on quitting smoking: [place]. Department of Health and Human Services: www.smokefree.gov American Lung Association: www.freedomfromsmoking.org Centers for Disease Control and Prevention (CDC): FootballExhibition.com.br American Heart Association: www.heart.org Contact a health care provider if: You want to change your plan for quitting. The medicines you are taking are not helping. Your eating feels out of control or you cannot sleep. You feel depressed or become very anxious. Summary Quitting smoking is a physical and mental  challenge. You will face cravings, withdrawal symptoms, and temptation to smoke again. Preparation can help you as you go through these challenges. Try different techniques to manage stress, handle social situations, and prevent weight gain. You can deal with cravings by keeping your mouth busy (such as by chewing gum), keeping your hands and body busy, calling family or friends, or contacting a quitline for people who want to quit smoking. You can deal with withdrawal symptoms by avoiding places where people smoke, getting plenty of rest, and avoiding drinks that contain caffeine. This information is not intended to replace advice given to you by your health care provider. Make sure you discuss any questions you have with your health care provider. Document Revised: 07/06/2021 Document Reviewed: 07/06/2021 Elsevier Patient Education  2024 [employer].

## 2024-04-23 NOTE — Progress Notes (Signed)
 Subjective:  Patient ID: Louis Zimmerman, male    DOB: 02/02/1961  Age: 62 y.o. MRN: [id number]  CC:  Chief Complaint  Patient presents with   Hyperlipidemia    Medications refills. No questions or concerns.     HPI Louis Zimmerman presents for   Hyperlipidemia: Crestor  40mg  every day. Misses once or twice per week at the most. No recent labs. Coronary artery calcification - has seen cardiology prior.  No CP/DOE Lab Results  Component Value Date   CHOL 256 (H) 08/20/2023   HDL 32.70 (L) 08/20/2023   LDLCALC 162 (H) 08/20/2023   LDLDIRECT 87.0 02/13/2023   TRIG 307.0 (H) 08/20/2023   CHOLHDL 8 08/20/2023   Lab Results  Component Value Date   ALT 34 08/20/2023   AST 20 08/20/2023   ALKPHOS 74 08/20/2023   BILITOT 0.4 08/20/2023   Foot neuropathy: Treated with gabapentin  300mg  BID - stable, no new side effects.   Nicotine  addiction: Thoughts of quitting - has Chantix .  5-6 cigs per day.  Agrees to referral for lung CA screening.  Also due for colon ca screening - agrees to referral.   Depression, anxiety, ADD: Celexa  40mg  every day. Working long hours. Mood stable. Relationship going well.  Would like to stay at same dose adderall. 15mg  in morning, sometimes 1/2 dose in afternoon. Meds are helping. No CP, palpitations. Sleep is stable. Appetite ok. No new side effects.  Controlled substance database (PDMP) reviewed. No concerns appreciated. Last filled #45 on 04/02/24. Prior 02/10/24.   Needs refill of acyclovir  - rx stolen recently - needs rf.       04/23/2024   10:08 AM 08/20/2023    8:23 AM 01/09/2023    1:33 PM 06/03/2022    8:43 AM 12/03/2021    9:10 AM  Depression screen PHQ 2/9  Decreased Interest 0 0 0 0 0  Down, Depressed, Hopeless 0 0 0 0 0  PHQ - 2 Score 0 0 0 0 0  Altered sleeping 0 0 0 0 0  Tired, decreased energy 0 0 0 2 0  Change in appetite 0 0 0 0 0  Feeling bad or failure about yourself  0 0 0 0 0  Trouble concentrating 0 0 0 0 1  Moving  slowly or fidgety/restless 0 0 0 0 0  Suicidal thoughts 0  0 0 0  PHQ-9 Score 0 0 0 2 1  Difficult doing work/chores Not difficult at all          04/23/2024   10:08 AM 01/09/2023    1:33 PM 11/24/2020    8:27 AM  GAD 7 : Generalized Anxiety Score  Nervous, Anxious, on Edge 0 0 1  Control/stop worrying 0 0 1  Worry too much - different things 0 0 2  Trouble relaxing 0 0 0  Restless 0 0 0  Easily annoyed or irritable 0 0 1  Afraid - awful might happen 0 0 0  Total GAD 7 Score 0 0 5  Anxiety Difficulty Not difficult at all          History Patient Active Problem List   Diagnosis Date Noted   Basal cell carcinoma of skin 04/01/2023   Herpes simplex 04/01/2023   Vitamin D  deficiency 04/01/2023   Nontraumatic complete tear of right rotator cuff    Degenerative superior labral anterior-to-posterior (SLAP) tear of right shoulder    Coronary artery calcification    Hyperlipidemia    Biceps tendonitis on  right    Rotator cuff arthropathy 02/16/2019   FHx: colon cancer 12/18/2018   Attention deficit disorder (ADD) in adult 11/10/2017   Moderate episode of recurrent major depressive disorder ([name]) 08/31/2017   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/31/2017   Tobacco abuse 08/31/2017   History of substance abuse ([name]) 02/23/2017   Depression 02/02/2013   Past Medical History:  Diagnosis Date   ADD (attention deficit disorder)    Anxiety    Arthritis    feet, shoulders   Cancer ([name])    skin cancer   Carpal tunnel syndrome    Chronic dyspnea    COPD (chronic obstructive pulmonary disease) ([name])    Coronary artery calcification    noted on CT in 10/2020   Depression    Hyperlipidemia    Pre-diabetes    Tobacco abuse    Past Surgical History:  Procedure Laterality Date   BICEPT TENODESIS Right 03/14/2022   Procedure: BICEPS TENODESIS;  Surgeon: Addie Cordella Hamilton, MD;  Location: [facility] OR;  Service: Orthopedics;  Laterality: Right;   CARPAL TUNNEL RELEASE Right 11/13/2020    Procedure: RIGHT CARPAL TUNNEL RELEASE;  Surgeon: Addie Cordella Hamilton, MD;  Location: [facility];  Service: Orthopedics;  Laterality: Right;   CHOLECYSTECTOMY     COLONOSCOPY     2000   GALLBLADDER SURGERY  2003   SHOULDER ARTHROSCOPY Left 02/16/2019   LEFT SHOULDER ARTHROSCOPY LOWER TRAPEZIUS TENDON TRANSFER, BICEPS TENODESIS   SHOULDER ARTHROSCOPY WITH DEBRIDEMENT AND BICEP TENDON REPAIR Left 02/16/2019   Procedure: LEFT SHOULDER ARTHROSCOPY LOWER TRAPEZIUS TENDON TRANSFER, BICEPS TENODESIS;  Surgeon: Addie Cordella Hamilton, MD;  Location: [facility] OR;  Service: Orthopedics;  Laterality: Left;   SHOULDER ARTHROSCOPY WITH OPEN ROTATOR CUFF REPAIR AND DISTAL CLAVICLE ACROMINECTOMY Right 03/14/2022   Procedure: RIGHT SHOULDER ARTHROSCOPY, DEBRIDEMENT, MINI OPEN ROTATOR CUFF TEAR REPAIR;  Surgeon: Addie Cordella Hamilton, MD;  Location: [facility] OR;  Service: Orthopedics;  Laterality: Right;   SKIN BIOPSY  2019   Allergies  Allergen Reactions   Hydrocodone  Anaphylaxis   Zoloft  [Sertraline ] Other (See Comments)    sedating   Prior to Admission medications   Medication Sig Start Date End Date Taking? Authorizing Provider  acyclovir  (ZOVIRAX ) 400 MG tablet Take 1 tablet (400 mg total) by mouth 2 (two) times daily. 04/01/24  Yes Levora Reyes SAUNDERS, MD  amphetamine -dextroamphetamine  (ADDERALL) 15 MG tablet TAKE ONE TABLET BY MOUTH ONCE DAILY AND TAKE ADDITIONAL HALF TABLET IN THE AFTERNOON IF NEEDED 04/01/24  Yes Levora Reyes SAUNDERS, MD  aspirin  EC 81 MG tablet Take 1 tablet (81 mg total) by mouth daily. Swallow whole. Patient taking differently: Take 81 mg by mouth every evening. Swallow whole. 05/28/21  Yes O'Neal, Darryle Ned, MD  clotrimazole -betamethasone  (LOTRISONE ) cream Apply 1 Application topically daily. 10/29/23  Yes Janit Thresa HERO, DPM  losartan (COZAAR) 50 MG tablet Take 50 mg by mouth daily. 06/19/23  Yes [provider]  rosuvastatin  (CRESTOR ) 40 MG tablet Take 1 tablet (40 mg total)  by mouth at bedtime. 02/10/24 05/10/24 Yes Levora Reyes SAUNDERS, MD  varenicline  (CHANTIX  CONTINUING MONTH PAK) 1 MG tablet Take 1 tablet (1 mg total) by mouth 2 (two) times daily. 08/20/23  Yes Levora Reyes SAUNDERS, MD  diclofenac  (VOLTAREN ) 75 MG EC tablet TAKE 1 TABLET BY MOUTH TWICE A DAY Patient not taking: Reported on 04/23/2024 04/10/22   Magnant, Carlin CROME, PA-C  gabapentin  (NEURONTIN ) 300 MG capsule Take 1 capsule (300 mg total) by mouth every morning AND 2  capsules (600 mg total) at bedtime. Patient not taking: No sig reported 06/03/22   Levora Reyes SAUNDERS, MD  methocarbamol  (ROBAXIN ) 500 MG tablet Take 1 tablet (500 mg total) by mouth every 8 (eight) hours as needed for muscle spasms. Patient not taking: Reported on 04/23/2024 03/14/22   Magnant, Carlin CROME, PA-C  Varenicline  Tartrate, Starter, (CHANTIX  STARTING MONTH PAK) 0.5 MG X 11 & 1 MG X 42 TBPK Take one 0.5 mg tablet by mouth once daily for 3 days, then increase to one 0.5 mg tablet twice daily for 4 days, then increase to one 1 mg tablet twice daily. Patient not taking: Reported on 04/23/2024 08/20/23   Levora Reyes SAUNDERS, MD   Social History   Socioeconomic History   Marital status: Single    Spouse name: Not on file   Number of children: 3   Years of education: Not on file   Highest education level: Not on file  Occupational History   Not on file  Tobacco Use   Smoking status: Every Day    Current packs/day: 1.00    Average packs/day: 1 pack/day for 50.0 years (50.0 ttl pk-yrs)    Types: Cigarettes   Smokeless tobacco: Never  Vaping Use   Vaping status: Former  Substance and Sexual Activity   Alcohol use: No   Drug use: No    Frequency: 7.0 times per week    Types: IV    Comment: history of Rx opoid dependency   Sexual activity: Not Currently  Other Topics Concern   Not on file  Social History Narrative   Not on file   Social Drivers of Health   Financial Resource Strain: Not on file  Food Insecurity: Not on file   Transportation Needs: Not on file  Physical Activity: Not on file  Stress: Not on file  Social Connections: Not on file  Intimate Partner Violence: Not on file    Review of Systems  Constitutional:  Negative for fatigue and unexpected weight change.  Eyes:  Negative for visual disturbance.  Respiratory:  Negative for cough, chest tightness and shortness of breath.   Cardiovascular:  Negative for chest pain, palpitations and leg swelling.  Gastrointestinal:  Negative for abdominal pain and blood in stool.  Neurological:  Negative for dizziness, light-headedness and headaches.     Objective:   Vitals:   04/23/24 1002 04/23/24 1032  BP: (!) 140/86 120/66  Pulse: 80   Resp: 18   Temp: 98.1 F (36.7 C)   TempSrc: Temporal   SpO2: 96%   Weight: 194 lb 3.2 oz (88.1 kg)   Height: 5' 10 (1.778 m)      Physical Exam Vitals reviewed.  Constitutional:      Appearance: He is well-developed.  HENT:     Head: Normocephalic and atraumatic.  Neck:     Vascular: No carotid bruit or JVD.  Cardiovascular:     Rate and Rhythm: Normal rate and regular rhythm.     Heart sounds: Normal heart sounds. No murmur heard. Pulmonary:     Effort: Pulmonary effort is normal.     Breath sounds: Normal breath sounds. No rales.  Musculoskeletal:     Right lower leg: No edema.     Left lower leg: No edema.  Skin:    General: Skin is warm and dry.  Neurological:     Mental Status: He is alert and oriented to person, place, and time.  Psychiatric:        Mood  and Affect: Mood normal.        Assessment & Plan:  Louis Zimmerman is a 63 y.o. male . Hyperlipidemia, unspecified hyperlipidemia type - Plan: Lipid panel, Comprehensive metabolic panel with GFR, rosuvastatin  (CRESTOR ) 40 MG tablet  - Tolerating current dose of Crestor , labs with persistent elevated LDL.  Stressed importance of daily dosing and can recheck labs in the next few months to decide if any additions needed once he is  consistently dosing daily.  Only missing 1 to 2 days/week, so potentially may need to add meds at that time.  HSV infection - Plan: acyclovir  (ZOVIRAX ) 400 MG tablet Acyclovir  refilled  Screening for colon cancer - Plan: Amb Referral to Colonoscopy  Screening for lung cancer - Plan: Ambulatory Referral for Lung Cancer Screening [REF832]  Attention deficit disorder (ADD) in adult Stable with current regimen, will refill meds when due.  33-month follow-up.  Cigarette nicotine  dependence without complication - Plan: Ambulatory Referral for Lung Cancer Screening [REF832] Refer for lung cancer screening as above.  Smoking cessation discussed.  Handout given.  Discussed that once he is on full treatment dose of Chantix , should abstain from smoking.    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  Continue to monitor.  Meds ordered this encounter  Medications   acyclovir  (ZOVIRAX ) 400 MG tablet    Sig: Take 1 tablet (400 mg total) by mouth 2 (two) times daily.    Dispense:  180 tablet    Refill:  0   rosuvastatin  (CRESTOR ) 40 MG tablet    Sig: Take 1 tablet (40 mg total) by mouth at bedtime.    Dispense:  90 tablet    Refill:  0   Patient Instructions  I will check your cholesterol levels again today and we can decide if any changes needed.  Would like to see those numbers much lower.  Continue Crestor  same dose for now.  We will refer you for colonoscopy and lung cancer screening.  Information below on quitting smoking.  Let us  know if we can help further.  No change in Adderall dose at this time, please follow-up to discuss further if you feel this medication is ineffective at current dose.  Return to the clinic or go to the nearest emergency room if any of your symptoms worsen or new symptoms occur.  Managing the Challenge of Quitting Smoking Quitting  smoking is a physical and mental challenge. You may have cravings, withdrawal symptoms, and temptation to smoke. Before quitting, work with your health care provider to make a plan that can help you manage quitting. Making a plan before you quit may keep you from smoking when you have the urge to smoke while trying to quit. How to manage lifestyle changes Managing stress Stress can make you want to smoke, and wanting to smoke may cause stress. It is important to find ways to manage your stress. You could try some of the following: Practice relaxation techniques. Breathe slowly and deeply, in through your nose and out through your mouth. Listen to music. Soak in a bath or take a shower. Imagine a peaceful place or vacation. Get some support. Talk with family or friends about your stress. Join a support group. Talk with a counselor or therapist. Get some physical activity. Go for a walk, run, or bike ride. Play a favorite sport. Practice yoga.  Medicines Talk with your  health care provider about medicines that might help you deal with cravings and make quitting easier for you. Relationships Social situations can be difficult when you are quitting smoking. To manage this, you can: Avoid parties and other social situations where people might be smoking. Avoid alcohol. Leave right away if you have the urge to smoke. Explain to your family and friends that you are quitting smoking. Ask for support and let them know you might be a bit grumpy. Plan activities where smoking is not an option. General instructions Be aware that many people gain weight after they quit smoking. However, not everyone does. To keep from gaining weight, have a plan in place before you quit, and stick to the plan after you quit. Your plan should include: Eating healthy snacks. When you have a craving, it may help to: Eat popcorn, or try carrots, celery, or other cut vegetables. Chew sugar-free gum. Changing how you  eat. Eat small portion sizes at meals. Eat 4-6 small meals throughout the day instead of 1-2 large meals a day. Be mindful when you eat. You should avoid watching television or doing other things that might distract you as you eat. Exercising regularly. Make time to exercise each day. If you do not have time for a long workout, do short bouts of exercise for 5-10 minutes several times a day. Do some form of strengthening exercise, such as weight lifting. Do some exercise that gets your heart beating and causes you to breathe deeply, such as walking fast, running, swimming, or biking. This is very important. Drinking plenty of water  or other low-calorie or no-calorie drinks. Drink enough fluid to keep your urine pale yellow.  How to recognize withdrawal symptoms Your body and mind may experience discomfort as you try to get used to not having nicotine  in your system. These effects are called withdrawal symptoms. They may include: Feeling hungrier than normal. Having trouble concentrating. Feeling irritable or restless. Having trouble sleeping. Feeling depressed. Craving a cigarette. These symptoms may surprise you, but they are normal to have when quitting smoking. To manage withdrawal symptoms: Avoid places, people, and activities that trigger your cravings. Remember why you want to quit. Get plenty of sleep. Avoid coffee and other drinks that contain caffeine. These may worsen some of your symptoms. How to manage cravings Come up with a plan for how to deal with your cravings. The plan should include the following: A definition of the specific situation you want to deal with. An activity or action you will take to replace smoking. A clear idea for how this action will help. The name of someone who could help you with this. Cravings usually last for 5-10 minutes. Consider taking the following actions to help you with your plan to deal with cravings: Keep your mouth busy. Chew  sugar-free gum. Suck on hard candies or a straw. Brush your teeth. Keep your hands and body busy. Change to a different activity right away. Squeeze or play with a ball. Do an activity or a hobby, such as making bead jewelry, practicing needlepoint, or working with wood. Mix up your normal routine. Take a short exercise break. Go for a quick walk, or run up and down stairs. Focus on doing something kind or helpful for someone else. Call a friend or family member to talk during a craving. Join a support group. Contact a quitline. Where to find support To get help or find a support group: Call the National Cancer Institute's Smoking Quitline: 1-800-QUIT-NOW 929-831-9996)  Text QUIT to [facility]: 521151 Where to find more information Visit these websites to find more information on quitting smoking: U.S. Department of Health and Human Services: www.smokefree.gov American Lung Association: www.freedomfromsmoking.org Centers for Disease Control and Prevention (CDC): FootballExhibition.com.br American Heart Association: www.heart.org Contact a health care provider if: You want to change your plan for quitting. The medicines you are taking are not helping. Your eating feels out of control or you cannot sleep. You feel depressed or become very anxious. Summary Quitting smoking is a physical and mental challenge. You will face cravings, withdrawal symptoms, and temptation to smoke again. Preparation can help you as you go through these challenges. Try different techniques to manage stress, handle social situations, and prevent weight gain. You can deal with cravings by keeping your mouth busy (such as by chewing gum), keeping your hands and body busy, calling family or friends, or contacting a quitline for people who want to quit smoking. You can deal with withdrawal symptoms by avoiding places where people smoke, getting plenty of rest, and avoiding drinks that contain caffeine. This information is not  intended to replace advice given to you by your health care provider. Make sure you discuss any questions you have with your health care provider. Document Revised: 07/06/2021 Document Reviewed: 07/06/2021 Elsevier Patient Education  2024 Elsevier Inc.    Signed,   Reyes Pines, MD [facility], [facility] 04/23/24 10:54 AM

## 2024-04-25 ENCOUNTER — Ambulatory Visit: Payer: Self-pay | Admitting: Family Medicine

## 2024-05-29 ENCOUNTER — Other Ambulatory Visit: Payer: Self-pay | Admitting: Family Medicine

## 2024-05-29 DIAGNOSIS — F988 Other specified behavioral and emotional disorders with onset usually occurring in childhood and adolescence: Secondary | ICD-10-CM

## 2024-05-31 ENCOUNTER — Encounter: Payer: Self-pay | Admitting: Radiology

## 2024-05-31 NOTE — Telephone Encounter (Signed)
 Medication discussed in September 26 visit.  Controlled substance database reviewed.  #45 last filled on 04/02/2024.  Previously 02/10/2024.  Refill ordered

## 2024-07-30 ENCOUNTER — Other Ambulatory Visit: Payer: Self-pay | Admitting: Family Medicine

## 2024-07-30 DIAGNOSIS — F988 Other specified behavioral and emotional disorders with onset usually occurring in childhood and adolescence: Secondary | ICD-10-CM

## 2024-07-30 NOTE — Telephone Encounter (Signed)
 Requested Prescriptions   Pending Prescriptions Disp Refills   amphetamine -dextroamphetamine  (ADDERALL) 15 MG tablet [Pharmacy Med Name: Amphetamine -Dextroamphetamine  Oral Tablet 15 MG] 45 tablet 0    Sig: TAKE ONE TABLET BY MOUTH ONCE A DAY AND TAKE ADDITIONAL HALF TABLET IN THE AFTERNOON IF NEEDED     Date of patient request: 07/30/24 Last office visit: 04/23/2024 Upcoming visit: 09/13/2024 Date of last refill: 05/31/24 Last refill amount: 45

## 2024-07-31 NOTE — Telephone Encounter (Signed)
 Medication discussed at office visit 04/23/2024.  Controlled substance database reviewed.  #45 of dextroamphetamine -amphetamine  filled on 06/01/2024.  No concerns, refill ordered.

## 2024-09-13 ENCOUNTER — Encounter: Admitting: Family Medicine
